# Patient Record
Sex: Male | Born: 1955 | Race: White | Hispanic: No | Marital: Single | Smoking: Never smoker
Health system: Southern US, Community
[De-identification: ages and names within clinical notes are randomized; demographics above are authoritative.]

## PROBLEM LIST (undated history)

## (undated) DIAGNOSIS — I10 Essential (primary) hypertension: Secondary | ICD-10-CM

## (undated) DIAGNOSIS — E119 Type 2 diabetes mellitus without complications: Secondary | ICD-10-CM

## (undated) HISTORY — PX: ABDOMINAL SURGERY: SHX537

---

## 2010-06-21 ENCOUNTER — Ambulatory Visit: Payer: Self-pay | Admitting: Family Medicine

## 2010-07-26 ENCOUNTER — Ambulatory Visit: Payer: Self-pay | Admitting: Family Medicine

## 2010-07-26 ENCOUNTER — Encounter (INDEPENDENT_AMBULATORY_CARE_PROVIDER_SITE_OTHER): Payer: Self-pay | Admitting: *Deleted

## 2011-01-10 NOTE — Letter (Signed)
Summary: Pre Visit Letter Revised  [facility]  [address] [place], [place] [address]   Phone: [phone]  Fax: [phone]    07/26/2010 MRN: [id number]  Fernando Rhodes [address] [place], [place]  [address]              Procedure Date:  09/15/2010    Welcome to the Gastroenterology Division at [facility].    You are scheduled to see a nurse for your pre-procedure visit on 09/05/2010 at 10:30AM on the 3rd floor at [facility], [address]  We ask that you try to arrive at our office 15 minutes prior to your appointment time to allow for check-in.  Please take a minute to review the attached form.  If you answer "Yes" to one or more of the questions on the first page, we ask that you call the person listed at your earliest opportunity.  If you answer "No" to all of the questions, please complete the rest of the form and bring it to your appointment.    Your nurse visit will consist of discussing your medical and surgical history, your immediate family medical history, and your medications.    If you are unable to list all of your medications on the form, please bring the medication bottles to your appointment and we will list them.  We will need to be aware of both prescribed and over the counter drugs.  We will need to know exact dosage information as well.    Please be prepared to read and sign documents such as consent forms, a financial agreement, and acknowledgement forms.  If necessary, and with your consent, a friend or relative is welcome to sit-in on the nurse visit with you.  Please bring your insurance card so that we may make a copy of it.  If your insurance requires a referral to see a specialist, please bring your referral form from your primary care physician.  No co-pay is required for this nurse visit.     If you cannot keep your appointment, please call [phone] to cancel or reschedule prior to your appointment date.  This allows  [place] the opportunity to schedule an appointment for another patient in need of care.   Thank you for choosing [facility] for your medical needs.  We appreciate the opportunity to care for you.  Please visit [place] at our website  to learn more about our practice.                     Sincerely,  The Gastroenterology Division

## 2011-07-30 ENCOUNTER — Other Ambulatory Visit: Payer: Self-pay | Admitting: Family Medicine

## 2015-05-08 ENCOUNTER — Emergency Department (INDEPENDENT_AMBULATORY_CARE_PROVIDER_SITE_OTHER)
Admission: EM | Admit: 2015-05-08 | Discharge: 2015-05-08 | Disposition: A | Discharge status: Home / Self Care | Payer: 59 | Source: Home / Self Care | Attending: Family Medicine | Admitting: Family Medicine

## 2015-05-08 DIAGNOSIS — L0291 Cutaneous abscess, unspecified: Secondary | ICD-10-CM | POA: Diagnosis not present

## 2015-05-08 DIAGNOSIS — L0390 Cellulitis, unspecified: Secondary | ICD-10-CM

## 2015-05-08 MED ORDER — TETANUS-DIPHTH-ACELL PERTUSSIS 5-2.5-18.5 LF-MCG/0.5 IM SUSP
0.5000 mL | Freq: Once | INTRAMUSCULAR | Status: AC
  Administered 2015-05-08: 0.5 mL via INTRAMUSCULAR

## 2015-05-08 MED ORDER — TETANUS-DIPHTH-ACELL PERTUSSIS 5-2.5-18.5 LF-MCG/0.5 IM SUSP
INTRAMUSCULAR | Status: AC
  Filled 2015-05-08: qty 0.5

## 2015-05-08 MED ORDER — CEFTRIAXONE SODIUM 1 G IJ SOLR
INTRAMUSCULAR | Status: AC
  Filled 2015-05-08: qty 10

## 2015-05-08 MED ORDER — CEFTRIAXONE SODIUM 1 G IJ SOLR
1.0000 g | Freq: Once | INTRAMUSCULAR | Status: AC
  Administered 2015-05-08: 1 g via INTRAMUSCULAR

## 2015-05-08 MED ORDER — DOXYCYCLINE HYCLATE 100 MG PO CAPS: 100.0000 mg | ORAL_CAPSULE | Freq: Two times a day (BID) | ORAL | Status: DC

## 2015-05-08 MED ORDER — LIDOCAINE HCL (PF) 2 % IJ SOLN
INTRAMUSCULAR | Status: AC
  Filled 2015-05-08: qty 2

## 2015-05-08 NOTE — Discharge Instructions (Signed)

## 2015-05-08 NOTE — ED Provider Notes (Signed)
CSN: [id number]     Arrival date & time 05/08/15  1411 History   First MD Initiated Contact with Patient 05/08/15 1600     No chief complaint on file.  (Consider location/radiation/quality/duration/timing/severity/associated sxs/prior Treatment) Patient is a 59 y.o. male presenting with abscess. The history is provided by the patient and the spouse.  Abscess Location:  Hand Hand abscess location:  L hand Abscess quality: fluctuance, painful, redness and warmth   Abscess quality: no induration   Red streaking: no   Duration:  4 days Progression:  Worsening Pain details:    Quality:  Throbbing   Severity:  Moderate   Timing:  Constant Chronicity:  New Context: skin injury   Context: not diabetes and not immunosuppression   Relieved by:  None tried Worsened by:  Nothing tried Ineffective treatments:  None tried Associated symptoms: anorexia   Associated symptoms: no fatigue, no fever, no headaches, no nausea and no vomiting   Risk factors: no family hx of MRSA, no hx of MRSA and no prior abscess     No past medical history on file. No past surgical history on file. No family history on file. History  Substance Use Topics  . Smoking status: Not on file  . Smokeless tobacco: Not on file  . Alcohol Use: Not on file    Review of Systems  Constitutional: Negative for fever and fatigue.  Gastrointestinal: Positive for anorexia. Negative for nausea and vomiting.  Neurological: Negative for headaches.    Allergies  Review of patient's allergies indicates not on file.  Home Medications   Prior to Admission medications   Medication Sig Start Date End Date Taking? Authorizing Provider  doxycycline (VIBRAMYCIN) 100 MG capsule Take 1 capsule (100 mg total) by mouth 2 (two) times daily. 05/08/15   Tereasa Coop, PA-C  lisinopril-hydrochlorothiazide (PRINZIDE,ZESTORETIC) 20-12.5 MG per tablet TAKE ONE TABLET BY MOUTH EVERY DAY 07/30/11   Denita Lung, MD   There were no vitals  taken for this visit. Physical Exam  Constitutional: He is oriented to person, place, and time. He appears well-developed and well-nourished. No distress.  Cardiovascular: Normal rate and regular rhythm.   Pulmonary/Chest: Effort normal.  Abdominal: Soft.  Musculoskeletal:       Left hand: He exhibits normal range of motion and normal capillary refill. Normal sensation noted. Normal strength noted. He exhibits no finger abduction, no thumb/finger opposition and no wrist extension trouble.       Hands: Neurological: He is alert and oriented to person, place, and time.  Skin: Skin is warm and dry. He is not diaphoretic. There is erythema.  Psychiatric: He has a normal mood and affect.    ED Course  Procedures (including critical care time) Labs Review Labs Reviewed - No data to display  Imaging Review No results found.   MDM   1. Cellulitis and abscess    Patient with abscess and cellulitis of left middle finger.  He feels well and denies tenderness over the erythematous area. Will treat with 1 gram rocephin, doxy and Tdap.  He is to folow up with his PCP in two days for this problem. Philis Fendt, MS, PA-C   4:17 PM, 05/08/2015       Tereasa Coop, PA-C 05/08/15 [phone]

## 2016-03-16 ENCOUNTER — Ambulatory Visit (HOSPITAL_COMMUNITY)
Admission: EM | Admit: 2016-03-16 | Discharge: 2016-03-16 | Disposition: A | Discharge status: Home / Self Care | Payer: BLUE CROSS/BLUE SHIELD | Attending: Emergency Medicine | Admitting: Emergency Medicine

## 2016-03-16 ENCOUNTER — Encounter (HOSPITAL_COMMUNITY): Payer: Self-pay | Admitting: Emergency Medicine

## 2016-03-16 DIAGNOSIS — M7582 Other shoulder lesions, left shoulder: Secondary | ICD-10-CM

## 2016-03-16 DIAGNOSIS — M778 Other enthesopathies, not elsewhere classified: Secondary | ICD-10-CM

## 2016-03-16 HISTORY — DX: Essential (primary) hypertension: I10

## 2016-03-16 MED ORDER — DICLOFENAC SODIUM 1 % TD GEL: 2.0000 g | Freq: Four times a day (QID) | TRANSDERMAL | Status: DC

## 2016-03-16 MED ORDER — MELOXICAM 15 MG PO TABS: 15.0000 mg | ORAL_TABLET | Freq: Every day | ORAL | Status: DC

## 2016-03-16 MED ORDER — KETOROLAC TROMETHAMINE 60 MG/2ML IM SOLN
INTRAMUSCULAR | Status: AC
  Filled 2016-03-16: qty 2

## 2016-03-16 MED ORDER — KETOROLAC TROMETHAMINE 60 MG/2ML IM SOLN
60.0000 mg | Freq: Once | INTRAMUSCULAR | Status: AC
  Administered 2016-03-16: 60 mg via INTRAMUSCULAR

## 2016-03-16 MED ORDER — TRAMADOL HCL 50 MG PO TABS: 50.0000 mg | ORAL_TABLET | Freq: Four times a day (QID) | ORAL | Status: DC | PRN

## 2016-03-16 NOTE — ED Notes (Signed)
Pt has been suffering from left upper arm pain for a month.  Pt denies any injury to the arm.  He has limited ROM, but does not have issue with lifting things.  He has been using "an arthritis cream" on it with no relief.

## 2016-03-16 NOTE — ED Provider Notes (Signed)
CSN: [id number]     Arrival date & time 03/16/16  1740 History   First MD Initiated Contact with Patient 03/16/16 1813     Chief Complaint  Patient presents with  . Arm Pain   (Consider location/radiation/quality/duration/timing/severity/associated sxs/prior Treatment) HPI He is a 60 year old man here with his fiance for evaluation of left upper arm pain. He states this is been present for about a month now. It is not getting better or worse. It is located in the lateral upper arm. It is worse with overhead movements. He denies any pain in the shoulder itself or the elbow. No pain with elbow movement. No erythema or swelling. He has been using an over-the-counter arthritis cream without improvement. He denies any injury or trauma. No recent change in activity.  Past Medical History  Diagnosis Date  . Hypertension    Past Surgical History  Procedure Laterality Date  . Abdominal surgery     History reviewed. No pertinent family history. Social History  Substance Use Topics  . Smoking status: Never Smoker   . Smokeless tobacco: Never Used  . Alcohol Use: No    Review of Systems As in history of present illness Allergies  Review of patient's allergies indicates no known allergies.  Home Medications   Prior to Admission medications   Medication Sig Start Date End Date Taking? Authorizing Provider  lisinopril-hydrochlorothiazide (PRINZIDE,ZESTORETIC) 20-12.5 MG per tablet TAKE ONE TABLET BY MOUTH EVERY DAY 07/30/11  Yes Denita Lung, MD  diclofenac sodium (VOLTAREN) 1 % GEL Apply 2 g topically 4 (four) times daily. Left upper arm 03/16/16   Melony Overly, MD  meloxicam (MOBIC) 15 MG tablet Take 1 tablet (15 mg total) by mouth daily. For 1 week, then as needed for pain 03/16/16   Melony Overly, MD  traMADol (ULTRAM) 50 MG tablet Take 1 tablet (50 mg total) by mouth every 6 (six) hours as needed. 03/16/16   Melony Overly, MD   Meds Ordered and Administered this Visit   Medications   ketorolac (TORADOL) injection 60 mg (not administered)    BP 134/88 mmHg  Pulse 69  Temp(Src) 97.7 F (36.5 C) (Oral)  Resp 16  SpO2 95% No data found.   Physical Exam  Constitutional: He is oriented to person, place, and time. He appears well-developed. No distress.  Cardiovascular: Normal rate.   Pulmonary/Chest: Effort normal.  Musculoskeletal:  Left shoulder: No erythema or edema. No bony tenderness. No point tenderness within the shoulder. Positive empty can. Negative Hawkins. Normal range of motion, but pain with abduction beyond 90. Left arm: No erythema or swelling. No bruising. He is tender at the site of the deltoid insertion. 2+ radial pulse. Left elbow: No swelling or erythema. Full active range of motion without pain. No tenderness of the epicondyles.  Neurological: He is alert and oriented to person, place, and time.    ED Course  Procedures (including critical care time)  Labs Review Labs Reviewed - No data to display  Imaging Review No results found.    MDM   1. Deltoid tendonitis of left shoulder    Symptomatic treatment with relative rest, meloxicam, diclofenac gel. Prescription given for tramadol to use as needed for severe pain. Toradol given prior to discharge. Follow-up with orthopedics if not improving in 1-2 weeks.    Melony Overly, MD 03/16/16 [phone]

## 2016-03-16 NOTE — Discharge Instructions (Signed)
You have tendinitis of the deltoid ligament. Take meloxicam daily for the next week, then as needed. Start this medicine tomorrow. Put diclofenac gel on the arm 4 times a day for the next week. Avoid movements that cause pain. You should see some improvement by Monday or Tuesday, but this will likely take several weeks to fully resolve. If it is not improving in 1-2 weeks, follow-up with Dr. Percell Miller, an orthopedic specialist.

## 2016-07-27 DIAGNOSIS — I10 Essential (primary) hypertension: Secondary | ICD-10-CM | POA: Diagnosis not present

## 2016-07-27 DIAGNOSIS — C4491 Basal cell carcinoma of skin, unspecified: Secondary | ICD-10-CM | POA: Diagnosis not present

## 2016-07-27 DIAGNOSIS — R7309 Other abnormal glucose: Secondary | ICD-10-CM | POA: Diagnosis not present

## 2016-08-31 DIAGNOSIS — R7989 Other specified abnormal findings of blood chemistry: Secondary | ICD-10-CM | POA: Diagnosis not present

## 2016-08-31 DIAGNOSIS — R945 Abnormal results of liver function studies: Secondary | ICD-10-CM | POA: Diagnosis not present

## 2016-09-16 DIAGNOSIS — Z23 Encounter for immunization: Secondary | ICD-10-CM | POA: Diagnosis not present

## 2016-11-06 DIAGNOSIS — Z79899 Other long term (current) drug therapy: Secondary | ICD-10-CM | POA: Diagnosis not present

## 2016-11-06 DIAGNOSIS — E784 Other hyperlipidemia: Secondary | ICD-10-CM | POA: Diagnosis not present

## 2016-11-06 DIAGNOSIS — N183 Chronic kidney disease, stage 3 (moderate): Secondary | ICD-10-CM | POA: Diagnosis not present

## 2016-11-06 DIAGNOSIS — Z23 Encounter for immunization: Secondary | ICD-10-CM | POA: Diagnosis not present

## 2016-11-06 DIAGNOSIS — E119 Type 2 diabetes mellitus without complications: Secondary | ICD-10-CM | POA: Diagnosis not present

## 2016-11-27 ENCOUNTER — Ambulatory Visit (HOSPITAL_COMMUNITY)
Admission: EM | Admit: 2016-11-27 | Discharge: 2016-11-27 | Disposition: A | Discharge status: Home / Self Care | Payer: BLUE CROSS/BLUE SHIELD | Attending: Family Medicine | Admitting: Family Medicine

## 2016-11-27 ENCOUNTER — Encounter (HOSPITAL_COMMUNITY): Payer: Self-pay | Admitting: Emergency Medicine

## 2016-11-27 DIAGNOSIS — R197 Diarrhea, unspecified: Secondary | ICD-10-CM

## 2016-11-27 DIAGNOSIS — K529 Noninfective gastroenteritis and colitis, unspecified: Secondary | ICD-10-CM | POA: Diagnosis not present

## 2016-11-27 DIAGNOSIS — R112 Nausea with vomiting, unspecified: Secondary | ICD-10-CM

## 2016-11-27 HISTORY — DX: Type 2 diabetes mellitus without complications: E11.9

## 2016-11-27 MED ORDER — ONDANSETRON HCL 4 MG PO TABS: 4.0000 mg | ORAL_TABLET | Freq: Four times a day (QID) | ORAL | 0 refills | Status: DC

## 2016-11-27 MED ORDER — SODIUM CHLORIDE 0.9 % IV SOLN
Freq: Once | INTRAVENOUS | Status: AC
  Administered 2016-11-27 (×2): via INTRAVENOUS

## 2016-11-27 MED ORDER — HYDROMORPHONE HCL 1 MG/ML IJ SOLN: 1.0000 mg | Freq: Once | INTRAMUSCULAR | Status: DC

## 2016-11-27 MED ORDER — HYDROMORPHONE HCL 1 MG/ML IJ SOLN
1.0000 mg | Freq: Once | INTRAMUSCULAR | Status: AC
  Administered 2016-11-27: 1 mg via INTRAVENOUS

## 2016-11-27 MED ORDER — HYDROMORPHONE HCL 1 MG/ML IJ SOLN
INTRAMUSCULAR | Status: AC
  Filled 2016-11-27: qty 1

## 2016-11-27 MED ORDER — ONDANSETRON HCL 4 MG/2ML IJ SOLN
INTRAMUSCULAR | Status: AC
  Filled 2016-11-27: qty 2

## 2016-11-27 MED ORDER — ONDANSETRON HCL 4 MG/2ML IJ SOLN
4.0000 mg | Freq: Once | INTRAMUSCULAR | Status: AC
  Administered 2016-11-27: 4 mg via INTRAVENOUS

## 2016-11-27 NOTE — ED Triage Notes (Signed)
Vomiting and diarrhea started yesterday.  Patient feels like he is going to pass out.  One episode of vomiting and one episode of diarrhea today.  Has vomited throughout the night and had diarrhea.

## 2016-11-27 NOTE — ED Provider Notes (Signed)
[facility]    CSN: [id number] Arrival date & time: 11/27/16  1133     History   Chief Complaint Chief Complaint  Patient presents with  . Abdominal Pain    HPI Fernando Rhodes is a 60 y.o. male.   The history is provided by the patient and the spouse.  Abdominal Pain  Pain location:  Epigastric Pain quality: cramping   Pain radiates to:  Does not radiate Pain severity:  Mild Onset quality:  Sudden Duration:  1 day Progression:  Unchanged Chronicity:  New Context: not diet changes, not sick contacts and not suspicious food intake   Relieved by:  None tried Worsened by:  Nothing Ineffective treatments:  None tried Associated symptoms: diarrhea, nausea and vomiting   Associated symptoms: no fever, no hematemesis, no hematochezia and no hematuria     Past Medical History:  Diagnosis Date  . Diabetes mellitus without complication ([place])   . Hypertension     There are no active problems to display for this patient.   Past Surgical History:  Procedure Laterality Date  . ABDOMINAL SURGERY         Home Medications    Prior to Admission medications   Medication Sig Start Date End Date Taking? Authorizing Provider  simvastatin (ZOCOR) 20 MG tablet Take 20 mg by mouth daily.   Yes Historical Provider, MD  diclofenac sodium (VOLTAREN) 1 % GEL Apply 2 g topically 4 (four) times daily. Left upper arm 03/16/16   Melony Overly, MD  lisinopril-hydrochlorothiazide (PRINZIDE,ZESTORETIC) 20-12.5 MG per tablet TAKE ONE TABLET BY MOUTH EVERY DAY 07/30/11   Denita Lung, MD  meloxicam (MOBIC) 15 MG tablet Take 1 tablet (15 mg total) by mouth daily. For 1 week, then as needed for pain 03/16/16   Melony Overly, MD  traMADol (ULTRAM) 50 MG tablet Take 1 tablet (50 mg total) by mouth every 6 (six) hours as needed. 03/16/16   Melony Overly, MD    Family History No family history on file.  Social History Social History  Substance Use Topics  . Smoking status: Never  Smoker  . Smokeless tobacco: Never Used  . Alcohol use No     Allergies   Patient has no known allergies.   Review of Systems Review of Systems  Constitutional: Negative.  Negative for fever.  HENT: Negative.   Respiratory: Negative.   Cardiovascular: Negative.   Gastrointestinal: Positive for abdominal pain, diarrhea, nausea and vomiting. Negative for blood in stool, hematemesis and hematochezia.  Genitourinary: Negative.  Negative for hematuria.  Musculoskeletal: Negative.   All other systems reviewed and are negative.    Physical Exam Triage Vital Signs ED Triage Vitals  Enc Vitals Group     BP 11/27/16 1205 131/84     Pulse Rate 11/27/16 1205 90     Resp 11/27/16 1205 24     Temp --      Temp src --      SpO2 11/27/16 1205 100 %     Weight --      Height --      Head Circumference --      Peak Flow --      Pain Score 11/27/16 1204 10     Pain Loc --      Pain Edu? --      Excl. in [place]? --    No data found.   Updated Vital Signs BP 131/84 (BP Location: Right Arm)   Pulse  90   Resp 24   SpO2 100%   Visual Acuity Right Eye Distance:   Left Eye Distance:   Bilateral Distance:    Right Eye Near:   Left Eye Near:    Bilateral Near:     Physical Exam  Constitutional: He is oriented to person, place, and time. He appears well-developed and well-nourished. No distress.  HENT:  Right Ear: External ear normal.  Left Ear: External ear normal.  Mouth/Throat: Oropharynx is clear and moist.  Neck: Normal range of motion. Neck supple.  Abdominal: Soft. He exhibits no distension and no mass. Bowel sounds are increased. There is tenderness in the epigastric area. There is CVA tenderness. There is no rebound, no guarding, no tenderness at McBurney's point and negative Murphy's sign. No hernia.  Musculoskeletal: Normal range of motion.  Lymphadenopathy:    He has no cervical adenopathy.  Neurological: He is alert and oriented to person, place, and time.  Skin:  Skin is warm and dry.  Nursing note and vitals reviewed.    UC Treatments / Results  Labs (all labs ordered are listed, but only abnormal results are displayed) Labs Reviewed - No data to display  EKG  EKG Interpretation None       Radiology No results found.  Procedures Procedures (including critical care time)  Medications Ordered in UC Medications  0.9 %  sodium chloride infusion (not administered)  ondansetron (ZOFRAN) injection 4 mg (not administered)     Initial Impression / Assessment and Plan / UC Course  I have reviewed the triage vital signs and the nursing notes.  Pertinent labs & imaging results that were available during my care of the patient were reviewed by me and considered in my medical decision making (see chart for details).  Clinical Course     Sx improved at d/c after fluids and meds. Desires d/c.  Final Clinical Impressions(s) / UC Diagnoses   Final diagnoses:  None    New Prescriptions New Prescriptions   No medications on file     Billy Fischer, MD 11/28/16 2052

## 2016-11-27 NOTE — Discharge Instructions (Signed)
Clear liquid , bland diet tonight as tolerated, advance on tues as improved, use medicine as needed, return or see your doctor if any problems. °

## 2017-01-29 DIAGNOSIS — I10 Essential (primary) hypertension: Secondary | ICD-10-CM | POA: Diagnosis not present

## 2017-01-29 DIAGNOSIS — E119 Type 2 diabetes mellitus without complications: Secondary | ICD-10-CM | POA: Diagnosis not present

## 2017-01-29 DIAGNOSIS — E785 Hyperlipidemia, unspecified: Secondary | ICD-10-CM | POA: Diagnosis not present

## 2017-05-29 DIAGNOSIS — E119 Type 2 diabetes mellitus without complications: Secondary | ICD-10-CM | POA: Diagnosis not present

## 2017-05-29 DIAGNOSIS — I10 Essential (primary) hypertension: Secondary | ICD-10-CM | POA: Diagnosis not present

## 2017-05-29 DIAGNOSIS — E785 Hyperlipidemia, unspecified: Secondary | ICD-10-CM | POA: Diagnosis not present

## 2017-08-30 DIAGNOSIS — I10 Essential (primary) hypertension: Secondary | ICD-10-CM | POA: Diagnosis not present

## 2017-08-30 DIAGNOSIS — E785 Hyperlipidemia, unspecified: Secondary | ICD-10-CM | POA: Diagnosis not present

## 2017-08-30 DIAGNOSIS — Z23 Encounter for immunization: Secondary | ICD-10-CM | POA: Diagnosis not present

## 2017-08-30 DIAGNOSIS — E119 Type 2 diabetes mellitus without complications: Secondary | ICD-10-CM | POA: Diagnosis not present

## 2018-01-03 DIAGNOSIS — M545 Low back pain: Secondary | ICD-10-CM | POA: Diagnosis not present

## 2018-01-03 DIAGNOSIS — M170 Bilateral primary osteoarthritis of knee: Secondary | ICD-10-CM | POA: Diagnosis not present

## 2018-01-03 DIAGNOSIS — M25552 Pain in left hip: Secondary | ICD-10-CM | POA: Diagnosis not present

## 2018-03-07 DIAGNOSIS — E119 Type 2 diabetes mellitus without complications: Secondary | ICD-10-CM | POA: Diagnosis not present

## 2018-03-07 DIAGNOSIS — Z0000 Encounter for general adult medical examination without abnormal findings: Secondary | ICD-10-CM | POA: Diagnosis not present

## 2018-03-07 DIAGNOSIS — Z125 Encounter for screening for malignant neoplasm of prostate: Secondary | ICD-10-CM | POA: Diagnosis not present

## 2018-03-07 DIAGNOSIS — E785 Hyperlipidemia, unspecified: Secondary | ICD-10-CM | POA: Diagnosis not present

## 2018-09-11 DIAGNOSIS — I10 Essential (primary) hypertension: Secondary | ICD-10-CM | POA: Diagnosis not present

## 2018-09-11 DIAGNOSIS — Z23 Encounter for immunization: Secondary | ICD-10-CM | POA: Diagnosis not present

## 2018-09-11 DIAGNOSIS — E785 Hyperlipidemia, unspecified: Secondary | ICD-10-CM | POA: Diagnosis not present

## 2018-09-11 DIAGNOSIS — E119 Type 2 diabetes mellitus without complications: Secondary | ICD-10-CM | POA: Diagnosis not present

## 2019-06-20 DIAGNOSIS — E785 Hyperlipidemia, unspecified: Secondary | ICD-10-CM | POA: Diagnosis not present

## 2019-06-20 DIAGNOSIS — Z125 Encounter for screening for malignant neoplasm of prostate: Secondary | ICD-10-CM | POA: Diagnosis not present

## 2019-06-20 DIAGNOSIS — R5383 Other fatigue: Secondary | ICD-10-CM | POA: Diagnosis not present

## 2019-06-20 DIAGNOSIS — E119 Type 2 diabetes mellitus without complications: Secondary | ICD-10-CM | POA: Diagnosis not present

## 2019-06-20 DIAGNOSIS — I10 Essential (primary) hypertension: Secondary | ICD-10-CM | POA: Diagnosis not present

## 2020-05-24 ENCOUNTER — Ambulatory Visit (INDEPENDENT_AMBULATORY_CARE_PROVIDER_SITE_OTHER): Payer: 59 | Admitting: Orthopedic Surgery

## 2020-05-24 ENCOUNTER — Ambulatory Visit (INDEPENDENT_AMBULATORY_CARE_PROVIDER_SITE_OTHER): Payer: 59

## 2020-05-24 ENCOUNTER — Other Ambulatory Visit: Payer: Self-pay

## 2020-05-24 DIAGNOSIS — M79672 Pain in left foot: Secondary | ICD-10-CM

## 2020-05-24 DIAGNOSIS — M1A0720 Idiopathic chronic gout, left ankle and foot, without tophus (tophi): Secondary | ICD-10-CM

## 2020-05-25 ENCOUNTER — Encounter: Payer: Self-pay | Admitting: Orthopedic Surgery

## 2020-05-25 ENCOUNTER — Other Ambulatory Visit: Payer: Self-pay | Admitting: Orthopedic Surgery

## 2020-05-25 LAB — URIC ACID: Uric Acid, Serum: 6.8 mg/dL (ref 4.0–8.0)

## 2020-05-25 MED ORDER — ALLOPURINOL 100 MG PO TABS: 100.0000 mg | ORAL_TABLET | Freq: Every day | ORAL | 3 refills | Status: DC

## 2020-05-25 MED ORDER — COLCHICINE 0.6 MG PO CAPS: 0.6000 mg | ORAL_CAPSULE | Freq: Every day | ORAL | 1 refills | Status: DC | PRN

## 2020-05-25 NOTE — Progress Notes (Signed)
Office Visit Note   Patient: Fernando Rhodes           Date of Birth: 1956/03/08           MRN: [id number] Visit Date: 05/24/2020              Requested by: Antony Blackbird, MD [place],  [place] [address] PCP: Antony Blackbird, MD  Chief Complaint  Patient presents with  . Left Foot - Pain      HPI: Patient is a 64 year old gentleman who states that he developed an acute onset of pain in his left foot with redness and swelling.  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  Assessment & Plan: Visit Diagnoses:  1. Pain in left foot   2. Idiopathic chronic gout of left foot without tophus     Plan: With patient's acute onset of pain and redness symptoms are most likely from gout we will draw a uric acid we will start him on colchicine and allopurinol follow-up in 3 weeks.  Plan for a low maintenance dose of allopurinol once a day.  Follow-Up Instructions: Return in about 3 weeks (around 06/14/2020).   Ortho Exam  Patient is alert, oriented, no adenopathy, well-dressed, normal affect, normal respiratory effort. Examination patient does have a cavovarus foot with plantarflexed first ray's.  Patient has no subtalar motion is limited ankle motion.  There are no plantar ulcers no cellulitis no signs of infection.  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  Patient has good capillary refill no ischemic changes.  Imaging: No results found. No images are attached to the encounter.  Labs: Lab Results  Component Value Date   LABURIC 6.8 05/24/2020     Lab Results  Component Value Date   LABURIC  6.8 05/24/2020    No results found for: MG No results found for: VD25OH  No results found for: PREALBUMIN No flowsheet data found.   There is no height or weight on file to calculate BMI.  Orders:  Orders Placed This Encounter  Procedures  . XR Foot 2 Views Left  . Uric acid   No orders of the defined types were placed in this encounter.    Procedures: No procedures performed  Clinical Data: No additional findings.  ROS:  All other systems negative, except as noted in the HPI. Review of Systems  Objective: Vital Signs: There were no vitals taken for this visit.  Specialty Comments:  No specialty comments available.  PMFS History: There are no problems to display for this patient.  Past Medical History:  Diagnosis Date  . Diabetes mellitus without complication ([place])   . Hypertension     History reviewed. No pertinent family history.  Past Surgical History:  Procedure Laterality Date  . ABDOMINAL SURGERY     Social History   Occupational History  . Not on file  Tobacco Use  . Smoking status: Never Smoker  . Smokeless tobacco: Never Used  Substance and Sexual Activity  . Alcohol use: No  . Drug use: No  . Sexual activity:  Not on file

## 2020-05-26 ENCOUNTER — Telehealth: Payer: Self-pay

## 2020-05-26 NOTE — Telephone Encounter (Signed)
Patient brother called in wanting to get blood test results

## 2020-05-26 NOTE — Telephone Encounter (Signed)
I called and advised that uric acid level was elevated and pt does have gout. Advised to take his allopurinol 100 mg qd and colchicine as needed for flare up pain. Advised that we will recheck his labs at his next visit and to call with any questions.

## 2020-05-27 ENCOUNTER — Other Ambulatory Visit: Payer: Self-pay

## 2020-05-27 ENCOUNTER — Inpatient Hospital Stay (HOSPITAL_COMMUNITY)
Admission: EM | Admit: 2020-05-27 | Discharge: 2020-06-04 | DRG: 871 | Disposition: A | Discharge status: Home / Self Care | Payer: 59 | Attending: Student | Admitting: Student

## 2020-05-27 ENCOUNTER — Encounter (HOSPITAL_COMMUNITY): Payer: Self-pay | Admitting: Emergency Medicine

## 2020-05-27 ENCOUNTER — Emergency Department (HOSPITAL_COMMUNITY): Payer: 59

## 2020-05-27 DIAGNOSIS — E1169 Type 2 diabetes mellitus with other specified complication: Secondary | ICD-10-CM

## 2020-05-27 DIAGNOSIS — K529 Noninfective gastroenteritis and colitis, unspecified: Secondary | ICD-10-CM | POA: Diagnosis present

## 2020-05-27 DIAGNOSIS — I82412 Acute embolism and thrombosis of left femoral vein: Secondary | ICD-10-CM | POA: Diagnosis present

## 2020-05-27 DIAGNOSIS — M109 Gout, unspecified: Secondary | ICD-10-CM | POA: Diagnosis present

## 2020-05-27 DIAGNOSIS — E1151 Type 2 diabetes mellitus with diabetic peripheral angiopathy without gangrene: Secondary | ICD-10-CM | POA: Diagnosis present

## 2020-05-27 DIAGNOSIS — A419 Sepsis, unspecified organism: Principal | ICD-10-CM | POA: Diagnosis present

## 2020-05-27 DIAGNOSIS — N289 Disorder of kidney and ureter, unspecified: Secondary | ICD-10-CM

## 2020-05-27 DIAGNOSIS — Z79899 Other long term (current) drug therapy: Secondary | ICD-10-CM

## 2020-05-27 DIAGNOSIS — D1803 Hemangioma of intra-abdominal structures: Secondary | ICD-10-CM | POA: Diagnosis present

## 2020-05-27 DIAGNOSIS — I70209 Unspecified atherosclerosis of native arteries of extremities, unspecified extremity: Secondary | ICD-10-CM | POA: Diagnosis present

## 2020-05-27 DIAGNOSIS — K750 Abscess of liver: Secondary | ICD-10-CM | POA: Diagnosis present

## 2020-05-27 DIAGNOSIS — I2692 Saddle embolus of pulmonary artery without acute cor pulmonale: Secondary | ICD-10-CM | POA: Diagnosis present

## 2020-05-27 DIAGNOSIS — Z8739 Personal history of other diseases of the musculoskeletal system and connective tissue: Secondary | ICD-10-CM

## 2020-05-27 DIAGNOSIS — K5732 Diverticulitis of large intestine without perforation or abscess without bleeding: Secondary | ICD-10-CM | POA: Diagnosis present

## 2020-05-27 DIAGNOSIS — Z20822 Contact with and (suspected) exposure to covid-19: Secondary | ICD-10-CM | POA: Diagnosis present

## 2020-05-27 DIAGNOSIS — I10 Essential (primary) hypertension: Secondary | ICD-10-CM | POA: Diagnosis present

## 2020-05-27 DIAGNOSIS — R6521 Severe sepsis with septic shock: Secondary | ICD-10-CM | POA: Diagnosis present

## 2020-05-27 DIAGNOSIS — N179 Acute kidney failure, unspecified: Secondary | ICD-10-CM | POA: Diagnosis present

## 2020-05-27 DIAGNOSIS — I2699 Other pulmonary embolism without acute cor pulmonale: Secondary | ICD-10-CM | POA: Diagnosis present

## 2020-05-27 DIAGNOSIS — E119 Type 2 diabetes mellitus without complications: Secondary | ICD-10-CM

## 2020-05-27 DIAGNOSIS — Z86711 Personal history of pulmonary embolism: Secondary | ICD-10-CM | POA: Diagnosis present

## 2020-05-27 DIAGNOSIS — Z79891 Long term (current) use of opiate analgesic: Secondary | ICD-10-CM

## 2020-05-27 DIAGNOSIS — K5792 Diverticulitis of intestine, part unspecified, without perforation or abscess without bleeding: Secondary | ICD-10-CM | POA: Diagnosis present

## 2020-05-27 DIAGNOSIS — O2230 Deep phlebothrombosis in pregnancy, unspecified trimester: Secondary | ICD-10-CM

## 2020-05-27 DIAGNOSIS — E1165 Type 2 diabetes mellitus with hyperglycemia: Secondary | ICD-10-CM | POA: Diagnosis present

## 2020-05-27 LAB — CBC WITH DIFFERENTIAL/PLATELET
Abs Immature Granulocytes: 0.13 10*3/uL — ABNORMAL HIGH (ref 0.00–0.07)
Basophils Absolute: 0 10*3/uL (ref 0.0–0.1)
Basophils Relative: 0 %
Eosinophils Absolute: 0 10*3/uL (ref 0.0–0.5)
Eosinophils Relative: 0 %
HCT: 40.8 % (ref 39.0–52.0)
Hemoglobin: 13.7 g/dL (ref 13.0–17.0)
Immature Granulocytes: 1 %
Lymphocytes Relative: 3 %
Lymphs Abs: 0.6 10*3/uL — ABNORMAL LOW (ref 0.7–4.0)
MCH: 31.8 pg (ref 26.0–34.0)
MCHC: 33.6 g/dL (ref 30.0–36.0)
MCV: 94.7 fL (ref 80.0–100.0)
Monocytes Absolute: 0.9 10*3/uL (ref 0.1–1.0)
Monocytes Relative: 5 %
Neutro Abs: 16.7 10*3/uL — ABNORMAL HIGH (ref 1.7–7.7)
Neutrophils Relative %: 91 %
Platelets: 156 10*3/uL (ref 150–400)
RBC: 4.31 MIL/uL (ref 4.22–5.81)
RDW: 12 % (ref 11.5–15.5)
WBC: 18.4 10*3/uL — ABNORMAL HIGH (ref 4.0–10.5)
nRBC: 0 % (ref 0.0–0.2)

## 2020-05-27 LAB — COMPREHENSIVE METABOLIC PANEL
ALT: 44 U/L (ref 0–44)
AST: 35 U/L (ref 15–41)
Albumin: 3.1 g/dL — ABNORMAL LOW (ref 3.5–5.0)
Alkaline Phosphatase: 98 U/L (ref 38–126)
Anion gap: 15 (ref 5–15)
BUN: 28 mg/dL — ABNORMAL HIGH (ref 8–23)
CO2: 18 mmol/L — ABNORMAL LOW (ref 22–32)
Calcium: 8.5 mg/dL — ABNORMAL LOW (ref 8.9–10.3)
Chloride: 101 mmol/L (ref 98–111)
Creatinine, Ser: 1.71 mg/dL — ABNORMAL HIGH (ref 0.61–1.24)
GFR calc Af Amer: 49 mL/min — ABNORMAL LOW (ref >60)
GFR calc non Af Amer: 42 mL/min — ABNORMAL LOW (ref >60)
Glucose, Bld: 273 mg/dL — ABNORMAL HIGH (ref 70–99)
Potassium: 3.5 mmol/L (ref 3.5–5.1)
Sodium: 134 mmol/L — ABNORMAL LOW (ref 135–145)
Total Bilirubin: 1.5 mg/dL — ABNORMAL HIGH (ref 0.3–1.2)
Total Protein: 6.3 g/dL — ABNORMAL LOW (ref 6.5–8.1)

## 2020-05-27 LAB — LACTIC ACID, PLASMA: Lactic Acid, Venous: 4 mmol/L (ref 0.5–1.9)

## 2020-05-27 NOTE — ED Triage Notes (Signed)
  Patient BIB EMS after not feeling well after dinner.  Wife checked vitals and felt like his BP was low so she called EMS.  Patient was 110/60 on arrival.  Temp 103.6 and given 1000 mg of tylenol.  No COVID exposure and has had vaccinations.  No complaints of pain at this time.

## 2020-05-27 NOTE — ED Provider Notes (Signed)
[facility] EMERGENCY DEPARTMENT Provider Note   CSN: [id number] Arrival date & time: 05/27/20  2244     History Chief Complaint  Patient presents with  . Fever  . Fall    Bhavesh [name] Gravlin is a 64 y.o. male.  The history is provided by the patient and medical records.  Fever Fall    64 y.o. M with hx of HTN, DM, presenting to the ED for fever.  Patient states he felt fine most of the day today, but after dinner he started feeling really bad/weak all of sudden.  States he got up and went to the bathroom and had a fall-- states he lost his footing and fell.  No head trauma, no LOC, denies injuries from fall.  Wife checked his BP and felt like it was low so called EMS.  Patient did have fever of 103F.  He was given 1000mg  tylenol by EMS. Patient denies any real symptoms-- no cough, fever, chest pain, SOB, abdominal pain, vomiting, diarrhea, urinary symptoms.  States earlier this week he has felt fine.  Wife without symptoms.  No sick contacts.  No covid exposures.  He has had covid vaccinations.  Past Medical History:  Diagnosis Date  . Diabetes mellitus without complication ([place])   . Hypertension     There are no problems to display for this patient.   Past Surgical History:  Procedure Laterality Date  . ABDOMINAL SURGERY         History reviewed. No pertinent family history.  Social History   Tobacco Use  . Smoking status: Never Smoker  . Smokeless tobacco: Never Used  Substance Use Topics  . Alcohol use: No  . Drug use: No    Home Medications Prior to Admission medications   Medication Sig Start Date End Date Taking? Authorizing Provider  allopurinol (ZYLOPRIM) 100 MG tablet Take 1 tablet (100 mg total) by mouth daily. 05/25/20   Newt Minion, MD  Colchicine 0.6 MG CAPS Take 0.6 mg by mouth daily as needed. 05/25/20   Newt Minion, MD  diclofenac sodium (VOLTAREN) 1 % GEL Apply 2 g topically 4 (four) times daily. Left upper  arm Patient not taking: Reported on 05/24/2020 03/16/16   Melony Overly, MD  lisinopril-hydrochlorothiazide (PRINZIDE,ZESTORETIC) 20-12.5 MG per tablet TAKE ONE TABLET BY MOUTH EVERY DAY 07/30/11   Denita Lung, MD  meloxicam (MOBIC) 15 MG tablet Take 1 tablet (15 mg total) by mouth daily. For 1 week, then as needed for pain Patient not taking: Reported on 05/24/2020 03/16/16   Melony Overly, MD  ondansetron (ZOFRAN) 4 MG tablet Take 1 tablet (4 mg total) by mouth every 6 (six) hours. Prn n/v. Patient not taking: Reported on 05/24/2020 11/27/16   Billy Fischer, MD  simvastatin (ZOCOR) 20 MG tablet Take 20 mg by mouth daily.    [provider]  traMADol (ULTRAM) 50 MG tablet Take 1 tablet (50 mg total) by mouth every 6 (six) hours as needed. Patient not taking: Reported on 05/24/2020 03/16/16   Melony Overly, MD    Allergies    Patient has no known allergies.  Review of Systems   Review of Systems  Constitutional: Positive for fever.  All other systems reviewed and are negative.   Physical Exam Updated Vital Signs BP 104/68 (BP Location: Right Arm)   Pulse 100   Temp 98.4 F (36.9 C) (Oral)   Resp (!) 24   Ht 5\' 9"  (1.753  m)   Wt 81.6 kg   SpO2 95%   BMI 26.58 kg/m   Physical Exam Vitals and nursing note reviewed.  Constitutional:      Appearance: He is well-developed.     Comments: Warm to the touch, clammy  HENT:     Head: Normocephalic and atraumatic.  Eyes:     Conjunctiva/sclera: Conjunctivae normal.     Pupils: Pupils are equal, round, and reactive to light.  Cardiovascular:     Rate and Rhythm: Normal rate and regular rhythm.     Heart sounds: Normal heart sounds.  Pulmonary:     Effort: Pulmonary effort is normal. No respiratory distress.     Breath sounds: No rhonchi.  Abdominal:     General: Bowel sounds are normal.     Palpations: Abdomen is soft.     Tenderness: There is no abdominal tenderness. There is no rebound.     Comments: Soft, non-tender to  palpation  Musculoskeletal:        General: Normal range of motion.     Cervical back: Normal range of motion.  Skin:    General: Skin is warm and dry.  Neurological:     Mental Status: He is alert and oriented to person, place, and time.     ED Results / Procedures / Treatments   Labs (all labs ordered are listed, but only abnormal results are displayed) Labs Reviewed  CBC WITH DIFFERENTIAL/PLATELET - Abnormal; Notable for the following components:      Result Value   WBC 18.4 (*)    Neutro Abs 16.7 (*)    Lymphs Abs 0.6 (*)    Abs Immature Granulocytes 0.13 (*)    All other components within normal limits  COMPREHENSIVE METABOLIC PANEL - Abnormal; Notable for the following components:   Sodium 134 (*)    CO2 18 (*)    Glucose, Bld 273 (*)    BUN 28 (*)    Creatinine, Ser 1.71 (*)    Calcium 8.5 (*)    Total Protein 6.3 (*)    Albumin 3.1 (*)    Total Bilirubin 1.5 (*)    GFR calc non Af Amer 42 (*)    GFR calc Af Amer 49 (*)    All other components within normal limits  LACTIC ACID, PLASMA - Abnormal; Notable for the following components:   Lactic Acid, Venous 4.0 (*)    All other components within normal limits  LACTIC ACID, PLASMA - Abnormal; Notable for the following components:   Lactic Acid, Venous 2.6 (*)    All other components within normal limits  SARS CORONAVIRUS 2 BY RT PCR (HOSPITAL ORDER, [place] [facility])  URINE CULTURE  CULTURE, BLOOD (ROUTINE X 2)  CULTURE, BLOOD (ROUTINE X 2)  LIPASE, BLOOD  CK  URINALYSIS, ROUTINE W REFLEX MICROSCOPIC  HIV ANTIBODY (ROUTINE TESTING W REFLEX)  CBC  LACTIC ACID, PLASMA  LACTIC ACID, PLASMA  BASIC METABOLIC PANEL  HEMOGLOBIN A1C  HEPATIC FUNCTION PANEL  HEPATITIS PANEL, ACUTE    EKG EKG Interpretation  Date/Time:  Thursday May 27 2020 23:10:21 EDT Ventricular Rate:  92 PR Interval:    QRS Duration: 81 QT Interval:  350 QTC Calculation: 433 R Axis:   76 Text  Interpretation: Sinus rhythm Probable left atrial enlargement RSR' in V1 or V2, right VCD or RVH Confirmed by Randal Buba, April ([id number]) on 05/28/2020 1:31:24 AM   Radiology DG Chest Port 1 View  Result Date: 05/27/2020 CLINICAL  DATA:  Fever EXAM: PORTABLE CHEST 1 VIEW COMPARISON:  None. FINDINGS: Single frontal view of the chest demonstrates an unremarkable cardiac silhouette. No airspace disease, effusion, or pneumothorax. No acute bony abnormalities. IMPRESSION: 1. No acute intrathoracic process. Electronically Signed   By: Randa Ngo M.D.   On: 05/27/2020 23:43    Procedures Procedures (including critical care time)  CRITICAL CARE Performed by: Larene Pickett   Total critical care time: 45 minutes  Critical care time was exclusive of separately billable procedures and treating other patients.  Critical care was necessary to treat or prevent imminent or life-threatening deterioration.  Critical care was time spent personally by me on the following activities: development of treatment plan with patient and/or surrogate as well as nursing, discussions with consultants, evaluation of patient's response to treatment, examination of patient, obtaining history from patient or surrogate, ordering and performing treatments and interventions, ordering and review of laboratory studies, ordering and review of radiographic studies, pulse oximetry and re-evaluation of patient's condition.   Medications Ordered in ED Medications  vancomycin (VANCOCIN) IVPB 1000 mg/200 mL premix (has no administration in time range)  norepinephrine (LEVOPHED) 4mg  in 214mL premix infusion (4 mcg/min Intravenous Rate/Dose Change 05/28/20 0253)  docusate sodium (COLACE) capsule 100 mg (has no administration in time range)  polyethylene glycol (MIRALAX / GLYCOLAX) packet 17 g (has no administration in time range)  heparin injection 5,000 Units (has no administration in time range)  insulin aspart (novoLOG) injection  0-15 Units (has no administration in time range)  metroNIDAZOLE (FLAGYL) IVPB 500 mg (has no administration in time range)  vancomycin (VANCOREADY) IVPB 750 mg/150 mL (has no administration in time range)  ceFEPIme (MAXIPIME) 2 g in sodium chloride 0.9 % 100 mL IVPB (has no administration in time range)  metroNIDAZOLE (FLAGYL) IVPB 500 mg (0 mg Intravenous Stopped 05/28/20 0112)  lactated ringers bolus 1,000 mL (0 mLs Intravenous Stopped 05/28/20 0111)    And  lactated ringers bolus 1,000 mL (0 mLs Intravenous Stopped 05/28/20 0145)    And  lactated ringers bolus 500 mL (0 mLs Intravenous Stopped 05/28/20 0335)  ceFEPIme (MAXIPIME) 2 g in sodium chloride 0.9 % 100 mL IVPB (0 g Intravenous Stopped 05/28/20 0225)  iohexol (OMNIPAQUE) 300 MG/ML solution 75 mL (75 mLs Intravenous Contrast Given 05/28/20 0257)    ED Course  I have reviewed the triage vital signs and the nursing notes.  Pertinent labs & imaging results that were available during my care of the patient were reviewed by me and considered in my medical decision making (see chart for details).    MDM Rules/Calculators/A&P  64 year old male presenting to the ED with fever.  States he has felt fine over the past 2 weeks and up until today.  After dinner states he started feeling poorly all of a sudden.  He had a mechanical fall in the bathroom where he tripped over his own feet.  There was no head injury or loss of consciousness.  Wife checked his BP and noticed that it was low so called EMS.  He had temp up to 103F, given 1000 mg Tylenol.  Patient remains warm to the touch and clammy on arrival to ED.  He denies any specific symptoms--no congestion, cough, urinary symptoms, vomiting, or diarrhea.  No abdominal pain, chest pain, shortness of breath..  He has not had any sick exposures.  No new Covid exposures either.  He is fully vaccinated.  Labs sent including lactate, blood and urine cultures.  Will  obtain chest x-ray and Covid  screen.  Patient with leukocytosis of 18.5.  Lactate of 4.  BP was initially stable on arrival at 104/68, however had down trended into the 96G systolic.  Code sepsis has been activated given new criteria.  Broad-spectrum antibiotics have been given.  Chest x-ray without any noted findings.  Covid screen is negative.  UA still pending.  2:22 AM After 2.5L, BP still 83'M systolic.  Patient remains warm to the touch and clammy.  Rectal temp 67F.  He continues to deny any specific symptoms-- no chest pain, SOB, back pain, neck pain, abdominal pain, vomiting, diarrhea.  No history of IVDU.  At this point, he has had no response to fluids, BP is actually worsening since time of arrival.  Will start levophed and discuss with ICU for admission for septic shock of unclear etiology.  2:33 AM Discussed with ICU, Dr. Lucile Shutters-- will send team to evaluate patient.  He requested CT of abdomen/pelvis which has been added on.  2:58 AM Patient to CT.  BP transient got better into 62'H systolic, now back down into the 70's.  Levophed infusing. ICU team here to evaluate.    3:39 AM BP much improved with levophed, running at 81mcg/min. CT with findings of diverticulitis, however patient remains without any abdominal pain whatsoever.  He has been given broad spectrum abx-- vanc, cefepime, flagyl.  ICU will admit for ongoing care.  Final Clinical Impression(s) / ED Diagnoses Final diagnoses:  Septic shock [facility])    Rx / DC Orders ED Discharge Orders    None       Larene Pickett, PA-C 05/28/20 [place], April, MD 05/28/20 [phone]

## 2020-05-28 ENCOUNTER — Encounter (HOSPITAL_COMMUNITY): Payer: Self-pay

## 2020-05-28 ENCOUNTER — Emergency Department (HOSPITAL_COMMUNITY): Payer: 59

## 2020-05-28 DIAGNOSIS — K769 Liver disease, unspecified: Secondary | ICD-10-CM | POA: Diagnosis not present

## 2020-05-28 DIAGNOSIS — I70209 Unspecified atherosclerosis of native arteries of extremities, unspecified extremity: Secondary | ICD-10-CM | POA: Diagnosis present

## 2020-05-28 DIAGNOSIS — A419 Sepsis, unspecified organism: Secondary | ICD-10-CM | POA: Diagnosis present

## 2020-05-28 DIAGNOSIS — K529 Noninfective gastroenteritis and colitis, unspecified: Secondary | ICD-10-CM | POA: Diagnosis present

## 2020-05-28 DIAGNOSIS — Z79891 Long term (current) use of opiate analgesic: Secondary | ICD-10-CM | POA: Diagnosis not present

## 2020-05-28 DIAGNOSIS — I2692 Saddle embolus of pulmonary artery without acute cor pulmonale: Secondary | ICD-10-CM | POA: Diagnosis present

## 2020-05-28 DIAGNOSIS — K5792 Diverticulitis of intestine, part unspecified, without perforation or abscess without bleeding: Secondary | ICD-10-CM | POA: Diagnosis not present

## 2020-05-28 DIAGNOSIS — K750 Abscess of liver: Secondary | ICD-10-CM | POA: Diagnosis not present

## 2020-05-28 DIAGNOSIS — I2602 Saddle embolus of pulmonary artery with acute cor pulmonale: Secondary | ICD-10-CM | POA: Diagnosis not present

## 2020-05-28 DIAGNOSIS — Z7901 Long term (current) use of anticoagulants: Secondary | ICD-10-CM | POA: Diagnosis not present

## 2020-05-28 DIAGNOSIS — I10 Essential (primary) hypertension: Secondary | ICD-10-CM | POA: Diagnosis present

## 2020-05-28 DIAGNOSIS — R6521 Severe sepsis with septic shock: Secondary | ICD-10-CM

## 2020-05-28 DIAGNOSIS — Z79899 Other long term (current) drug therapy: Secondary | ICD-10-CM | POA: Diagnosis not present

## 2020-05-28 DIAGNOSIS — E1151 Type 2 diabetes mellitus with diabetic peripheral angiopathy without gangrene: Secondary | ICD-10-CM | POA: Diagnosis present

## 2020-05-28 DIAGNOSIS — E1169 Type 2 diabetes mellitus with other specified complication: Secondary | ICD-10-CM | POA: Diagnosis not present

## 2020-05-28 DIAGNOSIS — N179 Acute kidney failure, unspecified: Secondary | ICD-10-CM | POA: Diagnosis present

## 2020-05-28 DIAGNOSIS — Z20822 Contact with and (suspected) exposure to covid-19: Secondary | ICD-10-CM | POA: Diagnosis present

## 2020-05-28 DIAGNOSIS — K5732 Diverticulitis of large intestine without perforation or abscess without bleeding: Secondary | ICD-10-CM | POA: Diagnosis present

## 2020-05-28 DIAGNOSIS — R609 Edema, unspecified: Secondary | ICD-10-CM | POA: Diagnosis not present

## 2020-05-28 DIAGNOSIS — I82412 Acute embolism and thrombosis of left femoral vein: Secondary | ICD-10-CM | POA: Diagnosis present

## 2020-05-28 DIAGNOSIS — E1165 Type 2 diabetes mellitus with hyperglycemia: Secondary | ICD-10-CM | POA: Diagnosis present

## 2020-05-28 DIAGNOSIS — R52 Pain, unspecified: Secondary | ICD-10-CM | POA: Diagnosis not present

## 2020-05-28 DIAGNOSIS — M109 Gout, unspecified: Secondary | ICD-10-CM | POA: Diagnosis present

## 2020-05-28 DIAGNOSIS — D1803 Hemangioma of intra-abdominal structures: Secondary | ICD-10-CM | POA: Diagnosis present

## 2020-05-28 LAB — URINALYSIS, ROUTINE W REFLEX MICROSCOPIC
Bacteria, UA: NONE SEEN
Bilirubin Urine: NEGATIVE
Glucose, UA: 50 mg/dL — AB
Ketones, ur: NEGATIVE mg/dL
Leukocytes,Ua: NEGATIVE
Nitrite: NEGATIVE
Protein, ur: NEGATIVE mg/dL
Specific Gravity, Urine: 1.023 (ref 1.005–1.030)
pH: 5 (ref 5.0–8.0)

## 2020-05-28 LAB — HEPATITIS PANEL, ACUTE
HCV Ab: NONREACTIVE
Hep A IgM: NONREACTIVE
Hep B C IgM: NONREACTIVE
Hepatitis B Surface Ag: NONREACTIVE

## 2020-05-28 LAB — CBC
HCT: 38 % — ABNORMAL LOW (ref 39.0–52.0)
Hemoglobin: 12.9 g/dL — ABNORMAL LOW (ref 13.0–17.0)
MCH: 32 pg (ref 26.0–34.0)
MCHC: 33.9 g/dL (ref 30.0–36.0)
MCV: 94.3 fL (ref 80.0–100.0)
Platelets: 154 10*3/uL (ref 150–400)
RBC: 4.03 MIL/uL — ABNORMAL LOW (ref 4.22–5.81)
RDW: 12.1 % (ref 11.5–15.5)
WBC: 22.1 10*3/uL — ABNORMAL HIGH (ref 4.0–10.5)
nRBC: 0 % (ref 0.0–0.2)

## 2020-05-28 LAB — BASIC METABOLIC PANEL
Anion gap: 11 (ref 5–15)
BUN: 27 mg/dL — ABNORMAL HIGH (ref 8–23)
CO2: 22 mmol/L (ref 22–32)
Calcium: 8.4 mg/dL — ABNORMAL LOW (ref 8.9–10.3)
Chloride: 102 mmol/L (ref 98–111)
Creatinine, Ser: 1.6 mg/dL — ABNORMAL HIGH (ref 0.61–1.24)
GFR calc Af Amer: 53 mL/min — ABNORMAL LOW (ref >60)
GFR calc non Af Amer: 46 mL/min — ABNORMAL LOW (ref >60)
Glucose, Bld: 213 mg/dL — ABNORMAL HIGH (ref 70–99)
Potassium: 4.2 mmol/L (ref 3.5–5.1)
Sodium: 135 mmol/L (ref 135–145)

## 2020-05-28 LAB — GLUCOSE, CAPILLARY
Glucose-Capillary: 114 mg/dL — ABNORMAL HIGH (ref 70–99)
Glucose-Capillary: 121 mg/dL — ABNORMAL HIGH (ref 70–99)
Glucose-Capillary: 129 mg/dL — ABNORMAL HIGH (ref 70–99)
Glucose-Capillary: 207 mg/dL — ABNORMAL HIGH (ref 70–99)
Glucose-Capillary: 85 mg/dL (ref 70–99)

## 2020-05-28 LAB — HIV ANTIBODY (ROUTINE TESTING W REFLEX): HIV Screen 4th Generation wRfx: NONREACTIVE

## 2020-05-28 LAB — HEPATIC FUNCTION PANEL
ALT: 49 U/L — ABNORMAL HIGH (ref 0–44)
AST: 43 U/L — ABNORMAL HIGH (ref 15–41)
Albumin: 2.7 g/dL — ABNORMAL LOW (ref 3.5–5.0)
Alkaline Phosphatase: 88 U/L (ref 38–126)
Bilirubin, Direct: 0.3 mg/dL — ABNORMAL HIGH (ref 0.0–0.2)
Indirect Bilirubin: 0.9 mg/dL (ref 0.3–0.9)
Total Bilirubin: 1.2 mg/dL (ref 0.3–1.2)
Total Protein: 5.9 g/dL — ABNORMAL LOW (ref 6.5–8.1)

## 2020-05-28 LAB — LIPASE, BLOOD: Lipase: 26 U/L (ref 11–51)

## 2020-05-28 LAB — MRSA PCR SCREENING: MRSA by PCR: NEGATIVE

## 2020-05-28 LAB — LACTIC ACID, PLASMA
Lactic Acid, Venous: 2.6 mmol/L (ref 0.5–1.9)
Lactic Acid, Venous: 2.6 mmol/L (ref 0.5–1.9)
Lactic Acid, Venous: 1.8 mmol/L (ref 0.5–1.9)

## 2020-05-28 LAB — CK: Total CK: 152 U/L (ref 49–397)

## 2020-05-28 LAB — CBG MONITORING, ED
Glucose-Capillary: 179 mg/dL — ABNORMAL HIGH (ref 70–99)
Glucose-Capillary: 167 mg/dL — ABNORMAL HIGH (ref 70–99)

## 2020-05-28 LAB — HEMOGLOBIN A1C
Hgb A1c MFr Bld: 7 % — ABNORMAL HIGH (ref 4.8–5.6)
Mean Plasma Glucose: 154 mg/dL

## 2020-05-28 LAB — SARS CORONAVIRUS 2 BY RT PCR (HOSPITAL ORDER, PERFORMED IN ~~LOC~~ HOSPITAL LAB): SARS Coronavirus 2: NEGATIVE

## 2020-05-28 MED ORDER — DOCUSATE SODIUM 100 MG PO CAPS
100.0000 mg | ORAL_CAPSULE | Freq: Two times a day (BID) | ORAL | Status: DC | PRN
  Administered 2020-06-04: 100 mg via ORAL
  Filled 2020-05-28: qty 1

## 2020-05-28 MED ORDER — LACTATED RINGERS IV BOLUS (SEPSIS)
500.0000 mL | Freq: Once | INTRAVENOUS | Status: AC
  Administered 2020-05-28: 500 mL via INTRAVENOUS

## 2020-05-28 MED ORDER — INSULIN ASPART 100 UNIT/ML ~~LOC~~ SOLN
0.0000 [IU] | SUBCUTANEOUS | Status: DC
  Administered 2020-05-28: 3 [IU] via SUBCUTANEOUS
  Administered 2020-05-28: 1 [IU] via SUBCUTANEOUS
  Administered 2020-05-28: 3 [IU] via SUBCUTANEOUS
  Administered 2020-05-28: 5 [IU] via SUBCUTANEOUS
  Administered 2020-05-29: 2 [IU] via SUBCUTANEOUS
  Administered 2020-05-29: 3 [IU] via SUBCUTANEOUS
  Administered 2020-05-29 – 2020-05-30 (×4): 2 [IU] via SUBCUTANEOUS
  Administered 2020-05-31: 3 [IU] via SUBCUTANEOUS
  Administered 2020-05-31 – 2020-06-01 (×2): 2 [IU] via SUBCUTANEOUS
  Administered 2020-06-01: 3 [IU] via SUBCUTANEOUS
  Administered 2020-06-02: 5 [IU] via SUBCUTANEOUS
  Administered 2020-06-02: 3 [IU] via SUBCUTANEOUS
  Administered 2020-06-02: 2 [IU] via SUBCUTANEOUS
  Administered 2020-06-03: 5 [IU] via SUBCUTANEOUS
  Administered 2020-06-03: 2 [IU] via SUBCUTANEOUS

## 2020-05-28 MED ORDER — TRAMADOL HCL 50 MG PO TABS
50.0000 mg | ORAL_TABLET | Freq: Two times a day (BID) | ORAL | Status: DC | PRN
  Administered 2020-05-28: 50 mg via ORAL
  Filled 2020-05-28: qty 1

## 2020-05-28 MED ORDER — CHLORHEXIDINE GLUCONATE CLOTH 2 % EX PADS
6.0000 | MEDICATED_PAD | Freq: Every day | CUTANEOUS | Status: DC
  Administered 2020-05-28 – 2020-06-04 (×6): 6 via TOPICAL

## 2020-05-28 MED ORDER — VANCOMYCIN HCL IN DEXTROSE 1-5 GM/200ML-% IV SOLN
1000.0000 mg | Freq: Once | INTRAVENOUS | Status: AC
  Administered 2020-05-28: 1000 mg via INTRAVENOUS
  Filled 2020-05-28: qty 200

## 2020-05-28 MED ORDER — SODIUM CHLORIDE 0.9 % IV SOLN
2.0000 g | Freq: Once | INTRAVENOUS | Status: AC
  Administered 2020-05-28: 2 g via INTRAVENOUS
  Filled 2020-05-28: qty 2

## 2020-05-28 MED ORDER — POLYETHYLENE GLYCOL 3350 17 G PO PACK
17.0000 g | PACK | Freq: Every day | ORAL | Status: DC | PRN
  Administered 2020-06-04: 17 g via ORAL
  Filled 2020-05-28: qty 1

## 2020-05-28 MED ORDER — VANCOMYCIN HCL 750 MG/150ML IV SOLN
750.0000 mg | Freq: Two times a day (BID) | INTRAVENOUS | Status: DC
  Administered 2020-05-28 (×2): 750 mg via INTRAVENOUS
  Filled 2020-05-28 (×3): qty 150

## 2020-05-28 MED ORDER — SODIUM CHLORIDE 0.9 % IV SOLN
250.0000 mL | INTRAVENOUS | Status: DC
  Administered 2020-05-28: 250 mL via INTRAVENOUS

## 2020-05-28 MED ORDER — SODIUM CHLORIDE 0.9 % IV SOLN
2.0000 g | Freq: Two times a day (BID) | INTRAVENOUS | Status: DC
  Administered 2020-05-28 (×2): 2 g via INTRAVENOUS
  Filled 2020-05-28 (×2): qty 2

## 2020-05-28 MED ORDER — ALLOPURINOL 100 MG PO TABS
100.0000 mg | ORAL_TABLET | Freq: Every day | ORAL | Status: DC
  Administered 2020-05-28 – 2020-05-30 (×3): 100 mg via ORAL
  Filled 2020-05-28 (×3): qty 1

## 2020-05-28 MED ORDER — COLCHICINE 0.6 MG PO TABS
0.6000 mg | ORAL_TABLET | Freq: Every day | ORAL | Status: DC
  Administered 2020-05-28 – 2020-05-30 (×3): 0.6 mg via ORAL
  Filled 2020-05-28 (×3): qty 1

## 2020-05-28 MED ORDER — HEPARIN SODIUM (PORCINE) 5000 UNIT/ML IJ SOLN
5000.0000 [IU] | Freq: Three times a day (TID) | INTRAMUSCULAR | Status: DC
  Administered 2020-05-28 – 2020-05-29 (×3): 5000 [IU] via SUBCUTANEOUS
  Filled 2020-05-28 (×3): qty 1

## 2020-05-28 MED ORDER — LACTATED RINGERS IV BOLUS (SEPSIS)
1000.0000 mL | Freq: Once | INTRAVENOUS | Status: AC
  Administered 2020-05-28: 1000 mL via INTRAVENOUS

## 2020-05-28 MED ORDER — IOHEXOL 300 MG/ML  SOLN
75.0000 mL | Freq: Once | INTRAMUSCULAR | Status: AC | PRN
  Administered 2020-05-28: 75 mL via INTRAVENOUS

## 2020-05-28 MED ORDER — METRONIDAZOLE IN NACL 5-0.79 MG/ML-% IV SOLN
500.0000 mg | Freq: Three times a day (TID) | INTRAVENOUS | Status: DC
  Administered 2020-05-28 – 2020-05-30 (×6): 500 mg via INTRAVENOUS
  Filled 2020-05-28 (×6): qty 100

## 2020-05-28 MED ORDER — NOREPINEPHRINE 4 MG/250ML-% IV SOLN
0.0000 ug/min | INTRAVENOUS | Status: DC
  Administered 2020-05-28: 2 ug/min via INTRAVENOUS
  Filled 2020-05-28: qty 250

## 2020-05-28 MED ORDER — METRONIDAZOLE IN NACL 5-0.79 MG/ML-% IV SOLN
500.0000 mg | Freq: Once | INTRAVENOUS | Status: AC
  Administered 2020-05-28: 500 mg via INTRAVENOUS
  Filled 2020-05-28: qty 100

## 2020-05-28 NOTE — Progress Notes (Signed)
Pharmacy Antibiotic Note  Fernando Rhodes is a 64 y.o. male admitted on 05/27/2020 with sepsis.  Pharmacy has been consulted for Vancomycin/Cefepime dosing. Unclear source. WBC elevated. Noted renal dysfunction.   Plan: Vancomycin 750 mg IV q12h Cefepime 2g IV q12h Trend WBC, temp, renal function  F/U infectious work-up Drug levels as indicated   Height: 5\' 5"  (165.1 cm) Weight: 81.6 kg (180 lb) IBW/kg (Calculated) : 61.5  Temp (24hrs), Avg:98.2 F (36.8 C), Min:98 F (36.7 C), Max:98.4 F (36.9 C)  Recent Labs  Lab 05/27/20 2311 05/28/20 0235  WBC 18.4*  --   CREATININE 1.71*  --   LATICACIDVEN 4.0* 2.6*    Estimated Creatinine Clearance: 44.6 mL/min (A) (by C-G formula based on SCr of 1.71 mg/dL (H)).    Narda Bonds, PharmD, BCPS Clinical Pharmacist Phone: [phone]

## 2020-05-28 NOTE — H&P (Addendum)
NAME:  Fernando Rhodes, MRN:  [id number], DOB:  June 29, 1956, LOS: 0 ADMISSION DATE:  05/27/2020, CONSULTATION DATE:  6/18 REFERRING MD:  Dr. Randal Buba, CHIEF COMPLAINT:  Shock   Brief History   79M admitted with presumptive septic shock of unclear etiology.   History of present illness   64 year old male with PMH as below, which is significant for DM and HTN. Recently evaluated for gout at orthopedic office. Started on allopurinol and colchicine within the past couple days. He was in his usual state of health until 6/17 after dinner when he developed malaise and weakness. Fell asleep on the cough and awoke to go to the bathroom. Wife describes a syncopal episode while urinating, which prompted EMS call. Upon EMS arrival his temperature was found to be 103.6. He was given tylenol and was transferred to ED. Upon arrival to ED he was hemodynamically stable. Lab eval significant for WBC 18.5, Lactic acid 4, Glucose 273, Creatinine 1.71. He became hypotensive, which was not responsive to 2.5L IVF. He was started on levophed for BP support. PCCM was asked to admit.    Past Medical History   has a past medical history of Diabetes mellitus without complication ([place]) and Hypertension.   Significant Hospital Events   6/18 admit  Consults:    Procedures:    Significant Diagnostic Tests:  CT abdomen 6/18 > mild sigmoid colonic diverticulitis, enhancing lesion within the right liver lobe.    Micro Data:  Blood 6/18 > Urine 6/18 >  Antimicrobials:  Cefepime 6/18 > Vancomycin 6/18 > Flagyl 6/18 >  Interim history/subjective:    Objective   Blood pressure (!) 77/55, pulse 66, temperature 98 F (36.7 C), temperature source Rectal, resp. rate (!) 23, height 5\' 5"  (1.651 m), weight 81.6 kg, SpO2 95 %.        Intake/Output Summary (Last 24 hours) at 05/28/2020 0255 Last data filed at 05/28/2020 0225 Gross per 24 hour  Intake 1200 ml  Output --  Net 1200 ml   Filed Weights   05/27/20  2250 05/28/20 0005  Weight: 81.6 kg 81.6 kg    Examination: General: middle aged male in NAD HENT: [place]/AT, PERRL, no JVD Lungs: Clear, bilateral breath sounds Cardiovascular: RRR, no MRG Abdomen: Soft, non-tender,non-distended Extremities: LLE edema. NO acute deformity or ROM limitaiton. Neuro: Awake, alert. Non-focal exam. Mild speech slurring. He reports this to be his baseline.   Resolved Hospital Problem list     Assessment & Plan:  Septic shock: etiology not entirely clear. CT calling diverticulitis. ddx includes UTI.  - Send UA, culture - Empiric antibiotics as above - Ensure lactic acid clearing - Echocardiogram - Levophed for MAP > 42mmHg (s/p 2.5L IVF)  DM - CBG monitoring and SSI  AKI: prerenal azotemia secondary to shock - BP support with pressors - Trend BMP  Hypertension - Holding home lisinopril/HCTZ   LLE gout: new dx - Holding home allopurinol and colchicine while NPO  Hepatic lesion: - MRI once more stable.   Best practice:  Diet: NPO Pain/Anxiety/Delirium protocol (if indicated): NA VAP protocol (if indicated): NA DVT prophylaxis: SQH GI prophylaxis: PPI Glucose control: CBG monitoring and SSI Mobility: BR Code Status: FULL Family Communication: Wife updated via phone  Disposition: ICU  Labs   CBC: Recent Labs  Lab 05/27/20 2311  WBC 18.4*  NEUTROABS 16.7*  HGB 13.7  HCT 40.8  MCV 94.7  PLT 741    Basic Metabolic Panel: Recent Labs  Lab 05/27/20 2311  NA 134*  K 3.5  CL 101  CO2 18*  GLUCOSE 273*  BUN 28*  CREATININE 1.71*  CALCIUM 8.5*   GFR: Estimated Creatinine Clearance: 44.6 mL/min (A) (by C-G formula based on SCr of 1.71 mg/dL (H)). Recent Labs  Lab 05/27/20 2311  WBC 18.4*  LATICACIDVEN 4.0*    Liver Function Tests: Recent Labs  Lab 05/27/20 2311  AST 35  ALT 44  ALKPHOS 98  BILITOT 1.5*  PROT 6.3*  ALBUMIN 3.1*   No results for input(s): LIPASE, AMYLASE in the last 168 hours. No results for  input(s): AMMONIA in the last 168 hours.  ABG No results found for: PHART, PCO2ART, PO2ART, HCO3, TCO2, ACIDBASEDEF, O2SAT   Coagulation Profile: No results for input(s): INR, PROTIME in the last 168 hours.  Cardiac Enzymes: No results for input(s): CKTOTAL, CKMB, CKMBINDEX, TROPONINI in the last 168 hours.  HbA1C: No results found for: HGBA1C  CBG: No results for input(s): GLUCAP in the last 168 hours.  Review of Systems:   Bolds are positive  Constitutional: weight loss, gain, night sweats, Fevers, chills, fatigue .  HEENT: headaches, Sore throat, sneezing, nasal congestion, post nasal drip, Difficulty swallowing, Tooth/dental problems, visual complaints visual changes, ear ache CV:  chest pain, radiates:,Orthopnea, PND, swelling in lower extremities (Left), dizziness, palpitations, syncope.  GI  heartburn, indigestion, abdominal pain, nausea, vomiting, diarrhea, change in bowel habits, loss of appetite, bloody stools.  Resp: cough, productive:, hemoptysis, dyspnea, chest pain, pleuritic.  Skin: rash or itching or icterus GU: dysuria, change in color of urine, urgency or frequency. flank pain, hematuria  MS: joint pain or swelling. decreased range of motion  Psych: change in mood or affect. depression or anxiety.  Neuro: difficulty with speech, weakness, numbness, ataxia    Past Medical History  He,  has a past medical history of Diabetes mellitus without complication ([place]) and Hypertension.   Surgical History    Past Surgical History:  Procedure Laterality Date  . ABDOMINAL SURGERY       Social History   reports that he has never smoked. He has never used smokeless tobacco. He reports that he does not drink alcohol and does not use drugs.   Family History   His family history is not on file.   Allergies No Known Allergies   Home Medications  Prior to Admission medications   Medication Sig Start Date End Date Taking? Authorizing Provider  allopurinol (ZYLOPRIM)  100 MG tablet Take 1 tablet (100 mg total) by mouth daily. 05/25/20   Newt Minion, MD  Colchicine 0.6 MG CAPS Take 0.6 mg by mouth daily as needed. 05/25/20   Newt Minion, MD  diclofenac sodium (VOLTAREN) 1 % GEL Apply 2 g topically 4 (four) times daily. Left upper arm Patient not taking: Reported on 05/24/2020 03/16/16   Melony Overly, MD  lisinopril-hydrochlorothiazide (PRINZIDE,ZESTORETIC) 20-12.5 MG per tablet TAKE ONE TABLET BY MOUTH EVERY DAY 07/30/11   Denita Lung, MD  meloxicam (MOBIC) 15 MG tablet Take 1 tablet (15 mg total) by mouth daily. For 1 week, then as needed for pain Patient not taking: Reported on 05/24/2020 03/16/16   Melony Overly, MD  ondansetron (ZOFRAN) 4 MG tablet Take 1 tablet (4 mg total) by mouth every 6 (six) hours. Prn n/v. Patient not taking: Reported on 05/24/2020 11/27/16   Billy Fischer, MD  simvastatin (ZOCOR) 20 MG tablet Take 20 mg by mouth daily.    [provider]  traMADol (  ULTRAM) 50 MG tablet Take 1 tablet (50 mg total) by mouth every 6 (six) hours as needed. Patient not taking: Reported on 05/24/2020 03/16/16   Melony Overly, MD     Critical care time: 40 minutes, septic shock      Georgann Housekeeper, AGACNP-BC [place] for personal pager PCCM on call pager [phone]  05/28/2020 3:40 AM

## 2020-05-28 NOTE — ED Notes (Signed)
Maxwell Caul, wife, [phone] would like an update when available

## 2020-05-28 NOTE — ED Notes (Signed)
CBG 181 @ 1321, device data transfer failed

## 2020-05-28 NOTE — Progress Notes (Signed)
eLink Physician-Brief Progress Note Patient Name: Fernando Rhodes DOB: 10/14/1956 MRN: [id number]   Date of Service  05/28/2020  HPI/Events of Note  Patient is now taking PO and wants her home Colchicine and Allopurinol for gout ordered, as well as a PRN for gout pain. Her creatinine is slightly elevated so I will avoid NSAIDS for now.  eICU Interventions  Zyloprim and Colchicine reordered and PRN Ultram 50 mg Q 12 hours ordered for pain.        Kerry Kass Rashidi Loh 05/28/2020, 10:50 PM

## 2020-05-29 ENCOUNTER — Encounter (HOSPITAL_COMMUNITY): Payer: Self-pay | Admitting: Student

## 2020-05-29 ENCOUNTER — Inpatient Hospital Stay (HOSPITAL_COMMUNITY): Payer: 59

## 2020-05-29 DIAGNOSIS — R609 Edema, unspecified: Secondary | ICD-10-CM

## 2020-05-29 LAB — CBC WITH DIFFERENTIAL/PLATELET
Abs Immature Granulocytes: 0.05 10*3/uL (ref 0.00–0.07)
Basophils Absolute: 0.1 10*3/uL (ref 0.0–0.1)
Basophils Relative: 1 %
Eosinophils Absolute: 0.1 10*3/uL (ref 0.0–0.5)
Eosinophils Relative: 1 %
HCT: 35.8 % — ABNORMAL LOW (ref 39.0–52.0)
Hemoglobin: 12.4 g/dL — ABNORMAL LOW (ref 13.0–17.0)
Immature Granulocytes: 0 %
Lymphocytes Relative: 15 %
Lymphs Abs: 1.8 10*3/uL (ref 0.7–4.0)
MCH: 31.8 pg (ref 26.0–34.0)
MCHC: 34.6 g/dL (ref 30.0–36.0)
MCV: 91.8 fL (ref 80.0–100.0)
Monocytes Absolute: 0.9 10*3/uL (ref 0.1–1.0)
Monocytes Relative: 8 %
Neutro Abs: 9 10*3/uL — ABNORMAL HIGH (ref 1.7–7.7)
Neutrophils Relative %: 75 %
Platelets: 130 10*3/uL — ABNORMAL LOW (ref 150–400)
RBC: 3.9 MIL/uL — ABNORMAL LOW (ref 4.22–5.81)
RDW: 12.1 % (ref 11.5–15.5)
WBC: 11.9 10*3/uL — ABNORMAL HIGH (ref 4.0–10.5)
nRBC: 0 % (ref 0.0–0.2)

## 2020-05-29 LAB — GLUCOSE, CAPILLARY
Glucose-Capillary: 140 mg/dL — ABNORMAL HIGH (ref 70–99)
Glucose-Capillary: 126 mg/dL — ABNORMAL HIGH (ref 70–99)
Glucose-Capillary: 119 mg/dL — ABNORMAL HIGH (ref 70–99)
Glucose-Capillary: 178 mg/dL — ABNORMAL HIGH (ref 70–99)
Glucose-Capillary: 123 mg/dL — ABNORMAL HIGH (ref 70–99)

## 2020-05-29 LAB — BASIC METABOLIC PANEL
Anion gap: 9 (ref 5–15)
BUN: 23 mg/dL (ref 8–23)
CO2: 22 mmol/L (ref 22–32)
Calcium: 8 mg/dL — ABNORMAL LOW (ref 8.9–10.3)
Chloride: 104 mmol/L (ref 98–111)
Creatinine, Ser: 1.31 mg/dL — ABNORMAL HIGH (ref 0.61–1.24)
GFR calc Af Amer: >60 mL/min (ref >60)
GFR calc non Af Amer: 58 mL/min — ABNORMAL LOW (ref >60)
Glucose, Bld: 184 mg/dL — ABNORMAL HIGH (ref 70–99)
Potassium: 3.6 mmol/L (ref 3.5–5.1)
Sodium: 135 mmol/L (ref 135–145)

## 2020-05-29 LAB — URINE CULTURE: Culture: NO GROWTH

## 2020-05-29 LAB — HEPARIN LEVEL (UNFRACTIONATED): Heparin Unfractionated: 1.05 IU/mL — ABNORMAL HIGH (ref 0.30–0.70)

## 2020-05-29 MED ORDER — IOHEXOL 350 MG/ML SOLN
75.0000 mL | Freq: Once | INTRAVENOUS | Status: AC | PRN
  Administered 2020-05-29: 60 mL via INTRAVENOUS

## 2020-05-29 MED ORDER — LACTATED RINGERS IV SOLN: INTRAVENOUS | Status: DC

## 2020-05-29 MED ORDER — HEPARIN BOLUS VIA INFUSION
4000.0000 [IU] | Freq: Once | INTRAVENOUS | Status: AC
  Administered 2020-05-29: 4000 [IU] via INTRAVENOUS
  Filled 2020-05-29: qty 4000

## 2020-05-29 MED ORDER — HEPARIN (PORCINE) 25000 UT/250ML-% IV SOLN
1250.0000 [IU]/h | INTRAVENOUS | Status: DC
  Administered 2020-05-29: 1250 [IU]/h via INTRAVENOUS
  Filled 2020-05-29: qty 250

## 2020-05-29 MED ORDER — HEPARIN (PORCINE) 25000 UT/250ML-% IV SOLN
1350.0000 [IU]/h | INTRAVENOUS | Status: DC
  Administered 2020-05-29 – 2020-05-30 (×2): 1050 [IU]/h via INTRAVENOUS
  Administered 2020-05-31: 1350 [IU]/h via INTRAVENOUS
  Filled 2020-05-29 (×4): qty 250

## 2020-05-29 MED ORDER — SODIUM CHLORIDE 0.9 % IV SOLN
2.0000 g | INTRAVENOUS | Status: DC
  Administered 2020-05-29 – 2020-05-30 (×2): 2 g via INTRAVENOUS
  Filled 2020-05-29 (×2): qty 20

## 2020-05-29 NOTE — Progress Notes (Addendum)
ANTICOAGULATION CONSULT NOTE - Initial Consult  Pharmacy Consult for heparin Indication: pulmonary embolus  No Known Allergies  Patient Measurements: Height: 5\' 9"  (175.3 cm) Weight: 73.6 kg (162 lb 4.1 oz) IBW/kg (Calculated) : 70.7 Heparin Dosing Weight: 73.6  Vital Signs: Temp: 98.7 F (37.1 C) (06/19 0733) Temp Source: Oral (06/19 0733) BP: 110/73 (06/19 1000) Pulse Rate: 72 (06/19 1100)  Labs: Recent Labs    05/27/20 2311 05/27/20 2311 05/28/20 0356 05/29/20 0848  HGB 13.7   < > 12.9* 12.4*  HCT 40.8  --  38.0* 35.8*  PLT 156  --  154 130*  CREATININE 1.71*  --  1.60* 1.31*  CKTOTAL 152  --   --   --    < > = values in this interval not displayed.    Estimated Creatinine Clearance: 59.2 mL/min (A) (by C-G formula based on SCr of 1.31 mg/dL (H)).   Medical History: Past Medical History:  Diagnosis Date  . Diabetes mellitus without complication ([place])   . Hypertension     Assessment: Patient originally presenting with shock thought to be potentially septic is now with newly found saddle PE on CTA. Pharmacy has been asked to initiate heparin drip  Goal of Therapy:  Heparin level 0.3-0.7 units/ml Monitor platelets by anticoagulation protocol: Yes   Plan:  Give 4000 units bolus x 1  Start heparin 1250 units/hr 6 hour HL Daily HL and CBC  Marliss Czar Stefon Ramthun 05/29/2020,11:17 AM

## 2020-05-29 NOTE — Progress Notes (Signed)
ANTICOAGULATION CONSULT NOTE  Pharmacy Consult for heparin Indication: pulmonary embolus  No Known Allergies  Patient Measurements: Height: 5\' 9"  (175.3 cm) Weight: 73.6 kg (162 lb 4.1 oz) IBW/kg (Calculated) : 70.7 Heparin Dosing Weight: 73.6  Vital Signs: Temp: 97.9 F (36.6 C) (06/19 1525) Temp Source: Oral (06/19 1525) BP: 123/77 (06/19 1900) Pulse Rate: 84 (06/19 1900)  Labs: Recent Labs    05/27/20 2311 05/27/20 2311 05/28/20 0356 05/29/20 0848 05/29/20 1747  HGB 13.7   < > 12.9* 12.4*  --   HCT 40.8  --  38.0* 35.8*  --   PLT 156  --  154 130*  --   HEPARINUNFRC  --   --   --   --  1.05*  CREATININE 1.71*  --  1.60* 1.31*  --   CKTOTAL 152  --   --   --   --    < > = values in this interval not displayed.    Estimated Creatinine Clearance: 59.2 mL/min (A) (by C-G formula based on SCr of 1.31 mg/dL (H)).   Medical History: Past Medical History:  Diagnosis Date  . Diabetes mellitus without complication ([place])   . Hypertension     Assessment: Patient originally presenting with shock thought to be potentially septic is now with newly found saddle PE on CTA. Pharmacy has been asked to initiate heparin drip.  Initial heparin level elevated at 1.05, no SSx bleeding documented.  Goal of Therapy:  Heparin level 0.3-0.7 units/ml Monitor platelets by anticoagulation protocol: Yes   Plan:  -Hold heparin x1h then restart at 1050 units/h -Recheck heparin level 6hr after restart   Arrie Senate, PharmD, BCPS Clinical Pharmacist [phone] Please check AMION for all [place] numbers 05/29/2020

## 2020-05-29 NOTE — Progress Notes (Signed)
VASCULAR LAB PRELIMINARY  PRELIMINARY  PRELIMINARY  PRELIMINARY  Left lower extremity venous duplex completed.    Preliminary report:  See CV proc for preliminary results.  Oren Beckmann, RN, results.  Manvi Guilliams, RVT 05/29/2020, 9:38 AM

## 2020-05-29 NOTE — H&P (Signed)
NAME:  Fernando Rhodes, MRN:  [id number], DOB:  [birth year], LOS: 1 ADMISSION DATE:  05/27/2020, CONSULTATION DATE:  6/18 REFERRING MD:  Dr. Randal Buba, CHIEF COMPLAINT:  Shock   Brief History   43M admitted with presumptive septic shock of unclear etiology.   History of present illness   64 year old male with PMH as below, which is significant for DM and HTN. Recently evaluated for gout at orthopedic office. Started on allopurinol and colchicine within the past couple days. He was in his usual state of health until 6/17 after dinner when he developed malaise and weakness. Fell asleep on the cough and awoke to go to the bathroom. Wife describes a syncopal episode while urinating, which prompted EMS call. Upon EMS arrival his temperature was found to be 103.6. He was given tylenol and was transferred to ED. Upon arrival to ED he was hemodynamically stable. Lab eval significant for WBC 18.5, Lactic acid 4, Glucose 273, Creatinine 1.71. He became hypotensive, which was not responsive to 2.5L IVF. He was started on levophed for BP support. PCCM was asked to admit.    Past Medical History   has a past medical history of Diabetes mellitus without complication ([place]) and Hypertension.   Significant Hospital Events   6/18 admit  Consults:  none  Procedures:   none  Significant Diagnostic Tests:  CT abdomen 6/18 > mild sigmoid colonic diverticulitis, enhancing lesion within the right liver lobe.    Micro Data:  Blood 6/18 > Urine 6/18 >  Antimicrobials:  Cefepime 6/18 > Vancomycin 6/18 > Flagyl 6/18 >  Interim history/subjective:  Continues to complain of left foot pain.  Denies abdominal pain.  Objective   Blood pressure 98/70, pulse 78, temperature 98.7 F (37.1 C), temperature source Oral, resp. rate (!) 25, height 5\' 9"  (1.753 m), weight 73.6 kg, SpO2 95 %.        Intake/Output Summary (Last 24 hours) at 05/29/2020 0809 Last data filed at 05/29/2020 0600 Gross per 24 hour    Intake 1106.83 ml  Output 1485 ml  Net -378.17 ml   Filed Weights   05/28/20 0005 05/28/20 1423 05/29/20 0500  Weight: 81.6 kg 78.7 kg 73.6 kg    Examination: General: middle aged male in NAD HENT: [place]/AT, PERRL, no JVD Lungs: Clear, bilateral breath sounds Cardiovascular: RRR, no MRG, no JVD. Abdomen: Soft, non-tender,non-distended Extremities: LLE edema. NO acute deformity or ROM limitaiton. Neuro: Awake, alert. Non-focal exam. Mild speech slurring. He reports this to be his baseline.   Resolved Hospital Problem list     Assessment & Plan:  Was critically ill due to septic shock, now resolved: etiology not entirely clear. CT calling diverticulitis. - Send UA, culture -Continue empiric antibiotics pending finalized blood cultures -If cultures negative would complete 7 days of oral antibiotics covering for diverticulitis.  Unexplained left leg pain.  Recently saw orthopedics not felt to be due to to gout. -We will rule out venous thromboembolic disease as a potential cause of pain as well as hypotension.  DM - CBG monitoring and SSI  AKI: prerenal azotemia secondary to shock - Trend BMP  Hypertension - Holding home lisinopril/HCTZ   LLE gout: new dx -Have resumed home allopurinol and colchicine.  Hepatic lesion: - MRI once more stable.   Best practice:  Diet: Clear liquid diet. Pain/Anxiety/Delirium protocol (if indicated): NA VAP protocol (if indicated): NA DVT prophylaxis: SQH GI prophylaxis: PPI Glucose control: CBG monitoring and SSI Mobility: BR Code Status: FULL Family  Communication: Wife updated via phone  Disposition: Ready for transfer to floor once once VTE work-up complete.  Labs   CBC: Recent Labs  Lab 05/27/20 2311 05/28/20 0356  WBC 18.4* 22.1*  NEUTROABS 16.7*  --   HGB 13.7 12.9*  HCT 40.8 38.0*  MCV 94.7 94.3  PLT 156 201    Basic Metabolic Panel: Recent Labs  Lab 05/27/20 2311 05/28/20 0356  NA 134* 135  K 3.5 4.2  CL 101  102  CO2 18* 22  GLUCOSE 273* 213*  BUN 28* 27*  CREATININE 1.71* 1.60*  CALCIUM 8.5* 8.4*   GFR: Estimated Creatinine Clearance: 48.5 mL/min (A) (by C-G formula based on SCr of 1.6 mg/dL (H)). Recent Labs  Lab 05/27/20 2311 05/28/20 0235 05/28/20 0356 05/28/20 0820  WBC 18.4*  --  22.1*  --   LATICACIDVEN 4.0* 2.6* 2.6* 1.8    Liver Function Tests: Recent Labs  Lab 05/27/20 2311 05/28/20 0356  AST 35 43*  ALT 44 49*  ALKPHOS 98 88  BILITOT 1.5* 1.2  PROT 6.3* 5.9*  ALBUMIN 3.1* 2.7*   Recent Labs  Lab 05/27/20 2311  LIPASE 26   No results for input(s): AMMONIA in the last 168 hours.  ABG No results found for: PHART, PCO2ART, PO2ART, HCO3, TCO2, ACIDBASEDEF, O2SAT   Coagulation Profile: No results for input(s): INR, PROTIME in the last 168 hours.  Cardiac Enzymes: Recent Labs  Lab 05/27/20 2311  CKTOTAL 152    HbA1C: Hgb A1c MFr Bld  Date/Time Value Ref Range Status  05/28/2020 03:56 AM 7.0 (H) 4.8 - 5.6 % Final    Comment:    (NOTE)         Prediabetes: 5.7 - 6.4         Diabetes: >6.4         Glycemic control for adults with diabetes: <7.0     CBG: Recent Labs  Lab 05/28/20 1538 05/28/20 1920 05/28/20 2330 05/29/20 0342 05/29/20 0731  GLUCAP 114* 207* 85 140* [place], MD [facility] ICU Physician [place]  Pager: [phone] Mobile: [phone] After hours: [phone].  05/29/2020, 8:18 AM

## 2020-05-29 NOTE — Progress Notes (Signed)
Critical care attending progress note  CT PE confirms a large bilateral pulmonary embolism with RV strain. This would explain his initial hypotension and left leg pain.  Ultrasound demonstrates left venous thrombosis with evidence of lymphadenopathy.  Patient is presently hemodynamically stable saturating 96% on room air with systolic blood pressure 702 heart rate in the 80s.  He does not meet criteria for thrombolysis at this time.  He is he has been started on IV heparin.  Should he decline he has no contraindications to thrombolysis.  We will order CT abdomen and pelvis to elucidate cause of lymphadenopathy.  [name], MD [facility] ICU Physician [place]  Pager: [phone] Mobile: [phone] After hours: [phone].  05/29/2020, 6:26 PM

## 2020-05-30 ENCOUNTER — Inpatient Hospital Stay (HOSPITAL_COMMUNITY): Payer: 59

## 2020-05-30 LAB — CBC WITH DIFFERENTIAL/PLATELET
Abs Immature Granulocytes: 0.04 10*3/uL (ref 0.00–0.07)
Basophils Absolute: 0.1 10*3/uL (ref 0.0–0.1)
Basophils Relative: 1 %
Eosinophils Absolute: 0.1 10*3/uL (ref 0.0–0.5)
Eosinophils Relative: 1 %
HCT: 35.4 % — ABNORMAL LOW (ref 39.0–52.0)
Hemoglobin: 12.4 g/dL — ABNORMAL LOW (ref 13.0–17.0)
Immature Granulocytes: 0 %
Lymphocytes Relative: 22 %
Lymphs Abs: 2.2 10*3/uL (ref 0.7–4.0)
MCH: 32 pg (ref 26.0–34.0)
MCHC: 35 g/dL (ref 30.0–36.0)
MCV: 91.2 fL (ref 80.0–100.0)
Monocytes Absolute: 0.7 10*3/uL (ref 0.1–1.0)
Monocytes Relative: 7 %
Neutro Abs: 6.7 10*3/uL (ref 1.7–7.7)
Neutrophils Relative %: 69 %
Platelets: 153 10*3/uL (ref 150–400)
RBC: 3.88 MIL/uL — ABNORMAL LOW (ref 4.22–5.81)
RDW: 12 % (ref 11.5–15.5)
WBC: 9.7 10*3/uL (ref 4.0–10.5)
nRBC: 0 % (ref 0.0–0.2)

## 2020-05-30 LAB — GLUCOSE, CAPILLARY
Glucose-Capillary: 117 mg/dL — ABNORMAL HIGH (ref 70–99)
Glucose-Capillary: 117 mg/dL — ABNORMAL HIGH (ref 70–99)
Glucose-Capillary: 118 mg/dL — ABNORMAL HIGH (ref 70–99)
Glucose-Capillary: 121 mg/dL — ABNORMAL HIGH (ref 70–99)
Glucose-Capillary: 128 mg/dL — ABNORMAL HIGH (ref 70–99)
Glucose-Capillary: 143 mg/dL — ABNORMAL HIGH (ref 70–99)

## 2020-05-30 LAB — HEPARIN LEVEL (UNFRACTIONATED)
Heparin Unfractionated: 0.41 IU/mL (ref 0.30–0.70)
Heparin Unfractionated: 0.3 IU/mL (ref 0.30–0.70)
Heparin Unfractionated: 0.23 IU/mL — ABNORMAL LOW (ref 0.30–0.70)

## 2020-05-30 MED ORDER — GADOBUTROL 1 MMOL/ML IV SOLN
7.0000 mL | Freq: Once | INTRAVENOUS | Status: AC | PRN
  Administered 2020-05-30: 7 mL via INTRAVENOUS

## 2020-05-30 MED ORDER — HEPARIN BOLUS VIA INFUSION
1500.0000 [IU] | Freq: Once | INTRAVENOUS | Status: AC
  Administered 2020-05-30: 1500 [IU] via INTRAVENOUS
  Filled 2020-05-30: qty 1500

## 2020-05-30 NOTE — Progress Notes (Signed)
[place] for heparin Indication: pulmonary embolus  No Known Allergies  Patient Measurements: Height: 5\' 9"  (175.3 cm) Weight: 73.6 kg (162 lb 4.1 oz) IBW/kg (Calculated) : 70.7 Heparin Dosing Weight: 73.6  Vital Signs: Temp: 99.3 F (37.4 C) (06/19 2300) Temp Source: Oral (06/19 2300) BP: 114/80 (06/20 0300) Pulse Rate: 75 (06/20 0300)  Labs: Recent Labs    05/27/20 2311 05/27/20 2311 05/28/20 0356 05/28/20 0356 05/29/20 0848 05/29/20 1747 05/30/20 0213  HGB 13.7   < > 12.9*   < > 12.4*  --  12.4*  HCT 40.8   < > 38.0*  --  35.8*  --  35.4*  PLT 156   < > 154  --  130*  --  153  HEPARINUNFRC  --   --   --   --   --  1.05* 0.41  CREATININE 1.71*  --  1.60*  --  1.31*  --   --   CKTOTAL 152  --   --   --   --   --   --    < > = values in this interval not displayed.    Estimated Creatinine Clearance: 59.2 mL/min (A) (by C-G formula based on SCr of 1.31 mg/dL (H)).   Assessment: 64 y.o. male with PE for heparin  Goal of Therapy:  Heparin level 0.3-0.7 units/ml Monitor platelets by anticoagulation protocol: Yes   Plan:  Continue Heparin at current rate  Recheck level in 6 hours to verify  Phillis Knack, PharmD, BCPS  05/30/2020

## 2020-05-30 NOTE — Progress Notes (Signed)
ANTICOAGULATION CONSULT NOTE  Pharmacy Consult for heparin Indication: pulmonary embolus  No Known Allergies  Patient Measurements: Height: 5\' 9"  (175.3 cm) Weight: 74.6 kg (164 lb 7.4 oz) IBW/kg (Calculated) : 70.7 Heparin Dosing Weight: 73.6  Vital Signs: Temp: 98.1 F (36.7 C) (06/20 1100) Temp Source: Oral (06/20 1100) BP: 120/79 (06/20 0500) Pulse Rate: 72 (06/20 1000)  Labs: Recent Labs    05/27/20 2311 05/27/20 2311 05/28/20 0356 05/28/20 0356 05/29/20 0848 05/29/20 1747 05/30/20 0213 05/30/20 1050  HGB 13.7   < > 12.9*   < > 12.4*  --  12.4*  --   HCT 40.8   < > 38.0*  --  35.8*  --  35.4*  --   PLT 156   < > 154  --  130*  --  153  --   HEPARINUNFRC  --   --   --   --   --  1.05* 0.41 0.30  CREATININE 1.71*  --  1.60*  --  1.31*  --   --   --   CKTOTAL 152  --   --   --   --   --   --   --    < > = values in this interval not displayed.    Estimated Creatinine Clearance: 59.2 mL/min (A) (by C-G formula based on SCr of 1.31 mg/dL (H)).   Medical History: Past Medical History:  Diagnosis Date  . Diabetes mellitus without complication ([place])   . Hypertension     Assessment: Patient originally presenting with shock thought to be potentially septic is now with newly found saddle PE on CTA. Pharmacy has been asked to initiate heparin drip.  Heparin level on lower end of therapeutic at 0.3, given the indication I will bump up the rate slightly from 1050 to 1150 and recheck a level in 6 hours   Goal of Therapy:  Heparin level 0.3-0.7 units/ml Monitor platelets by anticoagulation protocol: Yes   Plan:  -Increase heparin rate to 1150 units/hr -Recheck heparin level 6hr after change -Daily HL, CBC   Nicoletta Dress, PharmD PGY2 Infectious Disease Pharmacy Resident  Please check AMION for all [facility] Pharmacy numbers 05/30/2020

## 2020-05-30 NOTE — Progress Notes (Addendum)
ANTICOAGULATION CONSULT NOTE  Pharmacy Consult for heparin Indication: pulmonary embolus  No Known Allergies  Patient Measurements: Height: 5\' 9"  (175.3 cm) Weight: 74.6 kg (164 lb 7.4 oz) IBW/kg (Calculated) : 70.7 Heparin Dosing Weight: 73.6  Vital Signs: Temp: 97.9 F (36.6 C) (06/20 1600) Temp Source: Oral (06/20 1600) BP: 122/75 (06/20 1400) Pulse Rate: 73 (06/20 1800)  Labs: Recent Labs    05/27/20 2311 05/27/20 2311 05/28/20 0356 05/28/20 0356 05/29/20 0848 05/29/20 1747 05/30/20 0213 05/30/20 1050 05/30/20 1827  HGB 13.7   < > 12.9*   < > 12.4*  --  12.4*  --   --   HCT 40.8   < > 38.0*  --  35.8*  --  35.4*  --   --   PLT 156   < > 154  --  130*  --  153  --   --   HEPARINUNFRC  --   --   --   --   --    < > 0.41 0.30 0.23*  CREATININE 1.71*  --  1.60*  --  1.31*  --   --   --   --   CKTOTAL 152  --   --   --   --   --   --   --   --    < > = values in this interval not displayed.    Estimated Creatinine Clearance: 59.2 mL/min (A) (by C-G formula based on SCr of 1.31 mg/dL (H)).   Medical History: Past Medical History:  Diagnosis Date  . Diabetes mellitus without complication ([place])   . Hypertension     Assessment: Patient originally presenting with shock thought to be potentially septic is now with newly found saddle PE on CTA. Pharmacy has been asked to initiate heparin drip.  Heparin level now subtherapeutic at 0.23, per RN no infusion issues today.  Goal of Therapy:  Heparin level 0.3-0.7 units/ml Monitor platelets by anticoagulation protocol: Yes   Plan:  -Heparin 1500 units x1 then increase infusion to 1350 units/h -Recheck heparin level in 6h   Arrie Senate, PharmD, BCPS Clinical Pharmacist [phone] Please check AMION for all [facility] numbers 05/30/2020

## 2020-05-30 NOTE — H&P (Signed)
NAME:  Fernando Rhodes, MRN:  [id number], DOB:  10-09-1956, LOS: 2 ADMISSION DATE:  05/27/2020, CONSULTATION DATE:  6/18 REFERRING MD:  Dr. Randal Buba, CHIEF COMPLAINT:  Shock   Brief History   76M admitted with presumptive septic shock of unclear etiology.   History of present illness   64 year old male with PMH as below, which is significant for DM and HTN. Recently evaluated for gout at orthopedic office. Started on allopurinol and colchicine within the past couple days. He was in his usual state of health until 6/17 after dinner when he developed malaise and weakness. Fell asleep on the cough and awoke to go to the bathroom. Wife describes a syncopal episode while urinating, which prompted EMS call. Upon EMS arrival his temperature was found to be 103.6. He was given tylenol and was transferred to ED. Upon arrival to ED he was hemodynamically stable. Lab eval significant for WBC 18.5, Lactic acid 4, Glucose 273, Creatinine 1.71. He became hypotensive, which was not responsive to 2.5L IVF. He was started on levophed for BP support. PCCM was asked to admit.    Past Medical History   has a past medical history of Diabetes mellitus without complication ([place]) and Hypertension.   Significant Hospital Events   6/18 admit  Consults:  none  Procedures:   none  Significant Diagnostic Tests:  CT abdomen 6/18 > mild sigmoid colonic diverticulitis, enhancing lesion within the right liver lobe.    Micro Data:  Blood 6/18 > Urine 6/18 >  Antimicrobials:  Cefepime 6/18 > Vancomycin 6/18 > Flagyl 6/18 >  Interim history/subjective:  Denies shortness of breath.  Eager to get out of bed.  Objective   Blood pressure 122/75, pulse 74, temperature 98.1 F (36.7 C), temperature source Oral, resp. rate 16, height 5\' 9"  (1.753 m), weight 74.6 kg, SpO2 98 %.        Intake/Output Summary (Last 24 hours) at 05/30/2020 1508 Last data filed at 05/30/2020 1400 Gross per 24 hour  Intake 1651.72  ml  Output 600 ml  Net 1051.72 ml   Filed Weights   05/28/20 1423 05/29/20 0500 05/30/20 0500  Weight: 78.7 kg 73.6 kg 74.6 kg    Examination: General: middle aged male in NAD HENT: [place]/AT, PERRL, no JVD Lungs: Clear, bilateral breath sounds Cardiovascular: RRR, no MRG, no JVD. Abdomen: Soft, non-tender,non-distended Extremities: LLE edema. NO acute deformity or ROM limitaiton. Neuro: Awake, alert. Non-focal exam. Mild speech slurring. He reports this to be his baseline.   Resolved Hospital Problem list     Assessment & Plan:   Submassive pulmonary embolism secondary to left leg DVT causing hypotension.  Currently hemodynamically stable. -Does not meet criteria for intervention or thrombolysis. -Obtain echocardiogram today. -Can transfer out of echocardiogram favorable and transition to oral therapy. -Likely discharge early this week.   Unexplained left leg pain.  Recently saw orthopedics not felt to be due to to gout. -Suspect new leg pain due to VTE not gout.  Will stop gout medication..  DM - CBG monitoring and SSI  AKI: prerenal azotemia secondary to shock - Trend BMP  Hypertension - Holding home lisinopril/HCTZ   LLE gout: new dx -Have resumed home allopurinol and colchicine.  Hepatic lesion: - MRI once more stable.   Best practice:  Diet: Clear liquid diet. Pain/Anxiety/Delirium protocol (if indicated): NA VAP protocol (if indicated): NA DVT prophylaxis: SQH GI prophylaxis: PPI Glucose control: CBG monitoring and SSI Mobility: BR Code Status: FULL Family Communication: Wife  updated via phone  Disposition: Ready for transfer to floor once once VTE work-up complete.  Labs   CBC: Recent Labs  Lab 05/27/20 2311 05/28/20 0356 05/29/20 0848 05/30/20 0213  WBC 18.4* 22.1* 11.9* 9.7  NEUTROABS 16.7*  --  9.0* 6.7  HGB 13.7 12.9* 12.4* 12.4*  HCT 40.8 38.0* 35.8* 35.4*  MCV 94.7 94.3 91.8 91.2  PLT 156 154 130* 960    Basic Metabolic  Panel: Recent Labs  Lab 05/27/20 2311 05/28/20 0356 05/29/20 0848  NA 134* 135 135  K 3.5 4.2 3.6  CL 101 102 104  CO2 18* 22 22  GLUCOSE 273* 213* 184*  BUN 28* 27* 23  CREATININE 1.71* 1.60* 1.31*  CALCIUM 8.5* 8.4* 8.0*   GFR: Estimated Creatinine Clearance: 59.2 mL/min (A) (by C-G formula based on SCr of 1.31 mg/dL (H)). Recent Labs  Lab 05/27/20 2311 05/28/20 0235 05/28/20 0356 05/28/20 0820 05/29/20 0848 05/30/20 0213  WBC 18.4*  --  22.1*  --  11.9* 9.7  LATICACIDVEN 4.0* 2.6* 2.6* 1.8  --   --     Liver Function Tests: Recent Labs  Lab 05/27/20 2311 05/28/20 0356  AST 35 43*  ALT 44 49*  ALKPHOS 98 88  BILITOT 1.5* 1.2  PROT 6.3* 5.9*  ALBUMIN 3.1* 2.7*   Recent Labs  Lab 05/27/20 2311  LIPASE 26   No results for input(s): AMMONIA in the last 168 hours.  ABG No results found for: PHART, PCO2ART, PO2ART, HCO3, TCO2, ACIDBASEDEF, O2SAT   Coagulation Profile: No results for input(s): INR, PROTIME in the last 168 hours.  Cardiac Enzymes: Recent Labs  Lab 05/27/20 2311  CKTOTAL 152    HbA1C: Hgb A1c MFr Bld  Date/Time Value Ref Range Status  05/28/2020 03:56 AM 7.0 (H) 4.8 - 5.6 % Final    Comment:    (NOTE)         Prediabetes: 5.7 - 6.4         Diabetes: >6.4         Glycemic control for adults with diabetes: <7.0     CBG: Recent Labs  Lab 05/29/20 2000 05/30/20 0000 05/30/20 0354 05/30/20 0730 05/30/20 1131  GLUCAP 123* 118* 117* [place], MD [facility] ICU Physician [place]  Pager: [phone] Mobile: [phone] After hours: [phone].  05/30/2020, 3:08 PM

## 2020-05-31 ENCOUNTER — Telehealth: Payer: Self-pay | Admitting: Critical Care Medicine

## 2020-05-31 ENCOUNTER — Inpatient Hospital Stay (HOSPITAL_COMMUNITY): Payer: 59

## 2020-05-31 DIAGNOSIS — Z7901 Long term (current) use of anticoagulants: Secondary | ICD-10-CM

## 2020-05-31 DIAGNOSIS — I2602 Saddle embolus of pulmonary artery with acute cor pulmonale: Secondary | ICD-10-CM

## 2020-05-31 DIAGNOSIS — K769 Liver disease, unspecified: Secondary | ICD-10-CM

## 2020-05-31 LAB — CBC WITH DIFFERENTIAL/PLATELET
Abs Immature Granulocytes: 0.06 10*3/uL (ref 0.00–0.07)
Basophils Absolute: 0.1 10*3/uL (ref 0.0–0.1)
Basophils Relative: 1 %
Eosinophils Absolute: 0.2 10*3/uL (ref 0.0–0.5)
Eosinophils Relative: 2 %
HCT: 35.4 % — ABNORMAL LOW (ref 39.0–52.0)
Hemoglobin: 12.4 g/dL — ABNORMAL LOW (ref 13.0–17.0)
Immature Granulocytes: 1 %
Lymphocytes Relative: 27 %
Lymphs Abs: 2.3 10*3/uL (ref 0.7–4.0)
MCH: 32 pg (ref 26.0–34.0)
MCHC: 35 g/dL (ref 30.0–36.0)
MCV: 91.5 fL (ref 80.0–100.0)
Monocytes Absolute: 0.6 10*3/uL (ref 0.1–1.0)
Monocytes Relative: 7 %
Neutro Abs: 5.5 10*3/uL (ref 1.7–7.7)
Neutrophils Relative %: 62 %
Platelets: 178 10*3/uL (ref 150–400)
RBC: 3.87 MIL/uL — ABNORMAL LOW (ref 4.22–5.81)
RDW: 12 % (ref 11.5–15.5)
WBC: 8.7 10*3/uL (ref 4.0–10.5)
nRBC: 0 % (ref 0.0–0.2)

## 2020-05-31 LAB — GLUCOSE, CAPILLARY
Glucose-Capillary: 102 mg/dL — ABNORMAL HIGH (ref 70–99)
Glucose-Capillary: 106 mg/dL — ABNORMAL HIGH (ref 70–99)
Glucose-Capillary: 107 mg/dL — ABNORMAL HIGH (ref 70–99)
Glucose-Capillary: 120 mg/dL — ABNORMAL HIGH (ref 70–99)
Glucose-Capillary: 124 mg/dL — ABNORMAL HIGH (ref 70–99)
Glucose-Capillary: 127 mg/dL — ABNORMAL HIGH (ref 70–99)
Glucose-Capillary: 160 mg/dL — ABNORMAL HIGH (ref 70–99)

## 2020-05-31 LAB — ECHOCARDIOGRAM COMPLETE
Height: 69 in
Weight: 2645.52 oz

## 2020-05-31 LAB — BASIC METABOLIC PANEL
Anion gap: 11 (ref 5–15)
BUN: 14 mg/dL (ref 8–23)
CO2: 22 mmol/L (ref 22–32)
Calcium: 8 mg/dL — ABNORMAL LOW (ref 8.9–10.3)
Chloride: 103 mmol/L (ref 98–111)
Creatinine, Ser: 1.13 mg/dL (ref 0.61–1.24)
GFR calc Af Amer: >60 mL/min (ref >60)
GFR calc non Af Amer: >60 mL/min (ref >60)
Glucose, Bld: 125 mg/dL — ABNORMAL HIGH (ref 70–99)
Potassium: 3.6 mmol/L (ref 3.5–5.1)
Sodium: 136 mmol/L (ref 135–145)

## 2020-05-31 LAB — HEPARIN LEVEL (UNFRACTIONATED)
Heparin Unfractionated: 0.4 IU/mL (ref 0.30–0.70)
Heparin Unfractionated: 0.42 IU/mL (ref 0.30–0.70)
Heparin Unfractionated: 0.41 IU/mL (ref 0.30–0.70)

## 2020-05-31 MED ORDER — POTASSIUM CHLORIDE CRYS ER 20 MEQ PO TBCR
40.0000 meq | EXTENDED_RELEASE_TABLET | Freq: Once | ORAL | Status: AC
  Administered 2020-05-31: 40 meq via ORAL
  Filled 2020-05-31: qty 2

## 2020-05-31 NOTE — Progress Notes (Signed)
Echocardiogram 2D Echocardiogram has been performed.  Oneal Deputy Guyla Bless 05/31/2020, 1:11 PM

## 2020-05-31 NOTE — Progress Notes (Signed)
ANTICOAGULATION CONSULT NOTE  Pharmacy Consult for heparin Indication: pulmonary embolus  No Known Allergies  Patient Measurements: Height: 5\' 9"  (175.3 cm) Weight: 75 kg (165 lb 5.5 oz) IBW/kg (Calculated) : 70.7 Heparin Dosing Weight: 73.6  Vital Signs: Temp: 97.6 F (36.4 C) (06/21 1518) Temp Source: Oral (06/21 1518) BP: 113/86 (06/21 1300) Pulse Rate: 80 (06/21 1300)  Labs: Recent Labs    05/29/20 0848 05/29/20 1747 05/30/20 0213 05/30/20 1050 05/31/20 0313 05/31/20 0856 05/31/20 1629  HGB 12.4*  --  12.4*  --  12.4*  --   --   HCT 35.8*  --  35.4*  --  35.4*  --   --   PLT 130*  --  153  --  178  --   --   HEPARINUNFRC  --    < > 0.41   < > 0.40 0.42 0.41  CREATININE 1.31*  --   --   --  1.13  --   --    < > = values in this interval not displayed.    Estimated Creatinine Clearance: 68.6 mL/min (by C-G formula based on SCr of 1.13 mg/dL).   Medical History: Past Medical History:  Diagnosis Date  . Diabetes mellitus without complication ([place])   . Hypertension     Assessment: Patient originally presenting with shock thought to be potentially septic is now with newly found saddle PE on CTA. Pharmacy has been asked to initiate heparin drip.  Heparin level remains therapeutic, per RN no infusion issues today.  Goal of Therapy:  Heparin level 0.3-0.7 units/ml Monitor platelets by anticoagulation protocol: Yes   Plan:  -Continue Heparin infusion at 1350 units/h - Heparin level and CBC qam     [place] Crane, Vena [place], BCPS, BCCP Clinical Pharmacist  05/31/2020 5:18 PM   [facility] pharmacy phone numbers are listed on [email or web address]

## 2020-05-31 NOTE — Progress Notes (Signed)
NAME:  Fernando Rhodes, MRN:  [id number], DOB:  1956-06-17, LOS: 3 ADMISSION DATE:  05/27/2020, CONSULTATION DATE:  6/18 REFERRING MD:  Dr. Randal Buba, CHIEF COMPLAINT:  Shock   Brief History   67M admitted with presumptive septic shock of unclear etiology.   History of present illness   64 year old male with PMH as below, which is significant for DM and HTN. Recently evaluated for gout at orthopedic office. Started on allopurinol and colchicine within the past couple days. He was in his usual state of health until 6/17 after dinner when he developed malaise and weakness. Fell asleep on the cough and awoke to go to the bathroom. Wife describes a syncopal episode while urinating, which prompted EMS call. Upon EMS arrival his temperature was found to be 103.6. He was given tylenol and was transferred to ED. Upon arrival to ED he was hemodynamically stable. Lab eval significant for WBC 18.5, Lactic acid 4, Glucose 273, Creatinine 1.71. He became hypotensive, which was not responsive to 2.5L IVF. He was started on levophed for BP support. PCCM was asked to admit.    Past Medical History   has a past medical history of Diabetes mellitus without complication ([place]) and Hypertension.   Significant Hospital Events   6/18 admit  Consults:  IR  Procedures:     Significant Diagnostic Tests:  CT abdomen 6/18 > mild sigmoid colonic diverticulitis, enhancing lesion within the right liver lobe.   MRI liver 6/21> liver lesion- abscess vs biliary cystadenoma or cystadenocarcinoma, cystic metastatic disease, atypical hemangioma.  Echocardiogram 6/21 pending  Micro Data:  Blood 6/18 > Urine 6/18 >  Antimicrobials:  Cefepime 6/18 > Vancomycin 6/18 > Flagyl 6/18 >  Interim history/subjective:  Denies abdominal pain. Has LLE edema. No SOB.  Objective   Blood pressure 124/78, pulse 81, temperature 97.9 F (36.6 C), temperature source Oral, resp. rate (!) 21, height 5\' 9"  (1.753 m), weight 75  kg, SpO2 95 %.        Intake/Output Summary (Last 24 hours) at 05/31/2020 1057 Last data filed at 05/31/2020 1000 Gross per 24 hour  Intake 1585.3 ml  Output --  Net 1585.3 ml   Filed Weights   05/29/20 0500 05/30/20 0500 05/31/20 0500  Weight: 73.6 kg 74.6 kg 75 kg    Examination: General: Healthy-appearing middle-aged man lying in bed no acute distress HENT: [place]/AT, eyes anicteric Lungs: Breathing comfortably on room air, clear to auscultation bilaterally. Cardiovascular: Regular rate and rhythm, no murmurs Abdomen: Soft, nontender throughout, nondistended Extremities: Left lower extremity edema, no other edema.  No clubbing or cyanosis Neuro: Awake and alert, answering questions appropriately, moving all extremities.  Resolved Hospital Problem list     Assessment & Plan:   Submassive unprovoked pulmonary embolism secondary to left leg DVT with hypotension responsive to volume resuscitation.   -Does not meet criteria for intervention or thrombolysis. -Echocardiogram pending -Will wait to transition to [place] until after IR biopsy.  Continue heparin.  Duration of outpatient therapy may depend on biopsy results.  Cystic liver lesion MRI -IR biopsy tomorrow.  Will discontinue heparin few hours prior -N.p.o. past midnight  left leg pain likely due to DVT -Continue to hold gout medication.  DM -Accu-Cheks every 4 hours with sliding scale insulin as needed -Goal BG 140-181 admitted to the ICU  AKI: prerenal azotemia secondary to shock -Strict I's/O -Renally dose meds and avoid nephrotoxic meds  Hypertension -Continue holding PTA antihypertensives  LLE gout: new dx -Have stopped  home allopurinol and colchicine.  Stable to transfer out of the ICU.  Discussed his case with Dr. Rodena Piety, [facility] who will assume his care tomorrow.    Best practice:  Diet: NPO p midnight Pain/Anxiety/Delirium protocol (if indicated): NA VAP protocol (if indicated): NA DVT prophylaxis:  SQH GI prophylaxis: PPI Glucose control: CBG monitoring and SSI Mobility: BR Code Status: FULL Family Communication: Patient updated at bedside Disposition: SD to telemetry floor  Labs   CBC: Recent Labs  Lab 05/27/20 2311 05/28/20 0356 05/29/20 0848 05/30/20 0213 05/31/20 0313  WBC 18.4* 22.1* 11.9* 9.7 8.7  NEUTROABS 16.7*  --  9.0* 6.7 5.5  HGB 13.7 12.9* 12.4* 12.4* 12.4*  HCT 40.8 38.0* 35.8* 35.4* 35.4*  MCV 94.7 94.3 91.8 91.2 91.5  PLT 156 154 130* 153 233    Basic Metabolic Panel: Recent Labs  Lab 05/27/20 2311 05/28/20 0356 05/29/20 0848 05/31/20 0313  NA 134* 135 135 136  K 3.5 4.2 3.6 3.6  CL 101 102 104 103  CO2 18* 22 22 22   GLUCOSE 273* 213* 184* 125*  BUN 28* 27* 23 14  CREATININE 1.71* 1.60* 1.31* 1.13  CALCIUM 8.5* 8.4* 8.0* 8.0*   GFR: Estimated Creatinine Clearance: 68.6 mL/min (by C-G formula based on SCr of 1.13 mg/dL). Recent Labs  Lab 05/27/20 2311 05/27/20 2311 05/28/20 0235 05/28/20 0356 05/28/20 0820 05/29/20 0848 05/30/20 0213 05/31/20 0313  WBC 18.4*   < >  --  22.1*  --  11.9* 9.7 8.7  LATICACIDVEN 4.0*  --  2.6* 2.6* 1.8  --   --   --    < > = values in this interval not displayed.    Liver Function Tests: Recent Labs  Lab 05/27/20 2311 05/28/20 0356  AST 35 43*  ALT 44 49*  ALKPHOS 98 88  BILITOT 1.5* 1.2  PROT 6.3* 5.9*  ALBUMIN 3.1* 2.7*   Recent Labs  Lab 05/27/20 2311  LIPASE 26   No results for input(s): AMMONIA in the last 168 hours.  ABG No results found for: PHART, PCO2ART, PO2ART, HCO3, TCO2, ACIDBASEDEF, O2SAT   Coagulation Profile: No results for input(s): INR, PROTIME in the last 168 hours.  Cardiac Enzymes: Recent Labs  Lab 05/27/20 2311  CKTOTAL 152    HbA1C: Hgb A1c MFr Bld  Date/Time Value Ref Range Status  05/28/2020 03:56 AM 7.0 (H) 4.8 - 5.6 % Final    Comment:    (NOTE)         Prediabetes: 5.7 - 6.4         Diabetes: >6.4         Glycemic control for adults with  diabetes: <7.0     CBG: Recent Labs  Lab 05/30/20 1612 05/30/20 1946 05/30/20 2352 05/31/20 0404 05/31/20 0729  GLUCAP 121* 143* 106* 120* 127*       Julian Hy, DO 05/31/20 12:33 PM [facility]

## 2020-05-31 NOTE — Consult Note (Signed)
Chief Complaint: Liver lesion  with diagnostic labs  Referring Physician(s): Dr. Renaldo Harrison  Supervising Physician: Aletta Edouard  Patient Status: [facility] - In-pt  History of Present Illness: Fernando Rhodes is a 64 y.o. male History of DM, presumable gout. Presented to this facility with  malaise and weakness and a  syncopal episode. Patient was found to be in septic shock, febrile with leukocytosis and elevated lactic acid. Suspected VTE not gout. Found to have a bilateral PE with right heart strain. Patient on a heparin gtt. Incidental finding of a hepatic mass. Team is requesting liver aspiration for further determination.    Past Medical History:  Diagnosis Date  . Diabetes mellitus without complication ([place])   . Hypertension     Past Surgical History:  Procedure Laterality Date  . ABDOMINAL SURGERY      Allergies: Patient has no known allergies.  Medications: Prior to Admission medications   Medication Sig Start Date End Date Taking? Authorizing Provider  acetaminophen (TYLENOL) 500 MG tablet Take 1,000 mg by mouth every 6 (six) hours as needed for headache (pain).   Yes [provider]  allopurinol (ZYLOPRIM) 100 MG tablet Take 1 tablet (100 mg total) by mouth daily. 05/25/20  Yes Newt Minion, MD  Ascorbic Acid (VITAMIN C) 1000 MG tablet Take 1,000 mg by mouth daily.   Yes [provider]  CALCIUM-MAGNESIUM-VITAMIN D PO Take 1 tablet by mouth daily.   Yes [provider]  Colchicine 0.6 MG CAPS Take 0.6 mg by mouth daily as needed. Patient taking differently: Take 0.6 mg by mouth daily as needed (gout pain). "Mitigare" 05/25/20  Yes Newt Minion, MD  lisinopril-hydrochlorothiazide (PRINZIDE,ZESTORETIC) 20-12.5 MG per tablet [place] DAY Patient taking differently: Take 1 tablet by mouth daily.  07/30/11  Yes Denita Lung, MD  simvastatin (ZOCOR) 20 MG tablet Take 20 mg by mouth at bedtime.    Yes [provider]  vitamin B-12 (CYANOCOBALAMIN) 1000 MCG tablet Take 1,000 mcg by mouth daily.   Yes [provider]     History reviewed. No pertinent family history.  Social History   Socioeconomic History  . Marital status: Single    Spouse name: Not on file  . Number of children: Not on file  . Years of education: Not on file  . Highest education level: Not on file  Occupational History  . Not on file  Tobacco Use  . Smoking status: Never Smoker  . Smokeless tobacco: Never Used  Substance and Sexual Activity  . Alcohol use: No  . Drug use: No  . Sexual activity: Not on file  Other Topics Concern  . Not on file  Social History Narrative  . Not on file   Social Determinants of Health   Financial Resource Strain:   . Difficulty of Paying Living Expenses:   Food Insecurity:   . Worried About Charity fundraiser in the Last Year:   . Arboriculturist in the Last Year:   Transportation Needs:   . Film/video editor (Medical):   Marland Kitchen Lack of Transportation (Non-Medical):   Physical Activity:   . Days of Exercise per Week:   . Minutes of Exercise per Session:   Stress:   . Feeling of Stress :   Social Connections:   . Frequency of Communication with Friends and Family:   . Frequency of Social Gatherings with Friends and Family:   . Attends Religious  Services:   . Active Member of Clubs or Organizations:   . Attends Archivist Meetings:   Marland Kitchen Marital Status:      Review of Systems: A 12 point ROS discussed and pertinent positives are indicated in the HPI above.  All other systems are negative.  Review of Systems  Constitutional: Negative for fever.  HENT: Negative for congestion.   Respiratory: Negative for cough and shortness of breath.   Cardiovascular: Negative for chest pain.  Gastrointestinal: Negative for abdominal pain.  Neurological: Negative for headaches.  Psychiatric/Behavioral: Negative for behavioral problems and confusion.     Vital Signs: BP 124/78 (BP Location: Left Arm)   Pulse 81   Temp 98 F (36.7 C) (Oral)   Resp (!) 21   Ht 5\' 9"  (1.753 m)   Wt 165 lb 5.5 oz (75 kg)   SpO2 95%   BMI 24.42 kg/m   Physical Exam Vitals and nursing note reviewed.  Constitutional:      Appearance: He is well-developed.  HENT:     Head: Normocephalic.  Cardiovascular:     Rate and Rhythm: Normal rate and regular rhythm.     Heart sounds: Normal heart sounds.  Pulmonary:     Effort: Pulmonary effort is normal.     Breath sounds: Normal breath sounds.  Musculoskeletal:        General: Normal range of motion.     Cervical back: Normal range of motion.  Skin:    General: Skin is dry.  Neurological:     Mental Status: He is alert and oriented to person, place, and time.     Imaging: CT ANGIO CHEST PE W OR WO CONTRAST  Result Date: 05/29/2020 CLINICAL DATA:  Lower extremity DVT EXAM: CT ANGIOGRAPHY CHEST WITH CONTRAST TECHNIQUE: Multidetector CT imaging of the chest was performed using the standard protocol during bolus administration of intravenous contrast. Multiplanar CT image reconstructions and MIPs were obtained to evaluate the vascular anatomy. CONTRAST:  28mL OMNIPAQUE IOHEXOL 350 MG/ML SOLN COMPARISON:  None. FINDINGS: Cardiovascular: Heart size normal. No pericardial effusion. RV/LV ratio 0.9. Saddle pulmonary embolus with occlusive partially occlusive central emboli bilaterally, right worse than left. Subsegmental left upper lobe and segmental right lower lobe pulmonary emboli. Delayed opacification of right pulmonary veins. Adequate contrast opacification of the thoracic aorta with no evidence of dissection, aneurysm, or stenosis. There is bovine variant brachiocephalic arch anatomy without proximal stenosis. Mediastinum/Nodes: No mediastinal hematoma. No hilar or mediastinal adenopathy. Lungs/Pleura: Trace left pleural fluid. No pneumothorax. Dependent atelectasis posteriorly in both lower lobes. No  pulmonary nodular infarct. Upper Abdomen: Subcentimeter hyperdense focus in the dependent aspect of the nondilated gallbladder suggesting calculus. 3.1 cm low-attenuation lesion in segment 8, as previously noted, nonspecific. No acute abdominal findings. Musculoskeletal: Spondylitic changes in the lower cervical spine. No fracture or worrisome bone lesion. Review of the MIP images confirms the above findings. IMPRESSION: 1. Positive for acute saddle, bilateral central, and segmental PE with CT suggestion of right heart strain (RV/LV Ratio = 0.9) consistent with at least submassive (intermediate risk) PE. The presence of right heart strain has been associated with an increased risk of morbidity and mortality. Critical Value/emergent results were called by telephone at the time of interpretation on 05/29/2020 at 11:22 am to provider [facility] , who verbally acknowledged these results. Electronically Signed   By: Lucrezia Europe M.D.   On: 05/29/2020 11:24   CT ABDOMEN PELVIS W CONTRAST  Result Date: 05/28/2020 CLINICAL DATA:  Sepsis  abdominal pain EXAM: CT ABDOMEN AND PELVIS WITH CONTRAST TECHNIQUE: Multidetector CT imaging of the abdomen and pelvis was performed using the standard protocol following bolus administration of intravenous contrast. CONTRAST:  4mL OMNIPAQUE IOHEXOL 300 MG/ML  SOLN COMPARISON:  None. FINDINGS: Lower chest: The visualized heart size within normal limits. No pericardial fluid/thickening. No hiatal hernia. The visualized portions of the lungs are clear. Hepatobiliary: There is a heterogeneous peripherally enhancing lesion within the right liver lobe measuring 2.3 by 2.0 cm. Large diffuse low density seen throughout the liver parenchyma.The main portal vein is patent. No evidence of calcified gallstones, gallbladder wall thickening or biliary dilatation. Pancreas: Unremarkable. No pancreatic ductal dilatation or surrounding inflammatory changes. Spleen: Normal in size without focal  abnormality. Adrenals/Urinary Tract: Both adrenal glands appear normal. The kidneys and collecting system appear normal without evidence of urinary tract calculus or hydronephrosis. Bladder is unremarkable. Stomach/Bowel: The stomach, small bowel, are normal in appearance. The appendix is unremarkable. There appears to be mild wall thickening within the sigmoid colon with diverticula and mild fat stranding changes. No pericolonic fluid collections or free air seen. Vascular/Lymphatic: There are no enlarged mesenteric, retroperitoneal, or pelvic lymph nodes. Scattered aortic atherosclerotic calcifications are seen without aneurysmal dilatation. Reproductive: The prostate is unremarkable. Other: No evidence of abdominal wall mass or hernia. Musculoskeletal: No acute or significant osseous findings. 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 IMPRESSION: 1. Mild sigmoid colonic diverticulitis. No pericolonic fluid collections or free air. 2. Heterogeneously enhancing lesion within the right liver lobe which is non-specific could represent a hemangioma versus other solid hepatic lesion. For further evaluation would recommend non emergent MRI. 3.  Aortic Atherosclerosis (ICD10-I70.0). Electronically Signed   By: Prudencio Pair M.D.   On: 05/28/2020 03:17   MR LIVER W WO CONTRAST  Result Date: 05/31/2020 CLINICAL DATA:  Hepatic mass for further characterization. EXAM: MRI ABDOMEN WITHOUT AND WITH CONTRAST TECHNIQUE: Multiplanar multisequence MR imaging of the abdomen was performed both before and after the administration of intravenous contrast. CONTRAST:  36mL GADAVIST GADOBUTROL 1 MMOL/ML IV SOLN COMPARISON:  CT abdomen 05/28/2020 and CT chest from 05/29/2020 FINDINGS: Despite efforts by the technologist and patient, motion artifact is present on today's exam and could not be eliminated. This reduces exam sensitivity and specificity. Lower chest: The patient has known pulmonary embolus visible on postcontrast images  such image 61/12. This was also described on yesterday's CT angiography of the chest. Hepatobiliary: The lesion of concern in the right hepatic lobe measures 2.8 by 2.2 by 2.4 cm with primarily high T2 signal and low precontrast T1 signal. There is somewhat internal and enhancing thickening septation. On delayed images the margins suggest a pseudo capsule aside from potential mild thickening of the appearance of this lambdoid internal portion substantial restriction of diffusion on high B value images. In segment 3 of the liver, a 0.6 by 0.3 cm T2 hyperintense lesion does not enhance and is most compatible with a simple cyst. No significant biliary dilatation.  Gallbladder unremarkable. Pancreas:  Unremarkable Spleen:  Unremarkable Adrenals/Urinary Tract: 7 mm cyst in the right kidney lower pole medially on image 40/12. Other smaller nonenhancing renal lesions favor cysts. Adrenal glands unremarkable. Stomach/Bowel: Unremarkable Vascular/Lymphatic:  Unremarkable Other:  Unremarkable Musculoskeletal: Mild lumbar spondylosis and degenerative disc disease. IMPRESSION: 1. Probable cystic lesion in the dome of the right hepatic lobe with thick enhancing septations and some evidence of septation along its margins on the delayed images. Possibilities include abscess, biliary cystadenoma/cystadenocarcinoma, cystic metastatic disease,  or atypical hemangioma. 2. The patient has known pulmonary embolus visible on postcontrast images such image 61/12. This was also described on yesterday's CT angiography of the chest. 3. Small simple cyst in segment 3 of the liver. 4. Mild lumbar spondylosis and degenerative disc disease. 5. Despite efforts by the technologist and patient, motion artifact is present on today's exam and could not be eliminated. This reduces exam sensitivity and specificity. Electronically Signed   By: Van Clines M.D.   On: 05/31/2020 08:41   DG Chest Port 1 View  Result Date: 05/27/2020 CLINICAL  DATA:  Fever EXAM: PORTABLE CHEST 1 VIEW COMPARISON:  None. FINDINGS: Single frontal view of the chest demonstrates an unremarkable cardiac silhouette. No airspace disease, effusion, or pneumothorax. No acute bony abnormalities. IMPRESSION: 1. No acute intrathoracic process. Electronically Signed   By: Randa Ngo M.D.   On: 05/27/2020 23:43   XR Foot 2 Views Left  Result Date: 05/25/2020 2 view radiographs of the left foot shows previous talonavicular subtalar and calcaneocuboid fusion with some fibrous union of the talonavicular there is also advanced degenerative changes with the navicular and medial cuneiform.  There is an extremely plantarflexed first ray.  VAS [place] LOWER EXTREMITY VENOUS (DVT)  Result Date: 05/29/2020  Lower Venous DVT Study Indications: Edema.  Risk Factors: Gout. Comparison Study: No prior study on file for comparison Performing Technologist: Sharion Dove RVS  Examination Guidelines: A complete evaluation includes B-mode imaging, spectral Doppler, color Doppler, and power Doppler as needed of all accessible portions of each vessel. Bilateral testing is considered an integral part of a complete examination. Limited examinations for reoccurring indications may be performed as noted. The reflux portion of the exam is performed with the patient in reverse Trendelenburg.  +-----+---------------+---------+-----------+----------+--------------+ RIGHTCompressibilityPhasicitySpontaneityPropertiesThrombus Aging +-----+---------------+---------+-----------+----------+--------------+ CFV  Full           Yes      Yes                                 +-----+---------------+---------+-----------+----------+--------------+   +---------+---------------+---------+-----------+----------+--------------+ LEFT     CompressibilityPhasicitySpontaneityPropertiesThrombus Aging +---------+---------------+---------+-----------+----------+--------------+ CFV      Full           Yes       Yes                                 +---------+---------------+---------+-----------+----------+--------------+ SFJ      Full                                                        +---------+---------------+---------+-----------+----------+--------------+ FV Prox  None                                         Acute          +---------+---------------+---------+-----------+----------+--------------+ FV Mid   None                                         Acute          +---------+---------------+---------+-----------+----------+--------------+  FV DistalNone                                         Acute          +---------+---------------+---------+-----------+----------+--------------+ PFV      Full                                                        +---------+---------------+---------+-----------+----------+--------------+ POP      None           No       No                   Acute          +---------+---------------+---------+-----------+----------+--------------+ PTV      None                                         Acute          +---------+---------------+---------+-----------+----------+--------------+ PERO     None                                         Acute          +---------+---------------+---------+-----------+----------+--------------+ Gastroc  None                                         Acute          +---------+---------------+---------+-----------+----------+--------------+     Summary: RIGHT: - No evidence of common femoral vein obstruction.  LEFT: - Findings consistent with acute deep vein thrombosis involving the left femoral vein, left popliteal vein, left posterior tibial veins, left peroneal veins, and left gastrocnemius veins. - Ultrasound characteristics of enlarged lymph nodes noted in the groin.  *See table(s) above for measurements and observations. Electronically signed by Servando Snare MD on 05/29/2020 at 11:16:10 AM.     Final     Labs:  CBC: Recent Labs    05/28/20 0356 05/29/20 0848 05/30/20 0213 05/31/20 0313  WBC 22.1* 11.9* 9.7 8.7  HGB 12.9* 12.4* 12.4* 12.4*  HCT 38.0* 35.8* 35.4* 35.4*  PLT 154 130* 153 178    COAGS: No results for input(s): INR, APTT in the last 8760 hours.  BMP: Recent Labs    05/27/20 2311 05/28/20 0356 05/29/20 0848 05/31/20 0313  NA 134* 135 135 136  K 3.5 4.2 3.6 3.6  CL 101 102 104 103  CO2 18* 22 22 22   GLUCOSE 273* 213* 184* 125*  BUN 28* 27* 23 14  CALCIUM 8.5* 8.4* 8.0* 8.0*  CREATININE 1.71* 1.60* 1.31* 1.13  GFRNONAA 42* 46* 58* >60  GFRAA 49* 53* >60 >60    LIVER FUNCTION TESTS: Recent Labs    05/27/20 2311 05/28/20 0356  BILITOT 1.5* 1.2  AST 35 43*  ALT 44 49*  ALKPHOS 98 88  PROT 6.3* 5.9*  ALBUMIN 3.1* 2.7*    Assessment and Plan:  64 y.o, male  inpatient. History of DM, presumable gout. Presented to this facility with  malaise and weakness and a  syncopal episode. Patient was found to be in septic shock, febrile with leukocytosis and elevated lactic acid. Suspected VTE not gout. Found to have a bilateral PE with right heart strain. Patient on a heparin gtt. Incidental finding of a hepatic mass. Team is requesting liver aspiration for further determination.     Pertinent Imaging 6.18.21 - CT abd pelvis reads Heterogeneously enhancing lesion within the right liver lobe which is non-specific could represent a hemangioma versus other solid hepatic lesion. For further evaluation would recommend non emergent MRI.  Pertinent IR History none  Pertinent Allergies NKDA   All labs  are within acceptable parameters.  Patient is on a heparin gtt. Patient is afebrile.  IR consulted for possible liver aspiration. Case has been reviewed and procedure approved by Dr. Kathlene Cote.  Patient tentatively scheduled for 6.22.21.  Team instructed to: Keep Patient to be NPO after midnight Hold Heparin gtt at least 4 hours prior to  procedure   IR will call patient when ready.  Risks and benefits discussed with the patient including bleeding, infection, damage to adjacent structures, bowel perforation/fistula connection, and sepsis.  All of the patient's questions were answered, patient is agreeable to proceed. Consent signed and in chart.   Thank you for this interesting consult.  I greatly enjoyed meeting Dayvian Blixt and look forward to participating in their care.  A copy of this report was sent to the requesting provider on this date.  Electronically Signed: Avel Peace, NP 05/31/2020, 12:30 PM   I spent a total of 40 Minutes    in face to face in clinical consultation, greater than 50% of which was counseling/coordinating care for liver lesion aspiration

## 2020-05-31 NOTE — Telephone Encounter (Signed)
Needs follow up in Pulmonary clinic for submassive PE in ~4 weeks. Appt requested.  Julian Hy, DO 05/31/20 4:52 PM [facility]

## 2020-05-31 NOTE — Progress Notes (Signed)
ANTICOAGULATION CONSULT NOTE  Pharmacy Consult for heparin Indication: pulmonary embolus  No Known Allergies  Patient Measurements: Height: 5\' 9"  (175.3 cm) Weight: 75 kg (165 lb 5.5 oz) IBW/kg (Calculated) : 70.7 Heparin Dosing Weight: 73.6  Vital Signs: Temp: 97.9 F (36.6 C) (06/21 0732) Temp Source: Oral (06/21 0732) BP: 123/71 (06/21 0606) Pulse Rate: 68 (06/21 0606)  Labs: Recent Labs    05/29/20 0848 05/29/20 1747 05/30/20 0213 05/30/20 0213 05/30/20 1050 05/30/20 1827 05/31/20 0313  HGB 12.4*  --  12.4*  --   --   --  12.4*  HCT 35.8*  --  35.4*  --   --   --  35.4*  PLT 130*  --  153  --   --   --  178  HEPARINUNFRC  --    < > 0.41   < > 0.30 0.23* 0.40  CREATININE 1.31*  --   --   --   --   --  1.13   < > = values in this interval not displayed.    Estimated Creatinine Clearance: 68.6 mL/min (by C-G formula based on SCr of 1.13 mg/dL).   Medical History: Past Medical History:  Diagnosis Date  . Diabetes mellitus without complication ([place])   . Hypertension     Assessment: Patient originally presenting with shock thought to be potentially septic is now with newly found saddle PE on CTA. Pharmacy has been asked to initiate heparin drip.  Heparin level now therapeutic at 0.4, per RN no infusion issues today.  Goal of Therapy:  Heparin level 0.3-0.7 units/ml Monitor platelets by anticoagulation protocol: Yes   Plan:  -Continue Heparin infusion at 1350 units/h -Recheck heparin level in 6h - Heparin level and CBC qam     Alanda Slim, PharmD, [facility] Clinical Pharmacist Please see AMION for all Pharmacists' Contact Phone Numbers 05/31/2020, 8:14 AM

## 2020-05-31 NOTE — Plan of Care (Signed)
Wife updated at bedside about MRI results and need for procedure with IR tomorrow.  Julian Hy, DO 05/31/20 5:03 PM [facility]

## 2020-06-01 ENCOUNTER — Inpatient Hospital Stay (HOSPITAL_COMMUNITY): Payer: 59

## 2020-06-01 DIAGNOSIS — N289 Disorder of kidney and ureter, unspecified: Secondary | ICD-10-CM

## 2020-06-01 DIAGNOSIS — R52 Pain, unspecified: Secondary | ICD-10-CM

## 2020-06-01 DIAGNOSIS — Z8739 Personal history of other diseases of the musculoskeletal system and connective tissue: Secondary | ICD-10-CM

## 2020-06-01 DIAGNOSIS — Z86711 Personal history of pulmonary embolism: Secondary | ICD-10-CM | POA: Diagnosis present

## 2020-06-01 DIAGNOSIS — I70209 Unspecified atherosclerosis of native arteries of extremities, unspecified extremity: Secondary | ICD-10-CM | POA: Diagnosis present

## 2020-06-01 DIAGNOSIS — K5792 Diverticulitis of intestine, part unspecified, without perforation or abscess without bleeding: Secondary | ICD-10-CM | POA: Diagnosis present

## 2020-06-01 DIAGNOSIS — E119 Type 2 diabetes mellitus without complications: Secondary | ICD-10-CM

## 2020-06-01 DIAGNOSIS — K750 Abscess of liver: Secondary | ICD-10-CM | POA: Diagnosis present

## 2020-06-01 DIAGNOSIS — I2699 Other pulmonary embolism without acute cor pulmonale: Secondary | ICD-10-CM | POA: Diagnosis present

## 2020-06-01 HISTORY — DX: Disorder of kidney and ureter, unspecified: N28.9

## 2020-06-01 HISTORY — PX: IR US GUIDE BX ASP/DRAIN: IMG2392

## 2020-06-01 LAB — CBC WITH DIFFERENTIAL/PLATELET
Abs Immature Granulocytes: 0.08 10*3/uL — ABNORMAL HIGH (ref 0.00–0.07)
Basophils Absolute: 0.1 10*3/uL (ref 0.0–0.1)
Basophils Relative: 1 %
Eosinophils Absolute: 0.2 10*3/uL (ref 0.0–0.5)
Eosinophils Relative: 2 %
HCT: 37.3 % — ABNORMAL LOW (ref 39.0–52.0)
Hemoglobin: 13.3 g/dL (ref 13.0–17.0)
Immature Granulocytes: 1 %
Lymphocytes Relative: 23 %
Lymphs Abs: 2.2 10*3/uL (ref 0.7–4.0)
MCH: 32.1 pg (ref 26.0–34.0)
MCHC: 35.7 g/dL (ref 30.0–36.0)
MCV: 90.1 fL (ref 80.0–100.0)
Monocytes Absolute: 0.8 10*3/uL (ref 0.1–1.0)
Monocytes Relative: 8 %
Neutro Abs: 6 10*3/uL (ref 1.7–7.7)
Neutrophils Relative %: 65 %
Platelets: 203 10*3/uL (ref 150–400)
RBC: 4.14 MIL/uL — ABNORMAL LOW (ref 4.22–5.81)
RDW: 11.9 % (ref 11.5–15.5)
WBC: 9.3 10*3/uL (ref 4.0–10.5)
nRBC: 0 % (ref 0.0–0.2)

## 2020-06-01 LAB — GLUCOSE, CAPILLARY
Glucose-Capillary: 118 mg/dL — ABNORMAL HIGH (ref 70–99)
Glucose-Capillary: 124 mg/dL — ABNORMAL HIGH (ref 70–99)
Glucose-Capillary: 107 mg/dL — ABNORMAL HIGH (ref 70–99)
Glucose-Capillary: 107 mg/dL — ABNORMAL HIGH (ref 70–99)
Glucose-Capillary: 165 mg/dL — ABNORMAL HIGH (ref 70–99)
Glucose-Capillary: 113 mg/dL — ABNORMAL HIGH (ref 70–99)

## 2020-06-01 LAB — HEPARIN LEVEL (UNFRACTIONATED): Heparin Unfractionated: 0.37 IU/mL (ref 0.30–0.70)

## 2020-06-01 MED ORDER — FENTANYL CITRATE (PF) 100 MCG/2ML IJ SOLN
INTRAMUSCULAR | Status: AC
  Filled 2020-06-01: qty 4

## 2020-06-01 MED ORDER — SODIUM CHLORIDE 0.9 % IV SOLN
2.0000 g | INTRAVENOUS | Status: DC
  Administered 2020-06-01 – 2020-06-04 (×4): 2 g via INTRAVENOUS
  Filled 2020-06-01 (×4): qty 2

## 2020-06-01 MED ORDER — MIDAZOLAM HCL 2 MG/2ML IJ SOLN
INTRAMUSCULAR | Status: AC
  Filled 2020-06-01: qty 4

## 2020-06-01 MED ORDER — METRONIDAZOLE IN NACL 5-0.79 MG/ML-% IV SOLN
500.0000 mg | Freq: Three times a day (TID) | INTRAVENOUS | Status: DC
  Administered 2020-06-01 – 2020-06-04 (×9): 500 mg via INTRAVENOUS
  Filled 2020-06-01 (×9): qty 100

## 2020-06-01 MED ORDER — LIDOCAINE HCL 1 % IJ SOLN
INTRAMUSCULAR | Status: AC
  Filled 2020-06-01: qty 20

## 2020-06-01 MED ORDER — HEPARIN (PORCINE) 25000 UT/250ML-% IV SOLN
1550.0000 [IU]/h | INTRAVENOUS | Status: DC
  Administered 2020-06-01: 1350 [IU]/h via INTRAVENOUS
  Filled 2020-06-01: qty 250

## 2020-06-01 MED ORDER — LIDOCAINE HCL 1 % IJ SOLN
INTRAMUSCULAR | Status: AC | PRN
  Administered 2020-06-01: 5 mL

## 2020-06-01 MED ORDER — SODIUM CHLORIDE 0.9 % IV SOLN: 1.0000 g | INTRAVENOUS | Status: DC

## 2020-06-01 MED ORDER — MIDAZOLAM HCL 2 MG/2ML IJ SOLN
INTRAMUSCULAR | Status: AC | PRN
  Administered 2020-06-01: 1 mg via INTRAVENOUS
  Administered 2020-06-01: 0.5 mg via INTRAVENOUS

## 2020-06-01 MED ORDER — FENTANYL CITRATE (PF) 100 MCG/2ML IJ SOLN
INTRAMUSCULAR | Status: AC | PRN
  Administered 2020-06-01: 25 ug via INTRAVENOUS
  Administered 2020-06-01: 50 ug via INTRAVENOUS

## 2020-06-01 NOTE — Progress Notes (Addendum)
ANTICOAGULATION CONSULT NOTE  Pharmacy Consult for heparin Indication: pulmonary embolus  No Known Allergies  Patient Measurements: Height: 5\' 9"  (175.3 cm) Weight: 73.3 kg (161 lb 9.6 oz) IBW/kg (Calculated) : 70.7 Heparin Dosing Weight: 73.6  Vital Signs: Temp: 97.6 F (36.4 C) (06/22 0700) Temp Source: Axillary (06/22 0700) BP: 134/76 (06/22 0700) Pulse Rate: 66 (06/22 0700)  Labs: Recent Labs    05/30/20 0213 05/30/20 1050 05/31/20 0313 05/31/20 0313 05/31/20 0856 05/31/20 1629 06/01/20 0303  HGB 12.4*  --  12.4*  --   --   --  13.3  HCT 35.4*  --  35.4*  --   --   --  37.3*  PLT 153  --  178  --   --   --  203  HEPARINUNFRC 0.41   < > 0.40   < > 0.42 0.41 0.37  CREATININE  --   --  1.13  --   --   --   --    < > = values in this interval not displayed.    Estimated Creatinine Clearance: 68.6 mL/min (by C-G formula based on SCr of 1.13 mg/dL).   Medical History: Past Medical History:  Diagnosis Date   Diabetes mellitus without complication ([place])    Hypertension     Assessment: Patient originally presenting with shock thought to be potentially septic is now with newly found saddle PE on CTA. Pharmacy has been asked to initiate heparin drip.  Heparin level remains therapeutic on AM level at 1350 units/hr. Patient going for hepatic abscess aspiration/biposy today. Heparin was turned off at 7:58 AM in anticipation of procedure (4hrs prior).   Goal of Therapy:  Heparin level 0.3-0.7 units/ml Monitor platelets by anticoagulation protocol: Yes   Plan:  -Heparin on hold for IR procedure -Will follow-up post IR for anticoagulation restart plans  Sloan Leiter, PharmD, BCPS, BCCCP Clinical Pharmacist Please refer to [facility] for [place] numbers  06/01/2020 10:44 AM   [facility] pharmacy phone numbers are listed on [email or web address]

## 2020-06-01 NOTE — Progress Notes (Addendum)
ANTICOAGULATION CONSULT NOTE  Pharmacy Consult for heparin Indication: pulmonary embolus  No Known Allergies  Patient Measurements: Height: 5\' 9"  (175.3 cm) Weight: 73.3 kg (161 lb 9.6 oz) IBW/kg (Calculated) : 70.7 Heparin Dosing Weight: 73.6  Vital Signs: Temp: 98.7 F (37.1 C) (06/22 1516) Temp Source: Oral (06/22 1516) BP: 117/74 (06/22 1516) Pulse Rate: 67 (06/22 1516)  Labs: Recent Labs    05/30/20 0213 05/30/20 1050 05/31/20 0313 05/31/20 0313 05/31/20 0856 05/31/20 1629 06/01/20 0303  HGB 12.4*  --  12.4*  --   --   --  13.3  HCT 35.4*  --  35.4*  --   --   --  37.3*  PLT 153  --  178  --   --   --  203  HEPARINUNFRC 0.41   < > 0.40   < > 0.42 0.41 0.37  CREATININE  --   --  1.13  --   --   --   --    < > = values in this interval not displayed.    Estimated Creatinine Clearance: 68.6 mL/min (by C-G formula based on SCr of 1.13 mg/dL).   Medical History: Past Medical History:  Diagnosis Date  . Diabetes mellitus without complication ([place])   . Hypertension     Assessment: Patient originally presenting with shock thought to be potentially septic is now with newly found saddle PE on CTA. Pharmacy has been asked to initiate heparin drip.  Heparin level remains therapeutic on AM level at 1350 units/hr. Patient going for hepatic abscess aspiration/biposy today. Heparin was turned off at 7:58 AM in anticipation of procedure (4hrs prior).   PM f/u - now s/p biopsy.  Discussed with Dr. Annamaria Boots, okay to resume heparin at 1720 PM.  Was previously therapeutic on heparin at 1350 units/hr.  Goal of Therapy:  Heparin level 0.3-0.7 units/ml Monitor platelets by anticoagulation protocol: Yes   Plan:  -Resume IV heparin at rate of 1350 units/hr at 1720 PM (no bolus). -Check heparin level 8 hrs after drip restarts. -Daily heparin level and CBC.   [place] Crane, Roylene Reason, BCCP Clinical Pharmacist  06/01/2020 3:26 PM   [facility] pharmacy phone numbers are listed on  [email or web address]

## 2020-06-01 NOTE — Progress Notes (Addendum)
PROGRESS NOTE    Fernando Rhodes  KPT:[id number] DOB: 06-Sep-1956 DOA: 05/27/2020 PCP: Antony Blackbird, MD   Brief Narrative: 64 year old with past medical history significant for diabetes, hypertension who was recently evaluated for gout orthopedic office.  He was a started on allopurinol and colchicine within the past couple of days.  He was in his usual state of health until 6/17 when he developed malaise and weakness.  Patient had a syncopal episode while urinating witnessed by his wife.  On EMS arrival patient temperature was 103.  He was given Tylenol and was transferred to the ED.  On arrival to the ED he was hemodynamically stable.  He had a white count of 18, lactic acid of 4, creatinine 1.7.  He became hypotensive not responsive to 2 l of IV fluids.  He was a started on Levophed and admitted by CCM on 6/18.  CT abdomen showed mild sigmoid colonic diverticulitis, enhancing lesion within the right liver lobe.  Was a started on IV antibiotics for presumed sigmoid diverticulitis.  He will received 2 days of IV antibiotics.   Patient also underwent CT angio chest on 6/19 positive for acute saddle, bilateral central and segmental PE with CT suggestion of right heart strain, consistent with at least submassive PE.   Assessment & Plan:   Active Problems:   Septic shock (HCC)  Submassive PE; Secondary to Left DVT; causing hypotension;  Currently Hemodynamic stable.  ECHO normal Right ventricular function.  On heparin Gtt. Currently on hold, awaiting Liver biopsy.  Plan to resume anticoagulation 2 hours after procedure, discussed with Dr Annamaria Boots. Oharmacyst informed.   Acute DVT Left femoral, left popliteal left posterior tibial.  On IV heparin.  Left LE pain, likely related to DVT, less likely related to gout. Gout medication stopped.   Septic shock. Might have be related to Septic shock form liver abscess/diverticulitis or Submassive PE. Now resolved.   Hepatic lesion;  MRI liver  show abscess versus biliary cystadenoma or cystadenocarcinoma, cystic metastatic disease, atypical hemangioma. CCM arrange IR Liver biopsy for today. Holding heparin Depending on biopsy result, might need IV antibiotics if is consistent with abscess.  Addendum; S/P IR aspiration, finding consistent with Liver abscess. Will start IV ceftriaxone and continue with flagyl. ID consulted for 6/23.   Acute sigmoid Diverticulitis:  CT abdomen pelvis: Showed mild sigmoid diverticulitis. Presents with Leukocytosis, fever. Nurse report patient has been having loose stool. He received 2 days of IV antibiotic Cefepime, flagyl.   Started on flagyl.  Patient will need colonoscopy out patient.   AKI; Cr peak to 1.7. Improving. Down to 1.3  Received IV fluids.   HTN; holding diuretics due to hypotension and AKI.     Estimated body mass index is 23.86 kg/m as calculated from the following:   Height as of this encounter: 5\' 9"  (1.753 m).   Weight as of this encounter: 73.3 kg.   DVT prophylaxis: Heparin  Code Status: Full code Family Communication: Disposition Plan:  Status is: Inpatient  Remains inpatient appropriate because:Ongoing diagnostic testing needed not appropriate for outpatient work up   Dispo: The patient is from: Home              Anticipated d/c is to: Home              Anticipated d/c date is: 3 days              Patient currently is not medically stable to d/c.  Consultants:   CCM  IR  Procedures:   Liver biopsy  Antimicrobials:    Subjective: He is alert, denies abdominal pain.  Had loose stool today   Objective: Vitals:   06/01/20 0500 06/01/20 0600 06/01/20 0700 06/01/20 1100  BP:  (!) 151/80 134/76   Pulse: 66 79 66   Resp: (!) 24 18 (!) 21   Temp:   97.6 F (36.4 C) 98 F (36.7 C)  TempSrc:   Axillary Oral  SpO2: 93% 97% 95%   Weight: 73.3 kg     Height:        Intake/Output Summary (Last 24 hours) at 06/01/2020 1309 Last data  filed at 06/01/2020 0600 Gross per 24 hour  Intake 188.86 ml  Output --  Net 188.86 ml   Filed Weights   05/30/20 0500 05/31/20 0500 06/01/20 0500  Weight: 74.6 kg 75 kg 73.3 kg    Examination:  General exam: Appears calm and comfortable  Respiratory system: Clear to auscultation. Respiratory effort normal. Cardiovascular system: S1 & S2 heard, RRR. No JVD, murmurs, rubs, gallops or clicks. No pedal edema. Gastrointestinal system: Abdomen is nondistended, soft and nontender. No organomegaly or masses felt. Normal bowel sounds heard. Central nervous system: Alert and oriented. No focal neurological deficits. Extremities: Symmetric 5 x 5 power. Skin: No rashes, lesions or ulcers Psychiatry: Judgement and insight appear normal. Mood & affect appropriate.     Data Reviewed: I have personally reviewed following labs and imaging studies  CBC: Recent Labs  Lab 05/27/20 2311 05/27/20 2311 05/28/20 0356 05/29/20 0848 05/30/20 0213 05/31/20 0313 06/01/20 0303  WBC 18.4*   < > 22.1* 11.9* 9.7 8.7 9.3  NEUTROABS 16.7*  --   --  9.0* 6.7 5.5 6.0  HGB 13.7   < > 12.9* 12.4* 12.4* 12.4* 13.3  HCT 40.8   < > 38.0* 35.8* 35.4* 35.4* 37.3*  MCV 94.7   < > 94.3 91.8 91.2 91.5 90.1  PLT 156   < > 154 130* 153 178 203   < > = values in this interval not displayed.   Basic Metabolic Panel: Recent Labs  Lab 05/27/20 2311 05/28/20 0356 05/29/20 0848 05/31/20 0313  NA 134* 135 135 136  K 3.5 4.2 3.6 3.6  CL 101 102 104 103  CO2 18* 22 22 22   GLUCOSE 273* 213* 184* 125*  BUN 28* 27* 23 14  CREATININE 1.71* 1.60* 1.31* 1.13  CALCIUM 8.5* 8.4* 8.0* 8.0*   GFR: Estimated Creatinine Clearance: 68.6 mL/min (by C-G formula based on SCr of 1.13 mg/dL). Liver Function Tests: Recent Labs  Lab 05/27/20 2311 05/28/20 0356  AST 35 43*  ALT 44 49*  ALKPHOS 98 88  BILITOT 1.5* 1.2  PROT 6.3* 5.9*  ALBUMIN 3.1* 2.7*   Recent Labs  Lab 05/27/20 2311  LIPASE 26   No results for  input(s): AMMONIA in the last 168 hours. Coagulation Profile: No results for input(s): INR, PROTIME in the last 168 hours. Cardiac Enzymes: Recent Labs  Lab 05/27/20 2311  CKTOTAL 152   BNP (last 3 results) No results for input(s): PROBNP in the last 8760 hours. HbA1C: No results for input(s): HGBA1C in the last 72 hours. CBG: Recent Labs  Lab 05/31/20 1929 05/31/20 2320 06/01/20 0315 06/01/20 0722 06/01/20 1133  GLUCAP 124* 107* 118* 124* 107*   Lipid Profile: No results for input(s): CHOL, HDL, LDLCALC, TRIG, CHOLHDL, LDLDIRECT in the last 72 hours. Thyroid Function Tests: No results for input(s): TSH,  T4TOTAL, FREET4, T3FREE, THYROIDAB in the last 72 hours. Anemia Panel: No results for input(s): VITAMINB12, FOLATE, FERRITIN, TIBC, IRON, RETICCTPCT in the last 72 hours. Sepsis Labs: Recent Labs  Lab 05/27/20 2311 05/28/20 0235 05/28/20 0356 05/28/20 0820  LATICACIDVEN 4.0* 2.6* 2.6* 1.8    Recent Results (from the past 240 hour(s))  Blood culture (routine x 2)     Status: None (Preliminary result)   Collection Time: 05/27/20 11:00 PM   Specimen: BLOOD LEFT HAND  Result Value Ref Range Status   Specimen Description BLOOD LEFT HAND  Final   Special Requests   Final    BOTTLES DRAWN AEROBIC AND ANAEROBIC Blood Culture adequate volume   Culture   Final    NO GROWTH 4 DAYS Performed at [facility], [place] [address] [place], [place] [address]    Report Status PENDING  Incomplete  Blood culture (routine x 2)     Status: None (Preliminary result)   Collection Time: 05/27/20 11:05 PM   Specimen: BLOOD RIGHT HAND  Result Value Ref Range Status   Specimen Description BLOOD RIGHT HAND  Final   Special Requests   Final    BOTTLES DRAWN AEROBIC AND ANAEROBIC Blood Culture adequate volume   Culture   Final    NO GROWTH 4 DAYS Performed at [facility], [place] [address], [place], [place] [address]    Report Status PENDING  Incomplete  SARS Coronavirus 2 by  RT PCR (hospital order, performed in [facility] hospital lab) Nasopharyngeal Nasopharyngeal Swab     Status: None   Collection Time: 05/27/20 11:11 PM   Specimen: Nasopharyngeal Swab  Result Value Ref Range Status   SARS Coronavirus 2 NEGATIVE NEGATIVE Final    Comment: (NOTE) SARS-CoV-2 target nucleic acids are NOT DETECTED.  The SARS-CoV-2 RNA is generally detectable in upper and lower respiratory specimens during the acute phase of infection. The lowest concentration of SARS-CoV-2 viral copies this assay can detect is 250 copies / mL. A negative result does not preclude SARS-CoV-2 infection and should not be used as the sole basis for treatment or other patient management decisions.  A negative result may occur with improper specimen collection / handling, submission of specimen other than nasopharyngeal swab, presence of viral mutation(s) within the areas targeted by this assay, and inadequate number of viral copies (<250 copies / mL). A negative result must be combined with clinical observations, patient history, and epidemiological information.  Fact Sheet for Patients:   StrictlyIdeas.no  Fact Sheet for Healthcare Providers: BankingDealers.co.za  This test is not yet approved or  cleared by the [place] FDA and has been authorized for detection and/or diagnosis of SARS-CoV-2 by FDA under an Emergency Use Authorization (EUA).  This EUA will remain in effect (meaning this test can be used) for the duration of the COVID-19 declaration under Section 564(b)(1) of the Act, 21 U.S.C. section 360bbb-3(b)(1), unless the authorization is terminated or revoked sooner.  Performed at [facility], [place] [address] [place], [place] [address]   Urine culture     Status: None   Collection Time: 05/28/20  4:35 AM   Specimen: Urine, Random  Result Value Ref Range Status   Specimen Description URINE, RANDOM  Final   Special  Requests NONE  Final   Culture   Final    NO GROWTH Performed at [facility], [place] [address] [place], [place] [address]    Report Status 05/29/2020 FINAL  Final  MRSA PCR Screening     Status: None   Collection Time: 05/28/20  3:00 PM   Specimen: Nasal Mucosa; Nasopharyngeal  Result Value Ref Range Status   MRSA by PCR NEGATIVE NEGATIVE Final    Comment:        The GeneXpert MRSA Assay (FDA approved for NASAL specimens only), is one component of a comprehensive MRSA colonization surveillance program. It is not intended to diagnose MRSA infection nor to guide or monitor treatment for MRSA infections. Performed at [facility], [place] [address], [place], [place] [address]          Radiology Studies: MR LIVER W WO CONTRAST  Result Date: 05/31/2020 CLINICAL DATA:  Hepatic mass for further characterization. EXAM: MRI ABDOMEN WITHOUT AND WITH CONTRAST TECHNIQUE: Multiplanar multisequence MR imaging of the abdomen was performed both before and after the administration of intravenous contrast. CONTRAST:  33mL GADAVIST GADOBUTROL 1 MMOL/ML IV SOLN COMPARISON:  CT abdomen 05/28/2020 and CT chest from 05/29/2020 FINDINGS: Despite efforts by the technologist and patient, motion artifact is present on today's exam and could not be eliminated. This reduces exam sensitivity and specificity. Lower chest: The patient has known pulmonary embolus visible on postcontrast images such image 61/12. This was also described on yesterday's CT angiography of the chest. Hepatobiliary: The lesion of concern in the right hepatic lobe measures 2.8 by 2.2 by 2.4 cm with primarily high T2 signal and low precontrast T1 signal. There is somewhat internal and enhancing thickening septation. On delayed images the margins suggest a pseudo capsule aside from potential mild thickening of the appearance of this lambdoid internal portion substantial restriction of diffusion on high B value images. In segment 3 of  the liver, a 0.6 by 0.3 cm T2 hyperintense lesion does not enhance and is most compatible with a simple cyst. No significant biliary dilatation.  Gallbladder unremarkable. Pancreas:  Unremarkable Spleen:  Unremarkable Adrenals/Urinary Tract: 7 mm cyst in the right kidney lower pole medially on image 40/12. Other smaller nonenhancing renal lesions favor cysts. Adrenal glands unremarkable. Stomach/Bowel: Unremarkable Vascular/Lymphatic:  Unremarkable Other:  Unremarkable Musculoskeletal: Mild lumbar spondylosis and degenerative disc disease. IMPRESSION: 1. Probable cystic lesion in the dome of the right hepatic lobe with thick enhancing septations and some evidence of septation along its margins on the delayed images. Possibilities include abscess, biliary cystadenoma/cystadenocarcinoma, cystic metastatic disease, or atypical hemangioma. 2. The patient has known pulmonary embolus visible on postcontrast images such image 61/12. This was also described on yesterday's CT angiography of the chest. 3. Small simple cyst in segment 3 of the liver. 4. Mild lumbar spondylosis and degenerative disc disease. 5. Despite efforts by the technologist and patient, motion artifact is present on today's exam and could not be eliminated. This reduces exam sensitivity and specificity. Electronically Signed   By: Van Clines M.D.   On: 05/31/2020 08:41   ECHOCARDIOGRAM COMPLETE  Result Date: 05/31/2020    ECHOCARDIOGRAM REPORT   Patient Name:   Fernando Rhodes Date of Exam: 05/31/2020 Medical Rec #:  [id number]            Height:       69.0 in Accession #:    [id number]           Weight:       165.3 lb Date of Birth:  August 18, 1956            BSA:          1.906 m Patient Age:  63 years             BP:           126/110 mmHg Patient Gender: M                    HR:           75 bpm. Exam Location:  Inpatient Procedure: 2D Echo, 3D Echo, Color Doppler and Cardiac Doppler Indications:    I26.02 Pulmonary embolus  History:         Patient has no prior history of Echocardiogram examinations.                 Risk Factors:Hypertension and Diabetes. Saddle PE.  Sonographer:    Raquel Sarna Senior RDCS Referring Phys: [id number] [place]  1. Normal LV function; normal RV size and function.  2. Left ventricular ejection fraction, by estimation, is 60 to 65%. The left ventricle has normal function. The left ventricle has no regional wall motion abnormalities. Left ventricular diastolic parameters were normal.  3. Right ventricular systolic function is normal. The right ventricular size is normal. Tricuspid regurgitation signal is inadequate for assessing PA pressure.  4. The mitral valve is normal in structure. Trivial mitral valve regurgitation. No evidence of mitral stenosis.  5. The aortic valve is tricuspid. Aortic valve regurgitation is not visualized. No aortic stenosis is present.  6. Mildly dilated pulmonary artery.  7. The inferior vena cava is normal in size with greater than 50% respiratory variability, suggesting right atrial pressure of 3 mmHg. FINDINGS  Left Ventricle: Left ventricular ejection fraction, by estimation, is 60 to 65%. The left ventricle has normal function. The left ventricle has no regional wall motion abnormalities. The left ventricular internal cavity size was normal in size. There is  no left ventricular hypertrophy. Left ventricular diastolic parameters were normal. Right Ventricle: The right ventricular size is normal. Right ventricular systolic function is normal. Tricuspid regurgitation signal is inadequate for assessing PA pressure. The tricuspid regurgitant velocity is 2.27 m/s, and with an assumed right atrial  pressure of 3 mmHg, the estimated right ventricular systolic pressure is 25.0 mmHg. Left Atrium: Left atrial size was normal in size. Right Atrium: Right atrial size was normal in size. Pericardium: There is no evidence of pericardial effusion. Mitral Valve: The mitral valve is normal in  structure. Normal mobility of the mitral valve leaflets. Trivial mitral valve regurgitation. No evidence of mitral valve stenosis. Tricuspid Valve: The tricuspid valve is normal in structure. Tricuspid valve regurgitation is trivial. No evidence of tricuspid stenosis. Aortic Valve: The aortic valve is tricuspid. Aortic valve regurgitation is not visualized. No aortic stenosis is present. Pulmonic Valve: The pulmonic valve was normal in structure. Pulmonic valve regurgitation is not visualized. No evidence of pulmonic stenosis. Aorta: The aortic root is normal in size and structure. Pulmonary Artery: The pulmonary artery is mildly dilated. Venous: The inferior vena cava is normal in size with greater than 50% respiratory variability, suggesting right atrial pressure of 3 mmHg. IAS/Shunts: No atrial level shunt detected by color flow Doppler. Additional Comments: Normal LV function; normal RV size and function.  LEFT VENTRICLE PLAX 2D LVIDd:         3.90 cm  Diastology LVIDs:         2.70 cm  LV e' medial:   8.16 cm/s LV PW:         0.90 cm  LV E/e' medial: 6.8 LV IVS:  1.20 cm LVOT diam:     2.20 cm LV SV:         74 LV SV Index:   39 LVOT Area:     3.80 cm  RIGHT VENTRICLE RV S prime:     11.90 cm/s TAPSE (M-mode): 2.5 cm LEFT ATRIUM             Index       RIGHT ATRIUM           Index LA diam:        3.00 cm 1.57 cm/m  RA Area:     13.40 cm LA Vol (A2C):   37.3 ml 19.57 ml/m RA Volume:   30.20 ml  15.85 ml/m LA Vol (A4C):   23.2 ml 12.18 ml/m LA Biplane Vol: 29.5 ml 15.48 ml/m  AORTIC VALVE LVOT Vmax:   86.80 cm/s LVOT Vmean:  66.300 cm/s LVOT VTI:    0.194 m  AORTA Ao Root diam: 3.60 cm Ao Asc diam:  3.20 cm MITRAL VALVE               TRICUSPID VALVE MV Area (PHT): 3.72 cm    TR Peak grad:   20.6 mmHg MV Decel Time: 204 msec    TR Vmax:        227.00 cm/s MV E velocity: 55.70 cm/s MV A velocity: 48.00 cm/s  SHUNTS MV E/A ratio:  1.16        Systemic VTI:  0.19 m MV A Prime:    12.8 cm/s   Systemic  Diam: 2.20 cm Kirk Ruths MD Electronically signed by Kirk Ruths MD Signature Date/Time: 05/31/2020/2:24:39 PM    Final         Scheduled Meds: . Chlorhexidine Gluconate Cloth  6 each Topical Daily  . insulin aspart  0-15 Units Subcutaneous Q4H   Continuous Infusions: . sodium chloride Stopped (05/29/20 0525)  . heparin Stopped (06/01/20 0750)  . lactated ringers Stopped (05/31/20 0716)     LOS: 4 days    Time spent: 35 minutes    Ridhaan Dreibelbis A Ronae Noell, MD [facility]   If 7PM-7AM, please contact night-coverage [email or web address]  06/01/2020, 1:09 PM

## 2020-06-01 NOTE — Procedures (Signed)
Interventional Radiology Procedure Note  Procedure: [place] NEEDLE ASP OF THE RIGHT LIVER SMALL ABSCESS  Complications: None  Estimated Blood Loss: MIN  Findings: 12CC BLOODY PUS ASPIRATED SAMPLE SENT FOR CYTO AND CX

## 2020-06-01 NOTE — Telephone Encounter (Signed)
ATC patient LMTCB Patient currently admitted in hospital

## 2020-06-01 NOTE — Progress Notes (Signed)
Bilateral lower extremity venous duplex complete.  Please see CV proc tab for preliminary results.  Critical findings were reported through messenger to Dr. Tyrell Antonio @ 16:45. [place], RVT 4:45 PM  06/01/2020

## 2020-06-01 NOTE — Telephone Encounter (Signed)
Went ahead and scheduled patient for 7/12 at 10am with Wyn Quaker

## 2020-06-02 DIAGNOSIS — I10 Essential (primary) hypertension: Secondary | ICD-10-CM

## 2020-06-02 DIAGNOSIS — N289 Disorder of kidney and ureter, unspecified: Secondary | ICD-10-CM

## 2020-06-02 DIAGNOSIS — O2230 Deep phlebothrombosis in pregnancy, unspecified trimester: Secondary | ICD-10-CM

## 2020-06-02 DIAGNOSIS — K5792 Diverticulitis of intestine, part unspecified, without perforation or abscess without bleeding: Secondary | ICD-10-CM

## 2020-06-02 DIAGNOSIS — K750 Abscess of liver: Secondary | ICD-10-CM

## 2020-06-02 LAB — CULTURE, BLOOD (ROUTINE X 2)
Culture: NO GROWTH
Culture: NO GROWTH
Special Requests: ADEQUATE
Special Requests: ADEQUATE

## 2020-06-02 LAB — CBC WITH DIFFERENTIAL/PLATELET
Abs Immature Granulocytes: 0.16 10*3/uL — ABNORMAL HIGH (ref 0.00–0.07)
Basophils Absolute: 0.1 10*3/uL (ref 0.0–0.1)
Basophils Relative: 1 %
Eosinophils Absolute: 0.2 10*3/uL (ref 0.0–0.5)
Eosinophils Relative: 3 %
HCT: 39.7 % (ref 39.0–52.0)
Hemoglobin: 13.8 g/dL (ref 13.0–17.0)
Immature Granulocytes: 2 %
Lymphocytes Relative: 24 %
Lymphs Abs: 2.3 10*3/uL (ref 0.7–4.0)
MCH: 31.8 pg (ref 26.0–34.0)
MCHC: 34.8 g/dL (ref 30.0–36.0)
MCV: 91.5 fL (ref 80.0–100.0)
Monocytes Absolute: 0.9 10*3/uL (ref 0.1–1.0)
Monocytes Relative: 9 %
Neutro Abs: 6 10*3/uL (ref 1.7–7.7)
Neutrophils Relative %: 61 %
Platelets: 246 10*3/uL (ref 150–400)
RBC: 4.34 MIL/uL (ref 4.22–5.81)
RDW: 11.9 % (ref 11.5–15.5)
WBC: 9.6 10*3/uL (ref 4.0–10.5)
nRBC: 0 % (ref 0.0–0.2)

## 2020-06-02 LAB — GLUCOSE, CAPILLARY
Glucose-Capillary: 126 mg/dL — ABNORMAL HIGH (ref 70–99)
Glucose-Capillary: 228 mg/dL — ABNORMAL HIGH (ref 70–99)
Glucose-Capillary: 71 mg/dL (ref 70–99)
Glucose-Capillary: 156 mg/dL — ABNORMAL HIGH (ref 70–99)
Glucose-Capillary: 125 mg/dL — ABNORMAL HIGH (ref 70–99)
Glucose-Capillary: 112 mg/dL — ABNORMAL HIGH (ref 70–99)
Glucose-Capillary: 106 mg/dL — ABNORMAL HIGH (ref 70–99)

## 2020-06-02 LAB — HEPARIN LEVEL (UNFRACTIONATED): Heparin Unfractionated: 0.28 IU/mL — ABNORMAL LOW (ref 0.30–0.70)

## 2020-06-02 MED ORDER — APIXABAN 5 MG PO TABS: 5.0000 mg | ORAL_TABLET | Freq: Two times a day (BID) | ORAL | Status: DC

## 2020-06-02 MED ORDER — APIXABAN 5 MG PO TABS
10.0000 mg | ORAL_TABLET | Freq: Two times a day (BID) | ORAL | Status: DC
  Administered 2020-06-02 – 2020-06-04 (×5): 10 mg via ORAL
  Filled 2020-06-02 (×5): qty 2

## 2020-06-02 NOTE — Progress Notes (Signed)
ANTICOAGULATION CONSULT NOTE Pharmacy Consult for heparin Indication: pulmonary embolus  No Known Allergies  Patient Measurements: Height: 5\' 9"  (175.3 cm) Weight: 73.3 kg (161 lb 9.6 oz) IBW/kg (Calculated) : 70.7 Heparin Dosing Weight: 73.6  Vital Signs: Temp: 98.1 F (36.7 C) (06/22 2236) Temp Source: Oral (06/22 2236) BP: 156/71 (06/22 2236) Pulse Rate: 82 (06/22 2236)  Labs: Recent Labs    05/31/20 0313 05/31/20 0313 05/31/20 0856 05/31/20 1629 06/01/20 0303 06/02/20 0252  HGB 12.4*   < >  --   --  13.3 13.8  HCT 35.4*  --   --   --  37.3* 39.7  PLT 178  --   --   --  203 246  HEPARINUNFRC 0.40  --    < > 0.41 0.37 0.28*  CREATININE 1.13  --   --   --   --   --    < > = values in this interval not displayed.    Estimated Creatinine Clearance: 68.6 mL/min (by C-G formula based on SCr of 1.13 mg/dL).  Assessment: 64 y.o. male with PE for heparin  Goal of Therapy:  Heparin level 0.3-0.7 units/ml Monitor platelets by anticoagulation protocol: Yes   Plan:  Increase Heparin 1450 units/hr  Phillis Knack, PharmD, BCPS

## 2020-06-02 NOTE — Consult Note (Signed)
[facility]    Date of Admission:  05/27/2020           Day 2 ceftriaxone        Day 2 metronidazole       Reason for Consult: Liver abscess    Referring Provider: Dr. Niel Hummer  Assessment: He has a small liver abscess complicating diverticulitis.  I agree with continuing empiric ceftriaxone and metronidazole pending final culture results.  Plan: 1. Continue current antibiotics  Principal Problem:   Liver abscess Active Problems:   Septic shock (HCC)   Pulmonary embolus (HCC)   Diverticulitis   Renal insufficiency   Diabetes ([place])   History of gout   Atherosclerotic peripheral vascular disease (HCC)   DVT (deep vein thrombosis) in pregnancy   Scheduled Meds: . Chlorhexidine Gluconate Cloth  6 each Topical Daily  . insulin aspart  0-15 Units Subcutaneous Q4H   Continuous Infusions: . sodium chloride Stopped (05/29/20 0525)  . cefTRIAXone (ROCEPHIN)  IV Stopped (06/01/20 1900)  . heparin 1,450 Units/hr (06/02/20 0514)  . lactated ringers 75 mL/hr at 06/01/20 2154  . metronidazole 500 mg (06/02/20 0519)   PRN Meds:.docusate sodium, polyethylene glycol, traMADol  HPI: Fernando Rhodes is a 64 y.o. male who was admitted recently after developing swelling and pain in his left calf associated with fever and syncope.  He was found to have a left leg DVT and pulmonary embolus.  Abdominal CT also showed the incidental finding of diverticulitis and a liver mass.  He was initially started on broad empiric antibiotic therapy but this was stopped after 3 days when he became afebrile and blood cultures were negative.  Yesterday the liver mass was aspirated yielding 12 cc of pus.  The abscess cavity was completely collapsed so no drain was placed.  No organisms are seen on Gram stain.  Cultures are pending.  He says that he is feeling better.   Review of Systems: Review of Systems  Constitutional: Negative for chills, diaphoresis,  fever, malaise/fatigue and weight loss.  Respiratory: Negative for cough and shortness of breath.   Cardiovascular: Negative for chest pain.  Gastrointestinal: Negative for abdominal pain, diarrhea, nausea and vomiting.  Genitourinary: Negative for dysuria.  Musculoskeletal:       Recent pain and swelling in his left calf.  Skin: Negative for rash.  Neurological: Negative for headaches.    Past Medical History:  Diagnosis Date  . Diabetes mellitus without complication ([place])   . Hypertension   . Renal insufficiency 06/01/2020    Social History   Tobacco Use  . Smoking status: Never Smoker  . Smokeless tobacco: Never Used  Substance Use Topics  . Alcohol use: No  . Drug use: No    History reviewed. No pertinent family history. No Known Allergies  OBJECTIVE: Blood pressure 140/78, pulse 90, temperature 98.1 F (36.7 C), temperature source Oral, resp. rate 16, height 5\' 9"  (1.753 m), weight 73.3 kg, SpO2 95 %.  Physical Exam Constitutional:      Comments: He answers questions with simple one-word answers.  Cardiovascular:     Rate and Rhythm: Normal rate and regular rhythm.     Heart sounds: No murmur heard.   Pulmonary:     Effort: Pulmonary effort is normal.     Breath sounds: Normal breath sounds.  Abdominal:     Palpations: Abdomen is soft.     Tenderness: There is no abdominal tenderness.  Musculoskeletal:  Comments: He has some diffuse swelling and tenderness of his left calf.  Skin:    Findings: No rash.  Neurological:     General: No focal deficit present.  Psychiatric:        Mood and Affect: Mood normal.     Lab Results Lab Results  Component Value Date   WBC 9.6 06/02/2020   HGB 13.8 06/02/2020   HCT 39.7 06/02/2020   MCV 91.5 06/02/2020   PLT 246 06/02/2020    Lab Results  Component Value Date   CREATININE 1.13 05/31/2020   BUN 14 05/31/2020   NA 136 05/31/2020   K 3.6 05/31/2020   CL 103 05/31/2020   CO2 22 05/31/2020    Lab  Results  Component Value Date   ALT 49 (H) 05/28/2020   AST 43 (H) 05/28/2020   ALKPHOS 88 05/28/2020   BILITOT 1.2 05/28/2020     Microbiology: Recent Results (from the past 240 hour(s))  Blood culture (routine x 2)     Status: None   Collection Time: 05/27/20 11:00 PM   Specimen: BLOOD LEFT HAND  Result Value Ref Range Status   Specimen Description BLOOD LEFT HAND  Final   Special Requests   Final    BOTTLES DRAWN AEROBIC AND ANAEROBIC Blood Culture adequate volume   Culture   Final    NO GROWTH 5 DAYS Performed at [facility], [address] [place], [place] [address]    Report Status 06/02/2020 FINAL  Final  Blood culture (routine x 2)     Status: None   Collection Time: 05/27/20 11:05 PM   Specimen: BLOOD RIGHT HAND  Result Value Ref Range Status   Specimen Description BLOOD RIGHT HAND  Final   Special Requests   Final    BOTTLES DRAWN AEROBIC AND ANAEROBIC Blood Culture adequate volume   Culture   Final    NO GROWTH 5 DAYS Performed at [facility], [place] [address] [place],  [address]    Report Status 06/02/2020 FINAL  Final  SARS Coronavirus 2 by RT PCR (hospital order, performed in [facility] hospital lab) Nasopharyngeal Nasopharyngeal Swab     Status: None   Collection Time: 05/27/20 11:11 PM   Specimen: Nasopharyngeal Swab  Result Value Ref Range Status   SARS Coronavirus 2 NEGATIVE NEGATIVE Final    Comment: (NOTE) SARS-CoV-2 target nucleic acids are NOT DETECTED.  The SARS-CoV-2 RNA is generally detectable in upper and lower respiratory specimens during the acute phase of infection. The lowest concentration of SARS-CoV-2 viral copies this assay can detect is 250 copies / mL. A negative result does not preclude SARS-CoV-2 infection and should not be used as the sole basis for treatment or other patient management decisions.  A negative result may occur with improper specimen collection / handling, submission of specimen other than  nasopharyngeal swab, presence of viral mutation(s) within the areas targeted by this assay, and inadequate number of viral copies (<250 copies / mL). A negative result must be combined with clinical observations, patient history, and epidemiological information.  Fact Sheet for Patients:   StrictlyIdeas.no  Fact Sheet for Healthcare Providers: BankingDealers.co.za  This test is not yet approved or  cleared by the [place] FDA and has been authorized for detection and/or diagnosis of SARS-CoV-2 by FDA under an Emergency Use Authorization (EUA).  This EUA will remain in effect (meaning this test can be used) for the duration of the COVID-19 declaration under Section 564(b)(1)  of the Act, 21 U.S.C. section 360bbb-3(b)(1), unless the authorization is terminated or revoked sooner.  Performed at [facility], [place] [address], [place], [place] [address]   Urine culture     Status: None   Collection Time: 05/28/20  4:35 AM   Specimen: Urine, Random  Result Value Ref Range Status   Specimen Description URINE, RANDOM  Final   Special Requests NONE  Final   Culture   Final    NO GROWTH Performed at [facility], [place] [address], [place], [place] [address]    Report Status 05/29/2020 FINAL  Final  MRSA PCR Screening     Status: None   Collection Time: 05/28/20  3:00 PM   Specimen: Nasal Mucosa; Nasopharyngeal  Result Value Ref Range Status   MRSA by PCR NEGATIVE NEGATIVE Final    Comment:        The GeneXpert MRSA Assay (FDA approved for NASAL specimens only), is one component of a comprehensive MRSA colonization surveillance program. It is not intended to diagnose MRSA infection nor to guide or monitor treatment for MRSA infections. Performed at [facility], [place] [address], [place], [place] [address]   Aerobic/Anaerobic Culture (surgical/deep wound)     Status: None (Preliminary result)   Collection Time:  06/01/20  3:10 PM   Specimen: Abscess  Result Value Ref Range Status   Specimen Description ABSCESS LIVER  Final   Special Requests Normal  Final   Gram Stain   Final    FEW WBC PRESENT, PREDOMINANTLY PMN NO ORGANISMS SEEN Performed at [facility], [place] [address], [place], [place] [address]    Culture PENDING  Incomplete   Report Status PENDING  Incomplete    Michel Bickers, MD [facility] [place] [facility] [phone] pager   [phone] cell 06/02/2020, 8:25 AM

## 2020-06-02 NOTE — Progress Notes (Signed)
PROGRESS NOTE  Fernando Rhodes UJW:[id number] DOB: 1956-02-27   PCP: Antony Blackbird, MD  Patient is from: Home  DOA: 05/27/2020 LOS: 5  Brief Narrative / Interim history: 64 year old male with history of DM-2 HTN and gout presenting with malaise, weakness and weakness syncopal episode while urinating brought to ED by EMS.  On arrival to ED, he was hemodynamically stable.  WBC 18.  LA 4.  Creatinine 1.7.  He became hypotensive and remained hypotensive despite IV fluid boluses and admitted to ICU for septic shock.  CT abdomen revealed sigmoid diverticulitis, heterogeneous peripherally enhancing lesion within the right liver lobe measuring 2.3 by 2.0 cm, and large diffuse low density seen throughout the liver parenchyma.  He was started on broad-spectrum antibiotics.  The next day (6/19), CTA chest revealed acute submassive saddle PE with right heart strain.  Lower extremity Doppler showed LLE DVT.   Patient had MRI liver on 6/20 that showed abscess.  IR consulted and drain the abscess on 6/22.  ID consulted.  Subjective: Seen and examined earlier this morning.  No major events overnight of this morning.  No complaints.  Denies chest pain, dyspnea, GI or UTI symptoms.  Objective: Vitals:   06/01/20 1516 06/01/20 1637 06/01/20 2236 06/02/20 0750  BP: 117/74 130/78 (!) 156/71 140/78  Pulse: 67 66 82 90  Resp: 16 16  16   Temp: 98.7 F (37.1 C) 97.7 F (36.5 C) 98.1 F (36.7 C)   TempSrc: Oral Oral Oral   SpO2: 96% 96% 92% 95%  Weight:      Height:        Intake/Output Summary (Last 24 hours) at 06/02/2020 1350 Last data filed at 06/02/2020 0714 Gross per 24 hour  Intake 1338.66 ml  Output --  Net 1338.66 ml   Filed Weights   05/30/20 0500 05/31/20 0500 06/01/20 0500  Weight: 74.6 kg 75 kg 73.3 kg    Examination:  GENERAL: No apparent distress.  Nontoxic. HEENT: MMM.  Vision and hearing grossly intact.  NECK: Supple.  No apparent JVD.  RESP:  No IWOB.  Fair aeration  bilaterally. CVS:  RRR. Heart sounds normal.  ABD/GI/GU: BS+. Abd soft, NTND.  MSK/EXT:  Moves extremities. No apparent deformity. No edema.  SKIN: Dressing over RUQ DCI. NEURO: Awake, alert and oriented appropriately.  No apparent focal neuro deficit. PSYCH: Calm. Normal affect.  Procedures:  6/22-CT-guided right hepatic dome abscess aspiration by IR  Microbiology summarized: 6/17-COVID-19 PCR negative. 6/17-urine culture NGTD. 6/17-blood cultures negative. 6/18-MRSA PCR negative. 6/22-liver abscess culture NGTD.  Assessment & Plan: Submassive saddle PE/LLE DVT-Echo with normal RV function. -Has been on heparin drip.  Will transition to [place]  Septic shock due to liver abscess and diverticulitis-blood, urine and liver abscess cultures NGTD. -Sepsis physiology resolved. -s/p needle aspiration by IR on 6/22 -ID following-continue IV ceftriaxone and IV Flagyl   Acute sigmoid Diverticulitis:  -Antibiotics as above  AKI; Cr 1.7 (peak)>> 1.13.  Resolved. -Monitor intermittently  Essential hypertension: Normotensive for most part. -Continue holding lisinopril/HCTZ. -Discontinue LR.  History of gout-no acute flareup.   Body mass index is 23.86 kg/m.         DVT prophylaxis:    Code Status: On heparin drip for PE. Family Communication: Patient and/or RN. Available if any question.  Status is: Inpatient  Remains inpatient appropriate because:Ongoing active pain requiring inpatient pain management, IV treatments appropriate due to intensity of illness or inability to take PO and Inpatient level of care appropriate due to severity  of illness   Dispo: The patient is from: Home              Anticipated d/c is to: Home              Anticipated d/c date is: 2 days              Patient currently is not medically stable to d/c.       Consultants:  ID IR PCCM   Sch Meds:  Scheduled Meds: . Chlorhexidine Gluconate Cloth  6 each Topical Daily  . insulin  aspart  0-15 Units Subcutaneous Q4H   Continuous Infusions: . sodium chloride Stopped (05/29/20 0525)  . cefTRIAXone (ROCEPHIN)  IV 2 g (06/02/20 0848)  . heparin 1,550 Units/hr (06/02/20 0842)  . lactated ringers 75 mL/hr at 06/01/20 2154  . metronidazole 500 mg (06/02/20 0519)   PRN Meds:.docusate sodium, polyethylene glycol, traMADol  Antimicrobials: Anti-infectives (From admission, onward)   Start     Dose/Rate Route Frequency Ordered Stop   06/01/20 1530  cefTRIAXone (ROCEPHIN) 2 g in sodium chloride 0.9 % 100 mL IVPB     Discontinue     2 g 200 mL/hr over 30 Minutes Intravenous Every 24 hours 06/01/20 1522     06/01/20 1515  cefTRIAXone (ROCEPHIN) 1 g in sodium chloride 0.9 % 100 mL IVPB  Status:  Discontinued        1 g 200 mL/hr over 30 Minutes Intravenous Every 24 hours 06/01/20 1510 06/01/20 1521   06/01/20 1400  metroNIDAZOLE (FLAGYL) IVPB 500 mg     Discontinue     500 mg 100 mL/hr over 60 Minutes Intravenous Every 8 hours 06/01/20 1313 06/06/20 1359   05/29/20 0730  cefTRIAXone (ROCEPHIN) 2 g in sodium chloride 0.9 % 100 mL IVPB  Status:  Discontinued        2 g 200 mL/hr over 30 Minutes Intravenous Every 24 hours 05/29/20 0641 05/30/20 0655   05/28/20 1000  vancomycin (VANCOREADY) IVPB 750 mg/150 mL  Status:  Discontinued        750 mg 150 mL/hr over 60 Minutes Intravenous Every 12 hours 05/28/20 0336 05/29/20 0641   05/28/20 1000  ceFEPIme (MAXIPIME) 2 g in sodium chloride 0.9 % 100 mL IVPB  Status:  Discontinued        2 g 200 mL/hr over 30 Minutes Intravenous Every 12 hours 05/28/20 0336 05/29/20 0641   05/28/20 0800  metroNIDAZOLE (FLAGYL) IVPB 500 mg  Status:  Discontinued        500 mg 100 mL/hr over 60 Minutes Intravenous Every 8 hours 05/28/20 0334 05/30/20 0655   05/28/20 0030  vancomycin (VANCOCIN) IVPB 1000 mg/200 mL premix        1,000 mg 200 mL/hr over 60 Minutes Intravenous  Once 05/28/20 0016 05/28/20 0552   05/28/20 0030  ceFEPIme (MAXIPIME) 2 g in  sodium chloride 0.9 % 100 mL IVPB        2 g 200 mL/hr over 30 Minutes Intravenous  Once 05/28/20 0016 05/28/20 0225   05/28/20 0015  metroNIDAZOLE (FLAGYL) IVPB 500 mg        500 mg 100 mL/hr over 60 Minutes Intravenous  Once 05/28/20 0003 05/28/20 0112       I have personally reviewed the following labs and images: CBC: Recent Labs  Lab 05/29/20 0848 05/30/20 0213 05/31/20 0313 06/01/20 0303 06/02/20 0252  WBC 11.9* 9.7 8.7 9.3 9.6  NEUTROABS 9.0* 6.7 5.5 6.0 6.0  HGB  12.4* 12.4* 12.4* 13.3 13.8  HCT 35.8* 35.4* 35.4* 37.3* 39.7  MCV 91.8 91.2 91.5 90.1 91.5  PLT 130* 153 178 203 246   BMP &GFR Recent Labs  Lab 05/27/20 2311 05/28/20 0356 05/29/20 0848 05/31/20 0313  NA 134* 135 135 136  K 3.5 4.2 3.6 3.6  CL 101 102 104 103  CO2 18* 22 22 22   GLUCOSE 273* 213* 184* 125*  BUN 28* 27* 23 14  CREATININE 1.71* 1.60* 1.31* 1.13  CALCIUM 8.5* 8.4* 8.0* 8.0*   Estimated Creatinine Clearance: 68.6 mL/min (by C-G formula based on SCr of 1.13 mg/dL). Liver & Pancreas: Recent Labs  Lab 05/27/20 2311 05/28/20 0356  AST 35 43*  ALT 44 49*  ALKPHOS 98 88  BILITOT 1.5* 1.2  PROT 6.3* 5.9*  ALBUMIN 3.1* 2.7*   Recent Labs  Lab 05/27/20 2311  LIPASE 26   No results for input(s): AMMONIA in the last 168 hours. Diabetic: No results for input(s): HGBA1C in the last 72 hours. Recent Labs  Lab 06/01/20 1936 06/01/20 2319 06/02/20 0323 06/02/20 0839 06/02/20 1215  GLUCAP 165* 113* 126* 228* 71   Cardiac Enzymes: Recent Labs  Lab 05/27/20 2311  CKTOTAL 152   No results for input(s): PROBNP in the last 8760 hours. Coagulation Profile: No results for input(s): INR, PROTIME in the last 168 hours. Thyroid Function Tests: No results for input(s): TSH, T4TOTAL, FREET4, T3FREE, THYROIDAB in the last 72 hours. Lipid Profile: No results for input(s): CHOL, HDL, LDLCALC, TRIG, CHOLHDL, LDLDIRECT in the last 72 hours. Anemia Panel: No results for input(s):  VITAMINB12, FOLATE, FERRITIN, TIBC, IRON, RETICCTPCT in the last 72 hours. Urine analysis:    Component Value Date/Time   COLORURINE YELLOW 05/28/2020 0435   APPEARANCEUR CLEAR 05/28/2020 0435   LABSPEC 1.023 05/28/2020 0435   PHURINE 5.0 05/28/2020 0435   GLUCOSEU 50 (A) 05/28/2020 0435   HGBUR LARGE (A) 05/28/2020 [place] 05/28/2020 0435   KETONESUR NEGATIVE 05/28/2020 0435   PROTEINUR NEGATIVE 05/28/2020 0435   NITRITE NEGATIVE 05/28/2020 0435   LEUKOCYTESUR NEGATIVE 05/28/2020 0435   Sepsis Labs: Invalid input(s): PROCALCITONIN, [place]  Microbiology: Recent Results (from the past 240 hour(s))  Blood culture (routine x 2)     Status: None   Collection Time: 05/27/20 11:00 PM   Specimen: BLOOD LEFT HAND  Result Value Ref Range Status   Specimen Description BLOOD LEFT HAND  Final   Special Requests   Final    BOTTLES DRAWN AEROBIC AND ANAEROBIC Blood Culture adequate volume   Culture   Final    NO GROWTH 5 DAYS Performed at [facility], [address], [place], [place] [address]    Report Status 06/02/2020 FINAL  Final  Blood culture (routine x 2)     Status: None   Collection Time: 05/27/20 11:05 PM   Specimen: BLOOD RIGHT HAND  Result Value Ref Range Status   Specimen Description BLOOD RIGHT HAND  Final   Special Requests   Final    BOTTLES DRAWN AEROBIC AND ANAEROBIC Blood Culture adequate volume   Culture   Final    NO GROWTH 5 DAYS Performed at [facility], [place] [address], [place], [place] [address]    Report Status 06/02/2020 FINAL  Final  SARS Coronavirus 2 by RT PCR (hospital order, performed in [facility] hospital lab) Nasopharyngeal Nasopharyngeal Swab     Status: None   Collection Time: 05/27/20 11:11 PM  Specimen: Nasopharyngeal Swab  Result Value Ref Range Status   SARS Coronavirus 2 NEGATIVE NEGATIVE Final    Comment: (NOTE) SARS-CoV-2 target nucleic acids are NOT DETECTED.  The SARS-CoV-2 RNA is generally  detectable in upper and lower respiratory specimens during the acute phase of infection. The lowest concentration of SARS-CoV-2 viral copies this assay can detect is 250 copies / mL. A negative result does not preclude SARS-CoV-2 infection and should not be used as the sole basis for treatment or other patient management decisions.  A negative result may occur with improper specimen collection / handling, submission of specimen other than nasopharyngeal swab, presence of viral mutation(s) within the areas targeted by this assay, and inadequate number of viral copies (<250 copies / mL). A negative result must be combined with clinical observations, patient history, and epidemiological information.  Fact Sheet for Patients:   StrictlyIdeas.no  Fact Sheet for Healthcare Providers: BankingDealers.co.za  This test is not yet approved or  cleared by the [place] FDA and has been authorized for detection and/or diagnosis of SARS-CoV-2 by FDA under an Emergency Use Authorization (EUA).  This EUA will remain in effect (meaning this test can be used) for the duration of the COVID-19 declaration under Section 564(b)(1) of the Act, 21 U.S.C. section 360bbb-3(b)(1), unless the authorization is terminated or revoked sooner.  Performed at [facility], [place] [address], [place], [place] [address]   Urine culture     Status: None   Collection Time: 05/28/20  4:35 AM   Specimen: Urine, Random  Result Value Ref Range Status   Specimen Description URINE, RANDOM  Final   Special Requests NONE  Final   Culture   Final    NO GROWTH Performed at [facility], [place] [address], [place], [place] [address]    Report Status 05/29/2020 FINAL  Final  MRSA PCR Screening     Status: None   Collection Time: 05/28/20  3:00 PM   Specimen: Nasal Mucosa; Nasopharyngeal  Result Value Ref Range Status   MRSA by PCR NEGATIVE NEGATIVE Final    Comment:         The GeneXpert MRSA Assay (FDA approved for NASAL specimens only), is one component of a comprehensive MRSA colonization surveillance program. It is not intended to diagnose MRSA infection nor to guide or monitor treatment for MRSA infections. Performed at [facility], [place] [address], [place], [place] [address]   Aerobic/Anaerobic Culture (surgical/deep wound)     Status: None (Preliminary result)   Collection Time: 06/01/20  3:10 PM   Specimen: Abscess  Result Value Ref Range Status   Specimen Description ABSCESS LIVER  Final   Special Requests Normal  Final   Gram Stain   Final    FEW WBC PRESENT, PREDOMINANTLY PMN NO ORGANISMS SEEN    Culture   Final    NO GROWTH < 24 HOURS Performed at [facility], [place] [address], [place], [place] [address]    Report Status PENDING  Incomplete    Radiology Studies: IR US Guide Bx Asp/Drain  Result Date: 06/01/2020 INDICATION: POSTERIOR RIGHT HEPATIC DOME LESION CONCERNING FOR ABSCESS EXAM: ULTRASOUND RIGHT HEPATIC DOME ABSCESS ASPIRATION MEDICATIONS: The patient is currently admitted to the hospital and receiving intravenous antibiotics. The antibiotics were administered within an appropriate time frame prior to the initiation of the procedure. ANESTHESIA/SEDATION: Fentanyl 75 mcg IV; Versed 1.5 mg IV Moderate Sedation Time:  13 MINUTES The patient was continuously monitored  during the procedure by the interventional radiology nurse under my direct supervision. COMPLICATIONS: None immediate. PROCEDURE: Informed written consent was obtained from the patient after a thorough discussion of the procedural risks, benefits and alternatives. All questions were addressed. Maximal Sterile Barrier Technique was utilized including caps, mask, sterile gowns, sterile gloves, sterile drape, hand hygiene and skin antiseptic. A timeout was performed prior to the initiation of the procedure. previous imaging reviewed. pulmonary ultrasound  performed. the right hepatic dome cystic lesion was localized and marked with ultrasound in the mid axillary line through a lower intercostal space. under sterile conditions and local anesthesia, an 18 gauge 15 cm access needle was advanced into the right hepatic dome cystic lesion. needle position confirmed with ultrasound. images obtained for documentation. syringe aspiration yielded 12 cc thick bloody exudative fluid compatible with abscess. This completely collapsed the cavity. Needle removed. Postprocedure imaging demonstrates no hemorrhage or hematoma. Patient tolerated the aspiration well. IMPRESSION: Successful CT-guided right hepatic dome abscess aspiration only. Sample sent for cytology and culture. Electronically Signed   By: Jerilynn Mages.  Shick M.D.   On: 06/01/2020 15:17   VAS [place] LOWER EXTREMITY VENOUS (DVT)  Result Date: 06/01/2020  Lower Venous DVTStudy Indications: Pulmonary embolism.  Performing Technologist: Antonieta Pert RDMS, RVT Supporting Technologist: Darlin Coco  Examination Guidelines: A complete evaluation includes B-mode imaging, spectral Doppler, color Doppler, and power Doppler as needed of all accessible portions of each vessel. Bilateral testing is considered an integral part of a complete examination. Limited examinations for reoccurring indications may be performed as noted. The reflux portion of the exam is performed with the patient in reverse Trendelenburg.  +---------+---------------+---------+-----------+----------+--------------+ RIGHT    CompressibilityPhasicitySpontaneityPropertiesThrombus Aging +---------+---------------+---------+-----------+----------+--------------+ CFV      Full           Yes      Yes                                 +---------+---------------+---------+-----------+----------+--------------+ SFJ      Full                                                        +---------+---------------+---------+-----------+----------+--------------+ FV  Prox  Full                                                        +---------+---------------+---------+-----------+----------+--------------+ FV Mid   Full                                                        +---------+---------------+---------+-----------+----------+--------------+ FV DistalFull                                                        +---------+---------------+---------+-----------+----------+--------------+ PFV      Full                                                        +---------+---------------+---------+-----------+----------+--------------+  POP      Full           Yes      Yes                                 +---------+---------------+---------+-----------+----------+--------------+ PTV      Full                                                        +---------+---------------+---------+-----------+----------+--------------+ PERO     Full                                                        +---------+---------------+---------+-----------+----------+--------------+ GSV      Partial                                                     +---------+---------------+---------+-----------+----------+--------------+   +---------+---------------+---------+-----------+----------+--------------+ LEFT     CompressibilityPhasicitySpontaneityPropertiesThrombus Aging +---------+---------------+---------+-----------+----------+--------------+ CFV      Full           Yes      Yes                                 +---------+---------------+---------+-----------+----------+--------------+ SFJ      Full                                                        +---------+---------------+---------+-----------+----------+--------------+ FV Prox  None                                         Acute          +---------+---------------+---------+-----------+----------+--------------+ FV Mid   None                                          Acute          +---------+---------------+---------+-----------+----------+--------------+ FV DistalNone                                         Acute          +---------+---------------+---------+-----------+----------+--------------+ PFV      Full                                         Acute          +---------+---------------+---------+-----------+----------+--------------+ POP  None           No       No                   Acute          +---------+---------------+---------+-----------+----------+--------------+ PTV      None                                         Acute          +---------+---------------+---------+-----------+----------+--------------+ PERO     None                                         Acute          +---------+---------------+---------+-----------+----------+--------------+ Gastroc  None                                         Acute          +---------+---------------+---------+-----------+----------+--------------+ GSV      Full                                                        +---------+---------------+---------+-----------+----------+--------------+     Summary: RIGHT: - There is no evidence of deep vein thrombosis in the lower extremity.  - No cystic structure found in the popliteal fossa.  LEFT: - Findings consistent with acute deep vein thrombosis involving the left femoral vein, left popliteal vein, left posterior tibial veins, left peroneal veins, and left gastrocnemius veins. - No cystic structure found in the popliteal fossa.  *See table(s) above for measurements and observations. Electronically signed by Servando Snare MD on 06/01/2020 at 5:13:35 PM.    Final      Raelee Rossmann T. [place]  If 7PM-7AM, please contact night-coverage [email or web address] Password [email or web address] 06/02/2020, 1:50 PM

## 2020-06-02 NOTE — Progress Notes (Addendum)
ANTICOAGULATION CONSULT NOTE  Pharmacy Consult for heparin Indication: pulmonary embolus  No Known Allergies  Patient Measurements: Height: 5\' 9"  (175.3 cm) Weight: 73.3 kg (161 lb 9.6 oz) IBW/kg (Calculated) : 70.7 Heparin Dosing Weight: 73.6  Vital Signs: Temp: 98.1 F (36.7 C) (06/22 2236) Temp Source: Oral (06/22 2236) BP: 140/78 (06/23 0750) Pulse Rate: 90 (06/23 0750)  Labs: Recent Labs    05/31/20 0313 05/31/20 0313 05/31/20 0856 05/31/20 1629 06/01/20 0303 06/02/20 0252  HGB 12.4*   < >  --   --  13.3 13.8  HCT 35.4*  --   --   --  37.3* 39.7  PLT 178  --   --   --  203 246  HEPARINUNFRC 0.40  --    < > 0.41 0.37 0.28*  CREATININE 1.13  --   --   --   --   --    < > = values in this interval not displayed.    Estimated Creatinine Clearance: 68.6 mL/min (by C-G formula based on SCr of 1.13 mg/dL).  Assessment: Patient originally presenting with shock thought to be potentially septic is now with newly found saddle PE on CTA. Pharmacy has been asked to initiate heparin drip.  Hep lvl 0.28 - slightly low  Goal of Therapy:  Heparin level 0.3-0.7 units/ml Monitor platelets by anticoagulation protocol: Yes   Plan:  Increase heparin to 1550 units/hr Recheck at [place], PharmD, BCPS, BCCCP Clinical Pharmacist [phone]  Please check AMION for all [place] numbers  06/02/2020 8:07 AM  2:18 PM ADDENDUM:  Asked to change to apixaban  Plan: DC heparin  Give apixaban 1st dose at time of dc of above apixaban 10 mg BID x 1 week then 5 mg BID

## 2020-06-03 DIAGNOSIS — E1165 Type 2 diabetes mellitus with hyperglycemia: Secondary | ICD-10-CM

## 2020-06-03 DIAGNOSIS — N179 Acute kidney failure, unspecified: Secondary | ICD-10-CM

## 2020-06-03 LAB — GLUCOSE, CAPILLARY
Glucose-Capillary: 108 mg/dL — ABNORMAL HIGH (ref 70–99)
Glucose-Capillary: 221 mg/dL — ABNORMAL HIGH (ref 70–99)
Glucose-Capillary: 117 mg/dL — ABNORMAL HIGH (ref 70–99)
Glucose-Capillary: 134 mg/dL — ABNORMAL HIGH (ref 70–99)
Glucose-Capillary: 109 mg/dL — ABNORMAL HIGH (ref 70–99)
Glucose-Capillary: 108 mg/dL — ABNORMAL HIGH (ref 70–99)

## 2020-06-03 LAB — CBC WITH DIFFERENTIAL/PLATELET
Abs Immature Granulocytes: 0.16 10*3/uL — ABNORMAL HIGH (ref 0.00–0.07)
Basophils Absolute: 0.1 10*3/uL (ref 0.0–0.1)
Basophils Relative: 1 %
Eosinophils Absolute: 0.4 10*3/uL (ref 0.0–0.5)
Eosinophils Relative: 5 %
HCT: 38 % — ABNORMAL LOW (ref 39.0–52.0)
Hemoglobin: 13.4 g/dL (ref 13.0–17.0)
Immature Granulocytes: 2 %
Lymphocytes Relative: 22 %
Lymphs Abs: 1.9 10*3/uL (ref 0.7–4.0)
MCH: 32.2 pg (ref 26.0–34.0)
MCHC: 35.3 g/dL (ref 30.0–36.0)
MCV: 91.3 fL (ref 80.0–100.0)
Monocytes Absolute: 0.7 10*3/uL (ref 0.1–1.0)
Monocytes Relative: 8 %
Neutro Abs: 5.5 10*3/uL (ref 1.7–7.7)
Neutrophils Relative %: 62 %
Platelets: 248 10*3/uL (ref 150–400)
RBC: 4.16 MIL/uL — ABNORMAL LOW (ref 4.22–5.81)
RDW: 12.3 % (ref 11.5–15.5)
WBC: 8.8 10*3/uL (ref 4.0–10.5)
nRBC: 0 % (ref 0.0–0.2)

## 2020-06-03 LAB — CYTOLOGY - NON PAP

## 2020-06-03 MED ORDER — ATORVASTATIN CALCIUM 10 MG PO TABS
20.0000 mg | ORAL_TABLET | Freq: Every day | ORAL | Status: DC
  Administered 2020-06-03 – 2020-06-04 (×2): 20 mg via ORAL
  Filled 2020-06-03 (×2): qty 2

## 2020-06-03 NOTE — Progress Notes (Signed)
PROGRESS NOTE  Fernando Rhodes HEN:[id number] DOB: 12/03/1956   PCP: Antony Blackbird, MD  Patient is from: Home  DOA: 05/27/2020 LOS: 6  Brief Narrative / Interim history: 64 year old male with history of DM-2 HTN and gout presenting with malaise, weakness and weakness syncopal episode while urinating brought to ED by EMS.  On arrival to ED, he was hemodynamically stable.  WBC 18.  LA 4.  Creatinine 1.7.  He became hypotensive and remained hypotensive despite IV fluid boluses and admitted to ICU for septic shock.  CT abdomen revealed sigmoid diverticulitis, heterogeneous peripherally enhancing lesion within the right liver lobe measuring 2.3 by 2.0 cm, and large diffuse low density seen throughout the liver parenchyma.  He was started on broad-spectrum antibiotics.  The next day (6/19), CTA chest revealed acute submassive saddle PE with right heart strain.  Lower extremity Doppler showed LLE DVT.   Patient had MRI liver on 6/20 that showed abscess.  IR consulted and drained the abscess on 6/22.  Abscess culture negative.  On ceftriaxone and IV Flagyl.  ID following.  Subjective: Seen and examined earlier this morning.  No major events overnight of this morning.  No complaints.  Denies chest pain, dyspnea, GI or UTI symptoms.  Asking when he would be able to go home.  Objective: Vitals:   06/02/20 0750 06/02/20 1644 06/02/20 2317 06/03/20 0800  BP: 140/78 136/74 119/69 122/74  Pulse: 90 77 65 71  Resp: 16 17 15    Temp:  98 F (36.7 C) 98.1 F (36.7 C)   TempSrc:   Oral   SpO2: 95% 95% 95%   Weight:      Height:       No intake or output data in the 24 hours ending 06/03/20 1446 Filed Weights   05/30/20 0500 05/31/20 0500 06/01/20 0500  Weight: 74.6 kg 75 kg 73.3 kg    Examination:  GENERAL: No apparent distress.  Nontoxic. HEENT: MMM.  Vision and hearing grossly intact.  NECK: Supple.  No apparent JVD.  RESP:  No IWOB.  Fair aeration bilaterally. CVS:  RRR. Heart  sounds normal.  ABD/GI/GU: BS+. Abd soft, NTND.  MSK/EXT:  Moves extremities. No apparent deformity. No edema.  SKIN: no apparent skin lesion or wound NEURO: Awake, alert and oriented fairly.  No apparent focal neuro deficit. PSYCH: Calm. Normal affect.  Procedures:  6/22-CT-guided right hepatic dome abscess aspiration by IR  Microbiology summarized: 6/17-COVID-19 PCR negative. 6/17-urine culture NGTD. 6/17-blood cultures negative. 6/18-MRSA PCR negative. 6/22-liver abscess culture NGTD.  Assessment & Plan: Submassive saddle PE/LLE DVT-Echo with normal RV function. -Now on Eliquis.  Stable.  Septic shock due to liver abscess and diverticulitis-blood, urine and liver abscess cultures NGTD. -Sepsis physiology resolved. -s/p needle aspiration by IR on 6/22.  Abscess culture NGTD. -ID following-continue IV ceftriaxone and IV Flagyl.  Will clarify duration with ID.  Acute sigmoid Diverticulitis:  -Antibiotics as above  Controlled type 2 diabetes with hyperglycemia: A1c 7.0%.  New diagnosis.  Not on medication at home. Recent Labs    06/03/20 0430 06/03/20 0800 06/03/20 1232  GLUCAP 108* 221* 117*  -Continue moderate SSI -Start moderate intensity statin  AKI: Cr 1.7 (peak)>> 1.13.  Resolved. -Monitor intermittently  Essential hypertension: Normotensive for most part. -Continue holding lisinopril/HCTZ. -Discontinue LR.  History of gout-no acute flareup.   Body mass index is 23.86 kg/m.         DVT prophylaxis:   apixaban (ELIQUIS) tablet 10 mg  apixaban (ELIQUIS) tablet 5  mg  Code Status: Full code Family Communication: Patient and/or RN. Available if any question.  Status is: Inpatient  Remains inpatient appropriate because:IV treatments appropriate due to intensity of illness or inability to take PO and Inpatient level of care appropriate due to severity of illness   Dispo: The patient is from: Home              Anticipated d/c is to: Home               Anticipated d/c date is: 2 days              Patient currently is not medically stable to d/c.       Consultants:  ID IR PCCM   Sch Meds:  Scheduled Meds: . apixaban  10 mg Oral BID   Followed by  . [START ON 06/09/2020] apixaban  5 mg Oral BID  . Chlorhexidine Gluconate Cloth  6 each Topical Daily  . insulin aspart  0-15 Units Subcutaneous Q4H   Continuous Infusions: . sodium chloride Stopped (05/29/20 0525)  . cefTRIAXone (ROCEPHIN)  IV 2 g (06/03/20 0858)  . metronidazole 500 mg (06/03/20 1419)   PRN Meds:.docusate sodium, polyethylene glycol, traMADol  Antimicrobials: Anti-infectives (From admission, onward)   Start     Dose/Rate Route Frequency Ordered Stop   06/01/20 1530  cefTRIAXone (ROCEPHIN) 2 g in sodium chloride 0.9 % 100 mL IVPB     Discontinue     2 g 200 mL/hr over 30 Minutes Intravenous Every 24 hours 06/01/20 1522     06/01/20 1515  cefTRIAXone (ROCEPHIN) 1 g in sodium chloride 0.9 % 100 mL IVPB  Status:  Discontinued        1 g 200 mL/hr over 30 Minutes Intravenous Every 24 hours 06/01/20 1510 06/01/20 1521   06/01/20 1400  metroNIDAZOLE (FLAGYL) IVPB 500 mg     Discontinue     500 mg 100 mL/hr over 60 Minutes Intravenous Every 8 hours 06/01/20 1313 06/06/20 1359   05/29/20 0730  cefTRIAXone (ROCEPHIN) 2 g in sodium chloride 0.9 % 100 mL IVPB  Status:  Discontinued        2 g 200 mL/hr over 30 Minutes Intravenous Every 24 hours 05/29/20 0641 05/30/20 0655   05/28/20 1000  vancomycin (VANCOREADY) IVPB 750 mg/150 mL  Status:  Discontinued        750 mg 150 mL/hr over 60 Minutes Intravenous Every 12 hours 05/28/20 0336 05/29/20 0641   05/28/20 1000  ceFEPIme (MAXIPIME) 2 g in sodium chloride 0.9 % 100 mL IVPB  Status:  Discontinued        2 g 200 mL/hr over 30 Minutes Intravenous Every 12 hours 05/28/20 0336 05/29/20 0641   05/28/20 0800  metroNIDAZOLE (FLAGYL) IVPB 500 mg  Status:  Discontinued        500 mg 100 mL/hr over 60 Minutes Intravenous  Every 8 hours 05/28/20 0334 05/30/20 0655   05/28/20 0030  vancomycin (VANCOCIN) IVPB 1000 mg/200 mL premix        1,000 mg 200 mL/hr over 60 Minutes Intravenous  Once 05/28/20 0016 05/28/20 0552   05/28/20 0030  ceFEPIme (MAXIPIME) 2 g in sodium chloride 0.9 % 100 mL IVPB        2 g 200 mL/hr over 30 Minutes Intravenous  Once 05/28/20 0016 05/28/20 0225   05/28/20 0015  metroNIDAZOLE (FLAGYL) IVPB 500 mg        500 mg 100 mL/hr over 60 Minutes Intravenous  Once 05/28/20 0003 05/28/20 0112       I have personally reviewed the following labs and images: CBC: Recent Labs  Lab 05/30/20 0213 05/31/20 0313 06/01/20 0303 06/02/20 0252 06/03/20 0334  WBC 9.7 8.7 9.3 9.6 8.8  NEUTROABS 6.7 5.5 6.0 6.0 5.5  HGB 12.4* 12.4* 13.3 13.8 13.4  HCT 35.4* 35.4* 37.3* 39.7 38.0*  MCV 91.2 91.5 90.1 91.5 91.3  PLT 153 178 203 246 248   BMP &GFR Recent Labs  Lab 05/27/20 2311 05/28/20 0356 05/29/20 0848 05/31/20 0313  NA 134* 135 135 136  K 3.5 4.2 3.6 3.6  CL 101 102 104 103  CO2 18* 22 22 22   GLUCOSE 273* 213* 184* 125*  BUN 28* 27* 23 14  CREATININE 1.71* 1.60* 1.31* 1.13  CALCIUM 8.5* 8.4* 8.0* 8.0*   Estimated Creatinine Clearance: 68.6 mL/min (by C-G formula based on SCr of 1.13 mg/dL). Liver & Pancreas: Recent Labs  Lab 05/27/20 2311 05/28/20 0356  AST 35 43*  ALT 44 49*  ALKPHOS 98 88  BILITOT 1.5* 1.2  PROT 6.3* 5.9*  ALBUMIN 3.1* 2.7*   Recent Labs  Lab 05/27/20 2311  LIPASE 26   No results for input(s): AMMONIA in the last 168 hours. Diabetic: No results for input(s): HGBA1C in the last 72 hours. Recent Labs  Lab 06/02/20 2108 06/02/20 2310 06/03/20 0430 06/03/20 0800 06/03/20 1232  GLUCAP 112* 106* 108* 221* 117*   Cardiac Enzymes: Recent Labs  Lab 05/27/20 2311  CKTOTAL 152   No results for input(s): PROBNP in the last 8760 hours. Coagulation Profile: No results for input(s): INR, PROTIME in the last 168 hours. Thyroid Function Tests: No  results for input(s): TSH, T4TOTAL, FREET4, T3FREE, THYROIDAB in the last 72 hours. Lipid Profile: No results for input(s): CHOL, HDL, LDLCALC, TRIG, CHOLHDL, LDLDIRECT in the last 72 hours. Anemia Panel: No results for input(s): VITAMINB12, FOLATE, FERRITIN, TIBC, IRON, RETICCTPCT in the last 72 hours. Urine analysis:    Component Value Date/Time   COLORURINE YELLOW 05/28/2020 0435   APPEARANCEUR CLEAR 05/28/2020 0435   LABSPEC 1.023 05/28/2020 0435   PHURINE 5.0 05/28/2020 0435   GLUCOSEU 50 (A) 05/28/2020 0435   HGBUR LARGE (A) 05/28/2020 [place] 05/28/2020 0435   KETONESUR NEGATIVE 05/28/2020 0435   PROTEINUR NEGATIVE 05/28/2020 0435   NITRITE NEGATIVE 05/28/2020 0435   LEUKOCYTESUR NEGATIVE 05/28/2020 0435   Sepsis Labs: Invalid input(s): PROCALCITONIN, [place]  Microbiology: Recent Results (from the past 240 hour(s))  Blood culture (routine x 2)     Status: None   Collection Time: 05/27/20 11:00 PM   Specimen: BLOOD LEFT HAND  Result Value Ref Range Status   Specimen Description BLOOD LEFT HAND  Final   Special Requests   Final    BOTTLES DRAWN AEROBIC AND ANAEROBIC Blood Culture adequate volume   Culture   Final    NO GROWTH 5 DAYS Performed at [facility], [address] [place], [place] [address]    Report Status 06/02/2020 FINAL  Final  Blood culture (routine x 2)     Status: None   Collection Time: 05/27/20 11:05 PM   Specimen: BLOOD RIGHT HAND  Result Value Ref Range Status   Specimen Description BLOOD RIGHT HAND  Final   Special Requests   Final    BOTTLES DRAWN AEROBIC AND ANAEROBIC Blood Culture adequate volume   Culture   Final    NO GROWTH 5 DAYS Performed at [facility], [place] [address] [place], [place] [address]    Report Status 06/02/2020 FINAL  Final  SARS Coronavirus 2 by RT PCR (hospital order, performed in [facility] hospital lab) Nasopharyngeal Nasopharyngeal Swab     Status: None   Collection Time:  05/27/20 11:11 PM   Specimen: Nasopharyngeal Swab  Result Value Ref Range Status   SARS Coronavirus 2 NEGATIVE NEGATIVE Final    Comment: (NOTE) SARS-CoV-2 target nucleic acids are NOT DETECTED.  The SARS-CoV-2 RNA is generally detectable in upper and lower respiratory specimens during the acute phase of infection. The lowest concentration of SARS-CoV-2 viral copies this assay can detect is 250 copies / mL. A negative result does not preclude SARS-CoV-2 infection and should not be used as the sole basis for treatment or other patient management decisions.  A negative result may occur with improper specimen collection / handling, submission of specimen other than nasopharyngeal swab, presence of viral mutation(s) within the areas targeted by this assay, and inadequate number of viral copies (<250 copies / mL). A negative result must be combined with clinical observations, patient history, and epidemiological information.  Fact Sheet for Patients:   StrictlyIdeas.no  Fact Sheet for Healthcare Providers: BankingDealers.co.za  This test is not yet approved or  cleared by the [place] FDA and has been authorized for detection and/or diagnosis of SARS-CoV-2 by FDA under an Emergency Use Authorization (EUA).  This EUA will remain in effect (meaning this test can be used) for the duration of the COVID-19 declaration under Section 564(b)(1) of the Act, 21 U.S.C. section 360bbb-3(b)(1), unless the authorization is terminated or revoked sooner.  Performed at [facility], [place] [address] [place], [place] [address]   Urine culture     Status: None   Collection Time: 05/28/20  4:35 AM   Specimen: Urine, Random  Result Value Ref Range Status   Specimen Description URINE, RANDOM  Final   Special Requests NONE  Final   Culture   Final    NO GROWTH Performed at [facility], [place] [address] [place], [place] [address]     Report Status 05/29/2020 FINAL  Final  MRSA PCR Screening     Status: None   Collection Time: 05/28/20  3:00 PM   Specimen: Nasal Mucosa; Nasopharyngeal  Result Value Ref Range Status   MRSA by PCR NEGATIVE NEGATIVE Final    Comment:        The GeneXpert MRSA Assay (FDA approved for NASAL specimens only), is one component of a comprehensive MRSA colonization surveillance program. It is not intended to diagnose MRSA infection nor to guide or monitor treatment for MRSA infections. Performed at [facility], [place] [address], [place], [place] [address]   Aerobic/Anaerobic Culture (surgical/deep wound)     Status: None (Preliminary result)   Collection Time: 06/01/20  3:10 PM   Specimen: Abscess  Result Value Ref Range Status   Specimen Description ABSCESS LIVER  Final   Special Requests Normal  Final   Gram Stain   Final    FEW WBC PRESENT, PREDOMINANTLY PMN NO ORGANISMS SEEN    Culture   Final    NO GROWTH 2 DAYS Performed at [facility], [place] [address] [place], [place] [address]    Report Status PENDING  Incomplete    Radiology Studies: No results found.   Croy Drumwright T. [place]  If 7PM-7AM, please contact night-coverage [email or web address] Password [facility] 06/03/2020, 2:46 PM

## 2020-06-03 NOTE — Progress Notes (Signed)
Patient ID: Fernando Rhodes, male   DOB: [birth year], 64 y.o.   MRN: [id number]          [facility]    Date of Admission:  05/27/2020   Total days of antibiotics 3  Blood and liver abscess cultures are negative to date.  If they remain negative overnight I will change him to oral levofloxacin and metronidazole (assuming he does not drink alcohol or agrees to abstain).            Michel Bickers, MD [facility] [place] [facility] [phone] pager   [phone] cell 06/03/2020, 3:15 PM

## 2020-06-04 DIAGNOSIS — I82402 Acute embolism and thrombosis of unspecified deep veins of left lower extremity: Secondary | ICD-10-CM

## 2020-06-04 DIAGNOSIS — I2609 Other pulmonary embolism with acute cor pulmonale: Secondary | ICD-10-CM

## 2020-06-04 DIAGNOSIS — E1169 Type 2 diabetes mellitus with other specified complication: Secondary | ICD-10-CM

## 2020-06-04 LAB — COMPREHENSIVE METABOLIC PANEL
ALT: 32 U/L (ref 0–44)
AST: 21 U/L (ref 15–41)
Albumin: 2.6 g/dL — ABNORMAL LOW (ref 3.5–5.0)
Alkaline Phosphatase: 100 U/L (ref 38–126)
Anion gap: 11 (ref 5–15)
BUN: 13 mg/dL (ref 8–23)
CO2: 23 mmol/L (ref 22–32)
Calcium: 8.6 mg/dL — ABNORMAL LOW (ref 8.9–10.3)
Chloride: 101 mmol/L (ref 98–111)
Creatinine, Ser: 1.07 mg/dL (ref 0.61–1.24)
GFR calc Af Amer: >60 mL/min (ref >60)
GFR calc non Af Amer: >60 mL/min (ref >60)
Glucose, Bld: 118 mg/dL — ABNORMAL HIGH (ref 70–99)
Potassium: 3.7 mmol/L (ref 3.5–5.1)
Sodium: 135 mmol/L (ref 135–145)
Total Bilirubin: 0.6 mg/dL (ref 0.3–1.2)
Total Protein: 6.2 g/dL — ABNORMAL LOW (ref 6.5–8.1)

## 2020-06-04 LAB — CBC WITH DIFFERENTIAL/PLATELET
Abs Immature Granulocytes: 0.15 10*3/uL — ABNORMAL HIGH (ref 0.00–0.07)
Basophils Absolute: 0.1 10*3/uL (ref 0.0–0.1)
Basophils Relative: 1 %
Eosinophils Absolute: 0.4 10*3/uL (ref 0.0–0.5)
Eosinophils Relative: 5 %
HCT: 38.9 % — ABNORMAL LOW (ref 39.0–52.0)
Hemoglobin: 13.4 g/dL (ref 13.0–17.0)
Immature Granulocytes: 2 %
Lymphocytes Relative: 23 %
Lymphs Abs: 1.9 10*3/uL (ref 0.7–4.0)
MCH: 31.8 pg (ref 26.0–34.0)
MCHC: 34.4 g/dL (ref 30.0–36.0)
MCV: 92.2 fL (ref 80.0–100.0)
Monocytes Absolute: 0.6 10*3/uL (ref 0.1–1.0)
Monocytes Relative: 7 %
Neutro Abs: 5.3 10*3/uL (ref 1.7–7.7)
Neutrophils Relative %: 62 %
Platelets: 272 10*3/uL (ref 150–400)
RBC: 4.22 MIL/uL (ref 4.22–5.81)
RDW: 12.5 % (ref 11.5–15.5)
WBC: 8.5 10*3/uL (ref 4.0–10.5)
nRBC: 0 % (ref 0.0–0.2)

## 2020-06-04 LAB — GLUCOSE, CAPILLARY
Glucose-Capillary: 118 mg/dL — ABNORMAL HIGH (ref 70–99)
Glucose-Capillary: 118 mg/dL — ABNORMAL HIGH (ref 70–99)
Glucose-Capillary: 94 mg/dL (ref 70–99)

## 2020-06-04 LAB — MAGNESIUM: Magnesium: 1.8 mg/dL (ref 1.7–2.4)

## 2020-06-04 MED ORDER — METFORMIN HCL 500 MG PO TABS: 500.0000 mg | ORAL_TABLET | Freq: Two times a day (BID) | ORAL | 1 refills | Status: DC

## 2020-06-04 MED ORDER — APIXABAN (ELIQUIS) VTE STARTER PACK (10MG AND 5MG)
ORAL_TABLET | ORAL | 0 refills | Status: DC
Start: 2020-06-04 — End: 2022-02-13

## 2020-06-04 MED ORDER — METRONIDAZOLE 500 MG PO TABS: 500.0000 mg | ORAL_TABLET | Freq: Three times a day (TID) | ORAL | 0 refills | Status: DC

## 2020-06-04 MED ORDER — CIPROFLOXACIN HCL 750 MG PO TABS: 750.0000 mg | ORAL_TABLET | Freq: Two times a day (BID) | ORAL | 0 refills | Status: DC

## 2020-06-04 MED FILL — metFORMIN HCL 500 MG TABS: 500 | 30 days supply | Qty: 60 | Fill #0

## 2020-06-04 MED FILL — ELIQUIS STARTER PACK 5 MG T: 5 | 30 days supply | Qty: 74 | Fill #0

## 2020-06-04 MED FILL — metroNIDAZOLE 500 MG TABS: 500 | 21 days supply | Qty: 63 | Fill #0

## 2020-06-04 MED FILL — CIPROFLOXACIN HCL 750 MG TA: 750 | 21 days supply | Qty: 42 | Fill #0

## 2020-06-04 NOTE — Discharge Summary (Signed)
Physician Discharge Summary  Fernando Rhodes XAJ:[id number] DOB: 01-17-1956 DOA: 05/27/2020  PCP: Antony Blackbird, MD  Admit date: 05/27/2020 Discharge date: 06/04/2020  Admitted From: Home Disposition: Home  Recommendations for Outpatient Follow-up:  1. Follow ups as below. 2. Please obtain CBC/BMP/Mag at follow up 3. Please follow up on the following pending results: None  Home Health: None required Equipment/Devices: None required  Discharge Condition: Stable CODE STATUS: Full code   Follow-up Information    Fulp, Cammie, MD. Schedule an appointment as soon as possible for a visit in 1 week(s).   Specialty: Family Medicine Contact information: [address] [address] [phone]                Hospital Course: 64 year old male with history of DM-2 HTN and gout presenting with malaise, weakness and weakness syncopal episode while urinating brought to ED by EMS.  On arrival to ED, he was hemodynamically stable.  WBC 18.  LA 4.  Creatinine 1.7.  He became hypotensive and remained hypotensive despite IV fluid boluses and admitted to ICU for septic shock.  CT abdomen revealed sigmoid diverticulitis, heterogeneous peripherally enhancing lesion within the right liver lobe measuring 2.3 by 2.0 cm, and large diffuse low density seen throughout the liver parenchyma.  He was started on broad-spectrum antibiotics.  The next day (6/19), CTA chest revealed acute submassive saddle PE with right heart strain.  Lower extremity Doppler showed LLE DVT.  Treated with IV heparin and transition to Eliquis.  He was discharged on starter pack Eliquis.  Patient had MRI liver on 6/20 that showed abscess.  IR consulted and drained the abscess on 6/22.  Abscess culture NGTD.   Received ceftriaxone and IV Flagyl from 618-6/25 and discharged on p.o. Flagyl and Cipro for 21 days as recommended by infectious disease.  Patient was evaluated by physical therapy and no need was  identified.  Provided with work excuse letter.  See individual problem list below for more hospital course.  Discharge Diagnoses:  Submassive saddle PE/LLE DVT-Echo with normal RV function.  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 -Treated with IV heparin and transition to p.o. Eliquis. -Discharged on starter pack Eliquis.  Septic shock due to liver abscess and diverticulitis-blood, urine and liver abscess cultures NGTD. -Sepsis physiology resolved. -s/p needle aspiration by IR on 6/22.  Abscess culture NGTD. -Treated with IV cefepime and Flagyl then IV ceftriaxone and Flagyl, and discharged on Cipro and Flagyl for 3 more weeks per ID recommendation. -Follow-up with ID on 06/23/2020.  Acute sigmoid Diverticulitis: -Antibiotics as above  Controlled type 2 diabetes: A1c 7.0%.  New diagnosis.  Not on medication at home. Recent Labs    06/04/20 0319 06/04/20 0728 06/04/20 1148  GLUCAP 118* 118* 94  -Discharged on Metformin 500 mg twice daily. -Continue on simvastatin.  AKI:Cr 1.7 (peak)>> 1.07.  Resolved.  Essential hypertension: Normotensive for most part. -Discharged on home lisinopril/HCTZ.  History of gout-no acute flareup. -Discharged on home allopurinol.  Family communication: Patient's wife updated on the plan   Body mass index is 23.86 kg/m.            Discharge Exam: Vitals:   06/03/20 2244 06/04/20 0727  BP: 133/81 120/81  Pulse: 69 70  Resp: 20 19  Temp: 97.7 F (36.5 C) 98 F (36.7 C)  SpO2: 92% 95%    GENERAL: No apparent distress.  Nontoxic. HEENT: MMM.  Vision and hearing grossly intact.  NECK: Supple.  No apparent JVD.  RESP:  No IWOB.  Fair aeration bilaterally. CVS:  RRR. Heart sounds normal.  ABD/GI/GU: Bowel sounds present. Soft. Non tender.  MSK/EXT:  Moves extremities. No apparent deformity. No edema.  SKIN: no apparent skin lesion or wound NEURO: Awake, alert and oriented appropriately.  No apparent focal neuro deficit. PSYCH: Calm.  Normal affect.   Discharge Instructions  Discharge Instructions    Call MD for:  difficulty breathing, headache or visual disturbances   Complete by: As directed    Call MD for:  extreme fatigue   Complete by: As directed    Call MD for:  persistant dizziness or light-headedness   Complete by: As directed    Call MD for:  temperature >100.4   Complete by: As directed    Diet - low sodium heart healthy   Complete by: As directed    Diet Carb Modified   Complete by: As directed    Discharge instructions   Complete by: As directed    It has been a pleasure taking care of you!  You were hospitalized with pulmonary embolism (blood clot in your lungs), deep venous thrombosis (blood clot in the leg) and liver infection.  You have been started on blood thinner for blood clot.  You have been treated with antibiotic for possible liver infection.  We are discharging you on your blood thinner and antibiotics to continue treatment for both conditions. You have been diagnosed with type 2 diabetes as well.  We started you on low-dose Metformin.   We strongly recommend not taking over-the-counter pain medication other than plain Tylenol while taking blood thinners.   We may have made some changes to your home medications during this hospitalization. Please review your new medication list and the directions carefully before you take them.    Please go to your hospital follow-up appointments or call to reschedule as recommended.   Take care,   Increase activity slowly   Complete by: As directed    No wound care   Complete by: As directed      Allergies as of 06/04/2020   No Known Allergies     Medication List    STOP taking these medications   Colchicine 0.6 MG Caps     TAKE these medications   acetaminophen 500 MG tablet Commonly known as: TYLENOL Take 1,000 mg by mouth every 6 (six) hours as needed for headache (pain).   allopurinol 100 MG tablet Commonly known as:  ZYLOPRIM Take 1 tablet (100 mg total) by mouth daily.   Apixaban Starter Pack (10mg  and 5mg ) Commonly known as: ELIQUIS STARTER PACK Take as directed on package: start with two-5mg  tablets twice daily for 7 days. On day 8, switch to one-5mg  tablet twice daily.   CALCIUM-MAGNESIUM-VITAMIN D PO Take 1 tablet by mouth daily.   ciprofloxacin 750 MG tablet Commonly known as: CIPRO Take 1 tablet (750 mg total) by mouth 2 (two) times daily for 21 days.   lisinopril-hydrochlorothiazide 20-12.5 MG tablet Commonly known as: ZESTORETIC TAKE ONE TABLET BY MOUTH EVERY DAY   metFORMIN 500 MG tablet Commonly known as: Glucophage Take 1 tablet (500 mg total) by mouth 2 (two) times daily with a meal.   metroNIDAZOLE 500 MG tablet Commonly known as: FLAGYL Take 1 tablet (500 mg total) by mouth 3 (three) times daily for 21 days.   simvastatin 20 MG tablet Commonly known as: ZOCOR Take 20 mg by mouth at bedtime.   vitamin B-12 1000 MCG tablet Commonly known as: CYANOCOBALAMIN Take  1,000 mcg by mouth daily.   vitamin C 1000 MG tablet Take 1,000 mg by mouth daily.       Consultations:  Infectious disease  Interventional radiology  Pulmonology  Procedures/Studies:  6/22-CT-guided right hepatic dome abscess aspiration by IR   2D echo on 05/31/2020 1. Normal LV function; normal RV size and function.  2. Left ventricular ejection fraction, by estimation, is 60 to 65%. The  left ventricle has normal function. The left ventricle has no regional  wall motion abnormalities. Left ventricular diastolic parameters were  normal.  3. Right ventricular systolic function is normal. The right ventricular  size is normal. Tricuspid regurgitation signal is inadequate for assessing  PA pressure.  4. The mitral valve is normal in structure. Trivial mitral valve  regurgitation. No evidence of mitral stenosis.  5. The aortic valve is tricuspid. Aortic valve regurgitation is not  visualized.  No aortic stenosis is present.  6. Mildly dilated pulmonary artery.  7. The inferior vena cava is normal in size with greater than 50%  respiratory variability, suggesting right atrial pressure of 3 mmHg.    CT ANGIO CHEST PE W OR WO CONTRAST  Result Date: 05/29/2020 CLINICAL DATA:  Lower extremity DVT EXAM: CT ANGIOGRAPHY CHEST WITH CONTRAST TECHNIQUE: Multidetector CT imaging of the chest was performed using the standard protocol during bolus administration of intravenous contrast. Multiplanar CT image reconstructions and MIPs were obtained to evaluate the vascular anatomy. CONTRAST:  38mL OMNIPAQUE IOHEXOL 350 MG/ML SOLN COMPARISON:  None. FINDINGS: Cardiovascular: Heart size normal. No pericardial effusion. RV/LV ratio 0.9. Saddle pulmonary embolus with occlusive partially occlusive central emboli bilaterally, right worse than left. Subsegmental left upper lobe and segmental right lower lobe pulmonary emboli. Delayed opacification of right pulmonary veins. Adequate contrast opacification of the thoracic aorta with no evidence of dissection, aneurysm, or stenosis. There is bovine variant brachiocephalic arch anatomy without proximal stenosis. Mediastinum/Nodes: No mediastinal hematoma. No hilar or mediastinal adenopathy. Lungs/Pleura: Trace left pleural fluid. No pneumothorax. Dependent atelectasis posteriorly in both lower lobes. No pulmonary nodular infarct. Upper Abdomen: Subcentimeter hyperdense focus in the dependent aspect of the nondilated gallbladder suggesting calculus. 3.1 cm low-attenuation lesion in segment 8, as previously noted, nonspecific. No acute abdominal findings. Musculoskeletal: Spondylitic changes in the lower cervical spine. No fracture or worrisome bone lesion. Review of the MIP images confirms the above findings. IMPRESSION: 1. Positive for acute saddle, bilateral central, and segmental PE with CT suggestion of right heart strain (RV/LV Ratio = 0.9) consistent with at least  submassive (intermediate risk) PE. The presence of right heart strain has been associated with an increased risk of morbidity and mortality. Critical Value/emergent results were called by telephone at the time of interpretation on 05/29/2020 at 11:22 am to provider [facility] , who verbally acknowledged these results. Electronically Signed   By: Lucrezia Europe M.D.   On: 05/29/2020 11:24   CT ABDOMEN PELVIS W CONTRAST  Result Date: 05/28/2020 CLINICAL DATA:  Sepsis abdominal pain EXAM: CT ABDOMEN AND PELVIS WITH CONTRAST TECHNIQUE: Multidetector CT imaging of the abdomen and pelvis was performed using the standard protocol following bolus administration of intravenous contrast. CONTRAST:  38mL OMNIPAQUE IOHEXOL 300 MG/ML  SOLN COMPARISON:  None. FINDINGS: Lower chest: The visualized heart size within normal limits. No pericardial fluid/thickening. No hiatal hernia. The visualized portions of the lungs are clear. Hepatobiliary: There is a heterogeneous peripherally enhancing lesion within the right liver lobe measuring 2.3 by 2.0 cm. Large diffuse low density seen throughout  the liver parenchyma.The main portal vein is patent. No evidence of calcified gallstones, gallbladder wall thickening or biliary dilatation. Pancreas: Unremarkable. No pancreatic ductal dilatation or surrounding inflammatory changes. Spleen: Normal in size without focal abnormality. Adrenals/Urinary Tract: Both adrenal glands appear normal. The kidneys and collecting system appear normal without evidence of urinary tract calculus or hydronephrosis. Bladder is unremarkable. Stomach/Bowel: The stomach, small bowel, are normal in appearance. The appendix is unremarkable. There appears to be mild wall thickening within the sigmoid colon with diverticula and mild fat stranding changes. No pericolonic fluid collections or free air seen. Vascular/Lymphatic: There are no enlarged mesenteric, retroperitoneal, or pelvic lymph nodes. Scattered aortic  atherosclerotic calcifications are seen without aneurysmal dilatation. Reproductive: The prostate is unremarkable. Other: No evidence of abdominal wall mass or hernia. Musculoskeletal: No acute or significant osseous findings. 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 IMPRESSION: 1. Mild sigmoid colonic diverticulitis. No pericolonic fluid collections or free air. 2. Heterogeneously enhancing lesion within the right liver lobe which is non-specific could represent a hemangioma versus other solid hepatic lesion. For further evaluation would recommend non emergent MRI. 3.  Aortic Atherosclerosis (ICD10-I70.0). Electronically Signed   By: Prudencio Pair M.D.   On: 05/28/2020 03:17   MR LIVER W WO CONTRAST  Result Date: 05/31/2020 CLINICAL DATA:  Hepatic mass for further characterization. EXAM: MRI ABDOMEN WITHOUT AND WITH CONTRAST TECHNIQUE: Multiplanar multisequence MR imaging of the abdomen was performed both before and after the administration of intravenous contrast. CONTRAST:  54mL GADAVIST GADOBUTROL 1 MMOL/ML IV SOLN COMPARISON:  CT abdomen 05/28/2020 and CT chest from 05/29/2020 FINDINGS: Despite efforts by the technologist and patient, motion artifact is present on today's exam and could not be eliminated. This reduces exam sensitivity and specificity. Lower chest: The patient has known pulmonary embolus visible on postcontrast images such image 61/12. This was also described on yesterday's CT angiography of the chest. Hepatobiliary: The lesion of concern in the right hepatic lobe measures 2.8 by 2.2 by 2.4 cm with primarily high T2 signal and low precontrast T1 signal. There is somewhat internal and enhancing thickening septation. On delayed images the margins suggest a pseudo capsule aside from potential mild thickening of the appearance of this lambdoid internal portion substantial restriction of diffusion on high B value images. In segment 3 of the liver, a 0.6 by 0.3 cm T2 hyperintense lesion does  not enhance and is most compatible with a simple cyst. No significant biliary dilatation.  Gallbladder unremarkable. Pancreas:  Unremarkable Spleen:  Unremarkable Adrenals/Urinary Tract: 7 mm cyst in the right kidney lower pole medially on image 40/12. Other smaller nonenhancing renal lesions favor cysts. Adrenal glands unremarkable. Stomach/Bowel: Unremarkable Vascular/Lymphatic:  Unremarkable Other:  Unremarkable Musculoskeletal: Mild lumbar spondylosis and degenerative disc disease. IMPRESSION: 1. Probable cystic lesion in the dome of the right hepatic lobe with thick enhancing septations and some evidence of septation along its margins on the delayed images. Possibilities include abscess, biliary cystadenoma/cystadenocarcinoma, cystic metastatic disease, or atypical hemangioma. 2. The patient has known pulmonary embolus visible on postcontrast images such image 61/12. This was also described on yesterday's CT angiography of the chest. 3. Small simple cyst in segment 3 of the liver. 4. Mild lumbar spondylosis and degenerative disc disease. 5. Despite efforts by the technologist and patient, motion artifact is present on today's exam and could not be eliminated. This reduces exam sensitivity and specificity. Electronically Signed   By: Van Clines M.D.   On: 05/31/2020 08:41   IR US  Guide Bx Asp/Drain  Result Date: 06/01/2020 INDICATION: POSTERIOR RIGHT HEPATIC DOME LESION CONCERNING FOR ABSCESS EXAM: ULTRASOUND RIGHT HEPATIC DOME ABSCESS ASPIRATION MEDICATIONS: The patient is currently admitted to the hospital and receiving intravenous antibiotics. The antibiotics were administered within an appropriate time frame prior to the initiation of the procedure. ANESTHESIA/SEDATION: Fentanyl 75 mcg IV; Versed 1.5 mg IV Moderate Sedation Time:  13 MINUTES The patient was continuously monitored during the procedure by the interventional radiology nurse under my direct supervision. COMPLICATIONS: None immediate.  PROCEDURE: Informed written consent was obtained from the patient after a thorough discussion of the procedural risks, benefits and alternatives. All questions were addressed. Maximal Sterile Barrier Technique was utilized including caps, mask, sterile gowns, sterile gloves, sterile drape, hand hygiene and skin antiseptic. A timeout was performed prior to the initiation of the procedure. previous imaging reviewed. pulmonary ultrasound performed. the right hepatic dome cystic lesion was localized and marked with ultrasound in the mid axillary line through a lower intercostal space. under sterile conditions and local anesthesia, an 18 gauge 15 cm access needle was advanced into the right hepatic dome cystic lesion. needle position confirmed with ultrasound. images obtained for documentation. syringe aspiration yielded 12 cc thick bloody exudative fluid compatible with abscess. This completely collapsed the cavity. Needle removed. Postprocedure imaging demonstrates no hemorrhage or hematoma. Patient tolerated the aspiration well. IMPRESSION: Successful CT-guided right hepatic dome abscess aspiration only. Sample sent for cytology and culture. Electronically Signed   By: Jerilynn Mages.  Shick M.D.   On: 06/01/2020 15:17   DG Chest Port 1 View  Result Date: 05/27/2020 CLINICAL DATA:  Fever EXAM: PORTABLE CHEST 1 VIEW COMPARISON:  None. FINDINGS: Single frontal view of the chest demonstrates an unremarkable cardiac silhouette. No airspace disease, effusion, or pneumothorax. No acute bony abnormalities. IMPRESSION: 1. No acute intrathoracic process. Electronically Signed   By: Randa Ngo M.D.   On: 05/27/2020 23:43   ECHOCARDIOGRAM COMPLETE  Result Date: 05/31/2020    ECHOCARDIOGRAM REPORT   Patient Name:   Fernando Rhodes Date of Exam: 05/31/2020 Medical Rec #:  [id number]            Height:       69.0 in Accession #:    [id number]           Weight:       165.3 lb Date of Birth:  [birth year]            BSA:           1.906 m Patient Age:    64 years             BP:           126/110 mmHg Patient Gender: M                    HR:           75 bpm. Exam Location:  Inpatient Procedure: 2D Echo, 3D Echo, Color Doppler and Cardiac Doppler Indications:    I26.02 Pulmonary embolus  History:        Patient has no prior history of Echocardiogram examinations.                 Risk Factors:Hypertension and Diabetes. Saddle PE.  Sonographer:    Raquel Sarna Senior RDCS Referring Phys: [id number] [place]  1. Normal LV function; normal RV size and function.  2. Left ventricular ejection fraction, by estimation, is 60 to 65%. The left ventricle has normal  function. The left ventricle has no regional wall motion abnormalities. Left ventricular diastolic parameters were normal.  3. Right ventricular systolic function is normal. The right ventricular size is normal. Tricuspid regurgitation signal is inadequate for assessing PA pressure.  4. The mitral valve is normal in structure. Trivial mitral valve regurgitation. No evidence of mitral stenosis.  5. The aortic valve is tricuspid. Aortic valve regurgitation is not visualized. No aortic stenosis is present.  6. Mildly dilated pulmonary artery.  7. The inferior vena cava is normal in size with greater than 50% respiratory variability, suggesting right atrial pressure of 3 mmHg. FINDINGS  Left Ventricle: Left ventricular ejection fraction, by estimation, is 60 to 65%. The left ventricle has normal function. The left ventricle has no regional wall motion abnormalities. The left ventricular internal cavity size was normal in size. There is  no left ventricular hypertrophy. Left ventricular diastolic parameters were normal. Right Ventricle: The right ventricular size is normal. Right ventricular systolic function is normal. Tricuspid regurgitation signal is inadequate for assessing PA pressure. The tricuspid regurgitant velocity is 2.27 m/s, and with an assumed right atrial  pressure of 3  mmHg, the estimated right ventricular systolic pressure is 25.4 mmHg. Left Atrium: Left atrial size was normal in size. Right Atrium: Right atrial size was normal in size. Pericardium: There is no evidence of pericardial effusion. Mitral Valve: The mitral valve is normal in structure. Normal mobility of the mitral valve leaflets. Trivial mitral valve regurgitation. No evidence of mitral valve stenosis. Tricuspid Valve: The tricuspid valve is normal in structure. Tricuspid valve regurgitation is trivial. No evidence of tricuspid stenosis. Aortic Valve: The aortic valve is tricuspid. Aortic valve regurgitation is not visualized. No aortic stenosis is present. Pulmonic Valve: The pulmonic valve was normal in structure. Pulmonic valve regurgitation is not visualized. No evidence of pulmonic stenosis. Aorta: The aortic root is normal in size and structure. Pulmonary Artery: The pulmonary artery is mildly dilated. Venous: The inferior vena cava is normal in size with greater than 50% respiratory variability, suggesting right atrial pressure of 3 mmHg. IAS/Shunts: No atrial level shunt detected by color flow Doppler. Additional Comments: Normal LV function; normal RV size and function.  LEFT VENTRICLE PLAX 2D LVIDd:         3.90 cm  Diastology LVIDs:         2.70 cm  LV e' medial:   8.16 cm/s LV PW:         0.90 cm  LV E/e' medial: 6.8 LV IVS:        1.20 cm LVOT diam:     2.20 cm LV SV:         74 LV SV Index:   39 LVOT Area:     3.80 cm  RIGHT VENTRICLE RV S prime:     11.90 cm/s TAPSE (M-mode): 2.5 cm LEFT ATRIUM             Index       RIGHT ATRIUM           Index LA diam:        3.00 cm 1.57 cm/m  RA Area:     13.40 cm LA Vol (A2C):   37.3 ml 19.57 ml/m RA Volume:   30.20 ml  15.85 ml/m LA Vol (A4C):   23.2 ml 12.18 ml/m LA Biplane Vol: 29.5 ml 15.48 ml/m  AORTIC VALVE LVOT Vmax:   86.80 cm/s LVOT Vmean:  66.300 cm/s LVOT VTI:    0.194  m  AORTA Ao Root diam: 3.60 cm Ao Asc diam:  3.20 cm MITRAL VALVE                TRICUSPID VALVE MV Area (PHT): 3.72 cm    TR Peak grad:   20.6 mmHg MV Decel Time: 204 msec    TR Vmax:        227.00 cm/s MV E velocity: 55.70 cm/s MV A velocity: 48.00 cm/s  SHUNTS MV E/A ratio:  1.16        Systemic VTI:  0.19 m MV A Prime:    12.8 cm/s   Systemic Diam: 2.20 cm Kirk Ruths MD Electronically signed by Kirk Ruths MD Signature Date/Time: 05/31/2020/2:24:39 PM    Final    XR Foot 2 Views Left  Result Date: 05/25/2020 2 view radiographs of the left foot shows previous talonavicular subtalar and calcaneocuboid fusion with some fibrous union of the talonavicular there is also advanced degenerative changes with the navicular and medial cuneiform.  There is an extremely plantarflexed first ray.  VAS [place] LOWER EXTREMITY VENOUS (DVT)  Result Date: 06/01/2020  Lower Venous DVTStudy Indications: Pulmonary embolism.  Performing Technologist: Antonieta Pert RDMS, RVT Supporting Technologist: Darlin Coco  Examination Guidelines: A complete evaluation includes B-mode imaging, spectral Doppler, color Doppler, and power Doppler as needed of all accessible portions of each vessel. Bilateral testing is considered an integral part of a complete examination. Limited examinations for reoccurring indications may be performed as noted. The reflux portion of the exam is performed with the patient in reverse Trendelenburg.  +---------+---------------+---------+-----------+----------+--------------+ RIGHT    CompressibilityPhasicitySpontaneityPropertiesThrombus Aging +---------+---------------+---------+-----------+----------+--------------+ CFV      Full           Yes      Yes                                 +---------+---------------+---------+-----------+----------+--------------+ SFJ      Full                                                        +---------+---------------+---------+-----------+----------+--------------+ FV Prox  Full                                                         +---------+---------------+---------+-----------+----------+--------------+ FV Mid   Full                                                        +---------+---------------+---------+-----------+----------+--------------+ FV DistalFull                                                        +---------+---------------+---------+-----------+----------+--------------+ PFV      Full                                                        +---------+---------------+---------+-----------+----------+--------------+  POP      Full           Yes      Yes                                 +---------+---------------+---------+-----------+----------+--------------+ PTV      Full                                                        +---------+---------------+---------+-----------+----------+--------------+ PERO     Full                                                        +---------+---------------+---------+-----------+----------+--------------+ GSV      Partial                                                     +---------+---------------+---------+-----------+----------+--------------+   +---------+---------------+---------+-----------+----------+--------------+ LEFT     CompressibilityPhasicitySpontaneityPropertiesThrombus Aging +---------+---------------+---------+-----------+----------+--------------+ CFV      Full           Yes      Yes                                 +---------+---------------+---------+-----------+----------+--------------+ SFJ      Full                                                        +---------+---------------+---------+-----------+----------+--------------+ FV Prox  None                                         Acute          +---------+---------------+---------+-----------+----------+--------------+ FV Mid   None                                         Acute           +---------+---------------+---------+-----------+----------+--------------+ FV DistalNone                                         Acute          +---------+---------------+---------+-----------+----------+--------------+ PFV      Full                                         Acute          +---------+---------------+---------+-----------+----------+--------------+ POP  None           No       No                   Acute          +---------+---------------+---------+-----------+----------+--------------+ PTV      None                                         Acute          +---------+---------------+---------+-----------+----------+--------------+ PERO     None                                         Acute          +---------+---------------+---------+-----------+----------+--------------+ Gastroc  None                                         Acute          +---------+---------------+---------+-----------+----------+--------------+ GSV      Full                                                        +---------+---------------+---------+-----------+----------+--------------+     Summary: RIGHT: - There is no evidence of deep vein thrombosis in the lower extremity.  - No cystic structure found in the popliteal fossa.  LEFT: - Findings consistent with acute deep vein thrombosis involving the left femoral vein, left popliteal vein, left posterior tibial veins, left peroneal veins, and left gastrocnemius veins. - No cystic structure found in the popliteal fossa.  *See table(s) above for measurements and observations. Electronically signed by Servando Snare MD on 06/01/2020 at 5:13:35 PM.    Final    VAS [place] LOWER EXTREMITY VENOUS (DVT)  Result Date: 05/29/2020  Lower Venous DVT Study Indications: Edema.  Risk Factors: Gout. Comparison Study: No prior study on file for comparison Performing Technologist: Sharion Dove RVS  Examination Guidelines: A complete evaluation  includes B-mode imaging, spectral Doppler, color Doppler, and power Doppler as needed of all accessible portions of each vessel. Bilateral testing is considered an integral part of a complete examination. Limited examinations for reoccurring indications may be performed as noted. The reflux portion of the exam is performed with the patient in reverse Trendelenburg.  +-----+---------------+---------+-----------+----------+--------------+ RIGHTCompressibilityPhasicitySpontaneityPropertiesThrombus Aging +-----+---------------+---------+-----------+----------+--------------+ CFV  Full           Yes      Yes                                 +-----+---------------+---------+-----------+----------+--------------+   +---------+---------------+---------+-----------+----------+--------------+ LEFT     CompressibilityPhasicitySpontaneityPropertiesThrombus Aging +---------+---------------+---------+-----------+----------+--------------+ CFV      Full           Yes      Yes                                 +---------+---------------+---------+-----------+----------+--------------+ SFJ  Full                                                        +---------+---------------+---------+-----------+----------+--------------+ FV Prox  None                                         Acute          +---------+---------------+---------+-----------+----------+--------------+ FV Mid   None                                         Acute          +---------+---------------+---------+-----------+----------+--------------+ FV DistalNone                                         Acute          +---------+---------------+---------+-----------+----------+--------------+ PFV      Full                                                        +---------+---------------+---------+-----------+----------+--------------+ POP      None           No       No                   Acute           +---------+---------------+---------+-----------+----------+--------------+ PTV      None                                         Acute          +---------+---------------+---------+-----------+----------+--------------+ PERO     None                                         Acute          +---------+---------------+---------+-----------+----------+--------------+ Gastroc  None                                         Acute          +---------+---------------+---------+-----------+----------+--------------+     Summary: RIGHT: - No evidence of common femoral vein obstruction.  LEFT: - Findings consistent with acute deep vein thrombosis involving the left femoral vein, left popliteal vein, left posterior tibial veins, left peroneal veins, and left gastrocnemius veins. - Ultrasound characteristics of enlarged lymph nodes noted in the groin.  *See table(s) above for measurements and observations. Electronically signed by Servando Snare MD on 05/29/2020 at 11:16:10 AM.    Final         The results of significant diagnostics from this  hospitalization (including imaging, microbiology, ancillary and laboratory) are listed below for reference.     Microbiology: Recent Results (from the past 240 hour(s))  Blood culture (routine x 2)     Status: None   Collection Time: 05/27/20 11:00 PM   Specimen: BLOOD LEFT HAND  Result Value Ref Range Status   Specimen Description BLOOD LEFT HAND  Final   Special Requests   Final    BOTTLES DRAWN AEROBIC AND ANAEROBIC Blood Culture adequate volume   Culture   Final    NO GROWTH 5 DAYS Performed at [facility], [address] [place], [place] [address]    Report Status 06/02/2020 FINAL  Final  Blood culture (routine x 2)     Status: None   Collection Time: 05/27/20 11:05 PM   Specimen: BLOOD RIGHT HAND  Result Value Ref Range Status   Specimen Description BLOOD RIGHT HAND  Final   Special Requests   Final    BOTTLES DRAWN AEROBIC AND  ANAEROBIC Blood Culture adequate volume   Culture   Final    NO GROWTH 5 DAYS Performed at [facility], [place] [address], [place], [place] [address]    Report Status 06/02/2020 FINAL  Final  SARS Coronavirus 2 by RT PCR (hospital order, performed in [facility] hospital lab) Nasopharyngeal Nasopharyngeal Swab     Status: None   Collection Time: 05/27/20 11:11 PM   Specimen: Nasopharyngeal Swab  Result Value Ref Range Status   SARS Coronavirus 2 NEGATIVE NEGATIVE Final    Comment: (NOTE) SARS-CoV-2 target nucleic acids are NOT DETECTED.  The SARS-CoV-2 RNA is generally detectable in upper and lower respiratory specimens during the acute phase of infection. The lowest concentration of SARS-CoV-2 viral copies this assay can detect is 250 copies / mL. A negative result does not preclude SARS-CoV-2 infection and should not be used as the sole basis for treatment or other patient management decisions.  A negative result may occur with improper specimen collection / handling, submission of specimen other than nasopharyngeal swab, presence of viral mutation(s) within the areas targeted by this assay, and inadequate number of viral copies (<250 copies / mL). A negative result must be combined with clinical observations, patient history, and epidemiological information.  Fact Sheet for Patients:   StrictlyIdeas.no  Fact Sheet for Healthcare Providers: BankingDealers.co.za  This test is not yet approved or  cleared by the [place] FDA and has been authorized for detection and/or diagnosis of SARS-CoV-2 by FDA under an Emergency Use Authorization (EUA).  This EUA will remain in effect (meaning this test can be used) for the duration of the COVID-19 declaration under Section 564(b)(1) of the Act, 21 U.S.C. section 360bbb-3(b)(1), unless the authorization is terminated or revoked sooner.  Performed at [facility], [place]  [address], [place], [place] [address]   Urine culture     Status: None   Collection Time: 05/28/20  4:35 AM   Specimen: Urine, Random  Result Value Ref Range Status   Specimen Description URINE, RANDOM  Final   Special Requests NONE  Final   Culture   Final    NO GROWTH Performed at [facility], [place] [address] [place], [place] [address]    Report Status 05/29/2020 FINAL  Final  MRSA PCR Screening     Status: None   Collection Time: 05/28/20  3:00 PM   Specimen: Nasal Mucosa; Nasopharyngeal  Result Value Ref Range Status   MRSA by  PCR NEGATIVE NEGATIVE Final    Comment:        The GeneXpert MRSA Assay (FDA approved for NASAL specimens only), is one component of a comprehensive MRSA colonization surveillance program. It is not intended to diagnose MRSA infection nor to guide or monitor treatment for MRSA infections. Performed at [facility], [place] [address], [place], [place] [address]   Aerobic/Anaerobic Culture (surgical/deep wound)     Status: None (Preliminary result)   Collection Time: 06/01/20  3:10 PM   Specimen: Abscess  Result Value Ref Range Status   Specimen Description ABSCESS LIVER  Final   Special Requests Normal  Final   Gram Stain   Final    FEW WBC PRESENT, PREDOMINANTLY PMN NO ORGANISMS SEEN    Culture   Final    NO GROWTH 2 DAYS Performed at [facility], [place] [address], [place], [place] [address]    Report Status PENDING  Incomplete     Labs: BNP (last 3 results) No results for input(s): BNP in the last 8760 hours. Basic Metabolic Panel: Recent Labs  Lab 05/29/20 0848 05/31/20 0313 06/04/20 0422  NA 135 136 135  K 3.6 3.6 3.7  CL 104 103 101  CO2 22 22 23   GLUCOSE 184* 125* 118*  BUN 23 14 13   CREATININE 1.31* 1.13 1.07  CALCIUM 8.0* 8.0* 8.6*  MG  --   --  1.8   Liver Function Tests: Recent Labs  Lab 06/04/20 0422  AST 21  ALT 32  ALKPHOS 100  BILITOT 0.6  PROT 6.2*  ALBUMIN 2.6*   No results for input(s):  LIPASE, AMYLASE in the last 168 hours. No results for input(s): AMMONIA in the last 168 hours. CBC: Recent Labs  Lab 05/31/20 0313 06/01/20 0303 06/02/20 0252 06/03/20 0334 06/04/20 0422  WBC 8.7 9.3 9.6 8.8 8.5  NEUTROABS 5.5 6.0 6.0 5.5 5.3  HGB 12.4* 13.3 13.8 13.4 13.4  HCT 35.4* 37.3* 39.7 38.0* 38.9*  MCV 91.5 90.1 91.5 91.3 92.2  PLT 178 203 246 248 272   Cardiac Enzymes: No results for input(s): CKTOTAL, CKMB, CKMBINDEX, TROPONINI in the last 168 hours. BNP: Invalid input(s): POCBNP CBG: Recent Labs  Lab 06/03/20 1630 06/03/20 1931 06/03/20 2325 06/04/20 0319 06/04/20 0728  GLUCAP 134* 109* 108* 118* 118*   D-Dimer No results for input(s): DDIMER in the last 72 hours. Hgb A1c No results for input(s): HGBA1C in the last 72 hours. Lipid Profile No results for input(s): CHOL, HDL, LDLCALC, TRIG, CHOLHDL, LDLDIRECT in the last 72 hours. Thyroid function studies No results for input(s): TSH, T4TOTAL, T3FREE, THYROIDAB in the last 72 hours.  Invalid input(s): FREET3 Anemia work up No results for input(s): VITAMINB12, FOLATE, FERRITIN, TIBC, IRON, RETICCTPCT in the last 72 hours. Urinalysis    Component Value Date/Time   COLORURINE YELLOW 05/28/2020 0435   APPEARANCEUR CLEAR 05/28/2020 0435   LABSPEC 1.023 05/28/2020 0435   PHURINE 5.0 05/28/2020 0435   GLUCOSEU 50 (A) 05/28/2020 0435   HGBUR LARGE (A) 05/28/2020 0435   BILIRUBINUR NEGATIVE 05/28/2020 0435   KETONESUR NEGATIVE 05/28/2020 0435   PROTEINUR NEGATIVE 05/28/2020 0435   NITRITE NEGATIVE 05/28/2020 0435   LEUKOCYTESUR NEGATIVE 05/28/2020 0435   Sepsis Labs Invalid input(s): PROCALCITONIN,  WBC,  LACTICIDVEN   Time coordinating discharge: 35 minutes  SIGNED:  Mercy Riding, MD  [facility] 06/04/2020, 10:51 AM  If 7PM-7AM, please contact night-coverage [email or web address] Password [email or web address]

## 2020-06-04 NOTE — Evaluation (Signed)
Physical Therapy Evaluation Patient Details Name: Fernando Rhodes MRN: [id number] DOB: 22-Oct-1956 Today's Date: 06/04/2020   History of Present Illness  Pt is 64 yo male with hx of DM2, HTN, and gout.  He presented to ED with weakness and syncopal episode.  Pt was found to have liver abscess that was drained on 6/22.  Additionally, found to have submassive saddle PE with R heart strain and LLE DVT - on Eliquis and stable.  Clinical Impression  Pt admitted with above diagnosis. Pt was able to ambulate, perform stairs and transfers safely.  He scored a 22/24 on the DGI indicating low fall risk.  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  Pt is at baseline.  No further PT indicated.     Follow Up Recommendations No PT follow up    Equipment Recommendations  None recommended by PT    Recommendations for Other Services       Precautions / Restrictions Precautions Precautions: None Restrictions Weight Bearing Restrictions: No      Mobility  Bed Mobility Overal bed mobility: Needs Assistance Bed Mobility: Supine to Sit;Sit to Supine     Supine to sit: Supervision Sit to supine: Supervision      Transfers Overall transfer level: Needs assistance Equipment used: None Transfers: Sit to/from Stand Sit to Stand: Supervision            Ambulation/Gait Ambulation/Gait assistance: Supervision Gait Distance (Feet): 500 Feet Assistive device: None Gait Pattern/deviations: Step-through pattern Gait velocity: normal   General Gait Details: Does have decreased dorsiflexion on L and scuffs foot with walking but this is baseline.  Stairs Stairs: Yes Stairs assistance: Supervision Stair Management: One rail Right;Alternating pattern Number of Stairs: 5    Wheelchair Mobility    Modified Rankin (Stroke Patients Only)       Balance Overall balance assessment: Needs assistance   Sitting balance-Leahy Scale: Normal     Standing  balance support: No upper extremity supported;During functional activity Standing balance-Leahy Scale: Normal                   Standardized Balance Assessment Standardized Balance Assessment : Dynamic Gait Index   Dynamic Gait Index Level Surface: Normal Change in Gait Speed: Normal Gait with Horizontal Head Turns: Mild Impairment Gait with Vertical Head Turns: Normal Gait and Pivot Turn: Normal Step Over Obstacle: Normal Step Around Obstacles: Normal Steps: Mild Impairment Total Score: 22       Pertinent Vitals/Pain Pain Assessment: No/denies pain    Home Living Family/patient expects to be discharged to:: Private residence Living Arrangements: Spouse/significant other Available Help at Discharge: Family;Available PRN/intermittently Type of Home: House Home Access: Stairs to enter Entrance Stairs-Rails: None (no rail but has banister) Technical brewer of Steps: 5 Home Layout: One level Home Equipment: None      Prior Function Level of Independence: Independent         Comments: Pt completely independent.  Works at a [employer], community.  Pt does have congential deficits on L side and with speech.     Hand Dominance        Extremity/Trunk Assessment   Upper Extremity Assessment Upper Extremity Assessment: LUE deficits/detail LUE Deficits / Details: Pt with congential deficits but WFL strength and ROM.    Lower Extremity Assessment Lower Extremity Assessment: LLE deficits/detail LLE Deficits / Details: Pt with congential deficits but functional.  Does have decreased dorsiflexion and scuffs foot with walking - baseline    Cervical /  Trunk Assessment Cervical / Trunk Assessment: Normal  Communication   Communication: Expressive difficulties (chronic-congential)  Cognition Arousal/Alertness: Awake/alert Behavior During Therapy: WFL for tasks assessed/performed Overall Cognitive Status: Within Functional Limits for tasks assessed                                  General Comments: Pt did require repetition and rewording with MMT commadns.      General Comments General comments (skin integrity, edema, etc.): VSS    Exercises     Assessment/Plan    PT Assessment Patent does not need any further PT services  PT Problem List         PT Treatment Interventions      PT Goals (Current goals can be found in the Care Plan section)  Acute Rehab PT Goals Patient Stated Goal: return home PT Goal Formulation: All assessment and education complete, DC therapy Potential to Achieve Goals: Good    Frequency     Barriers to discharge        Co-evaluation               AM-PAC PT "6 Clicks" Mobility  Outcome Measure Help needed turning from your back to your side while in a flat bed without using bedrails?: None Help needed moving from lying on your back to sitting on the side of a flat bed without using bedrails?: None Help needed moving to and from a bed to a chair (including a wheelchair)?: None Help needed standing up from a chair using your arms (e.g., wheelchair or bedside chair)?: None Help needed to walk in hospital room?: None Help needed climbing 3-5 steps with a railing? : None 6 Click Score: 24    End of Session Equipment Utilized During Treatment: Gait belt Activity Tolerance: Patient tolerated treatment well Patient left: in bed;with call bell/phone within reach;with bed alarm set Nurse Communication: Mobility status      Time: 1010-1030 PT Time Calculation (min) (ACUTE ONLY): 20 min   Charges:   PT Evaluation $PT Eval Low Complexity: 1 Low          Momen Ham, PT Acute Rehab Services Pager [phone] [facility] [phone]    Karlton Lemon 06/04/2020, 10:45 AM

## 2020-06-04 NOTE — Plan of Care (Signed)
  Problem: Health Behavior/Discharge Planning: Goal: Ability to manage health-related needs will improve Outcome: Progressing   

## 2020-06-04 NOTE — Progress Notes (Addendum)
Patient ID: Fernando Rhodes, male   DOB: [birth year], 64 y.o.   MRN: [id number]         [facility]  Date of Admission:  05/27/2020           Day 4 ceftriaxone        Day 4 metronidazole ASSESSMENT: He is improving on empiric therapy for sigmoid diverticulitis and a small liver abscess. I recommend discharge home on oral ciprofloxacin and metronidazole (he does not drink alcohol (.  PLAN: 1. Discharge home on oral ciprofloxacin 750 mg twice daily and metronidazole 500 mg three times daily for 3 weeks 2. Follow-up with me on 06/23/2020 3. I will sign off now  Principal Problem:   Liver abscess Active Problems:   Septic shock ([place])   Pulmonary embolus (HCC)   Diverticulitis   Renal insufficiency   Diabetes ([place])   History of gout   Atherosclerotic peripheral vascular disease (HCC)   DVT (deep vein thrombosis) in pregnancy   Scheduled Meds: . apixaban  10 mg Oral BID   Followed by  . [START ON 06/09/2020] apixaban  5 mg Oral BID  . atorvastatin  20 mg Oral Daily  . Chlorhexidine Gluconate Cloth  6 each Topical Daily  . insulin aspart  0-15 Units Subcutaneous Q4H   Continuous Infusions: . sodium chloride Stopped (05/29/20 0525)  . cefTRIAXone (ROCEPHIN)  IV 2 g (06/04/20 0915)  . metronidazole 500 mg (06/04/20 0529)   PRN Meds:.docusate sodium, polyethylene glycol, traMADol   SUBJECTIVE: He says that he is feeling well and back to normal.  Review of Systems: Review of Systems  Constitutional: Negative for chills, diaphoresis and fever.  Gastrointestinal: Negative for abdominal pain, diarrhea, nausea and vomiting.    No Known Allergies  OBJECTIVE: Vitals:   06/03/20 0800 06/03/20 1628 06/03/20 2244 06/04/20 0727  BP: 122/74 136/74 133/81 120/81  Pulse: 71 81 69 70  Resp:   20 19  Temp:   97.7 F (36.5 C) 98 F (36.7 C)  TempSrc:   Oral Oral  SpO2:   92% 95%  Weight:      Height:       Body mass index is 23.86  kg/m.  Physical Exam Constitutional:      Comments: He is resting quietly in bed.  Abdominal:     Palpations: Abdomen is soft. There is no mass.     Tenderness: There is no abdominal tenderness.  Psychiatric:        Mood and Affect: Mood normal.     Lab Results Lab Results  Component Value Date   WBC 8.5 06/04/2020   HGB 13.4 06/04/2020   HCT 38.9 (L) 06/04/2020   MCV 92.2 06/04/2020   PLT 272 06/04/2020    Lab Results  Component Value Date   CREATININE 1.07 06/04/2020   BUN 13 06/04/2020   NA 135 06/04/2020   K 3.7 06/04/2020   CL 101 06/04/2020   CO2 23 06/04/2020    Lab Results  Component Value Date   ALT 32 06/04/2020   AST 21 06/04/2020   ALKPHOS 100 06/04/2020   BILITOT 0.6 06/04/2020     Microbiology: Recent Results (from the past 240 hour(s))  Blood culture (routine x 2)     Status: None   Collection Time: 05/27/20 11:00 PM   Specimen: BLOOD LEFT HAND  Result Value Ref Range Status   Specimen Description BLOOD LEFT HAND  Final   Special Requests   Final  BOTTLES DRAWN AEROBIC AND ANAEROBIC Blood Culture adequate volume   Culture   Final    NO GROWTH 5 DAYS Performed at [facility], [place] [address] [place], [place] [address]    Report Status 06/02/2020 FINAL  Final  Blood culture (routine x 2)     Status: None   Collection Time: 05/27/20 11:05 PM   Specimen: BLOOD RIGHT HAND  Result Value Ref Range Status   Specimen Description BLOOD RIGHT HAND  Final   Special Requests   Final    BOTTLES DRAWN AEROBIC AND ANAEROBIC Blood Culture adequate volume   Culture   Final    NO GROWTH 5 DAYS Performed at [facility], [place] [address] [place], [place] [address]    Report Status 06/02/2020 FINAL  Final  SARS Coronavirus 2 by RT PCR (hospital order, performed in [facility] hospital lab) Nasopharyngeal Nasopharyngeal Swab     Status: None   Collection Time: 05/27/20 11:11 PM   Specimen: Nasopharyngeal Swab  Result Value Ref Range Status    SARS Coronavirus 2 NEGATIVE NEGATIVE Final    Comment: (NOTE) SARS-CoV-2 target nucleic acids are NOT DETECTED.  The SARS-CoV-2 RNA is generally detectable in upper and lower respiratory specimens during the acute phase of infection. The lowest concentration of SARS-CoV-2 viral copies this assay can detect is 250 copies / mL. A negative result does not preclude SARS-CoV-2 infection and should not be used as the sole basis for treatment or other patient management decisions.  A negative result may occur with improper specimen collection / handling, submission of specimen other than nasopharyngeal swab, presence of viral mutation(s) within the areas targeted by this assay, and inadequate number of viral copies (<250 copies / mL). A negative result must be combined with clinical observations, patient history, and epidemiological information.  Fact Sheet for Patients:   StrictlyIdeas.no  Fact Sheet for Healthcare Providers: BankingDealers.co.za  This test is not yet approved or  cleared by the [place] FDA and has been authorized for detection and/or diagnosis of SARS-CoV-2 by FDA under an Emergency Use Authorization (EUA).  This EUA will remain in effect (meaning this test can be used) for the duration of the COVID-19 declaration under Section 564(b)(1) of the Act, 21 U.S.C. section 360bbb-3(b)(1), unless the authorization is terminated or revoked sooner.  Performed at [facility], [place] [address] [place], [place] [address]   Urine culture     Status: None   Collection Time: 05/28/20  4:35 AM   Specimen: Urine, Random  Result Value Ref Range Status   Specimen Description URINE, RANDOM  Final   Special Requests NONE  Final   Culture   Final    NO GROWTH Performed at [facility], [place] [address], [place], [place] [address]    Report Status 05/29/2020 FINAL  Final  MRSA PCR Screening     Status: None    Collection Time: 05/28/20  3:00 PM   Specimen: Nasal Mucosa; Nasopharyngeal  Result Value Ref Range Status   MRSA by PCR NEGATIVE NEGATIVE Final    Comment:        The GeneXpert MRSA Assay (FDA approved for NASAL specimens only), is one component of a comprehensive MRSA colonization surveillance program. It is not intended to diagnose MRSA infection nor to guide or monitor treatment for MRSA infections. Performed at [facility], [place] [address], [place], [place] [address]   Aerobic/Anaerobic Culture (surgical/deep wound)  Status: None (Preliminary result)   Collection Time: 06/01/20  3:10 PM   Specimen: Abscess  Result Value Ref Range Status   Specimen Description ABSCESS LIVER  Final   Special Requests Normal  Final   Gram Stain   Final    FEW WBC PRESENT, PREDOMINANTLY PMN NO ORGANISMS SEEN    Culture   Final    NO GROWTH 2 DAYS Performed at [facility], [address] [place],  [address]    Report Status PENDING  Incomplete    Michel Bickers, MD [facility] [phone] pager   [phone] cell 06/04/2020, 10:02 AM

## 2020-06-04 NOTE — Discharge Instructions (Signed)
Type 2 Diabetes Mellitus, Diagnosis, Adult Type 2 diabetes (type 2 diabetes mellitus) is a long-term (chronic) disease. It may be caused by one or both of these problems:  Your pancreas does not make enough of a hormone called insulin.  Your body does not react in a normal way to insulin that it makes. Insulin lets sugars (glucose) go into cells in your body. This gives you energy. If you have type 2 diabetes, sugars cannot get into cells. This causes high blood sugar (hyperglycemia). Your doctor will set treatment goals for you. Generally, you should have these blood sugar levels:  Before meals (preprandial): 80-130 mg/dL (4.4-7.2 mmol/L).  After meals (postprandial): below 180 mg/dL (10 mmol/L).  A1c (hemoglobin A1c) level: less than 7%. Follow these instructions at home: Questions to ask your doctor  You may want to ask these questions: ? Do I need to meet with a diabetes educator? ? Where can I find a support group for people with diabetes? ? What equipment will I need to care for myself at home? ? What diabetes medicines do I need? When should I take them? ? How often do I need to check my blood sugar? ? What number can I call if I have questions? ? When is my next doctor's visit? General instructions  Take over-the-counter and prescription medicines only as told by your doctor.  Keep all follow-up visits as told by your doctor. This is important. Contact a doctor if:  Your blood sugar is at or above 240 mg/dL (13.3 mmol/L) for 2 days in a row.  You have been sick for 2 days or more, and you are not getting better.  You have had a fever for 2 days or more, and you are not getting better.  You have any of these problems for more than 6 hours: ? You cannot eat or drink. ? You feel sick to your stomach (nauseous). ? You throw up (vomit). ? You have watery poop (diarrhea). Get help right away if:  Your blood sugar is lower than 54 mg/dL (3 mmol/L).  You get  confused.  You have trouble: ? Thinking clearly. ? Breathing.  You have moderate or large ketone levels in your pee (urine). Summary  Type 2 diabetes is a long-term (chronic) disease. Your pancreas may not make enough of a hormone called insulin, or your body may not react normally to insulin that it makes.  Take over-the-counter and prescription medicines only as told by your doctor.  Keep all follow-up visits as told by your doctor. This is important. This information is not intended to replace advice given to you by your health care provider. Make sure you discuss any questions you have with your health care provider. Document Revised: 01/25/2018 Document Reviewed: 12/31/2015 Elsevier Patient Education  2020 [place].  Apixaban oral tablets What is this medicine? APIXABAN (a PIX a ban) is an anticoagulant (blood thinner). It is used to lower the chance of stroke in people with a medical condition called atrial fibrillation. It is also used to treat or prevent blood clots in the lungs or in the veins. This medicine may be used for other purposes; ask your health care provider or pharmacist if you have questions. COMMON BRAND NAME(S): Eliquis What should I tell my health care provider before I take this medicine? They need to know if you have any of these conditions:  antiphospholipid antibody syndrome  bleeding disorders  bleeding in the brain  blood in your stools (black or  tarry stools) or if you have blood in your vomit  history of blood clots  history of stomach bleeding  kidney disease  liver disease  mechanical heart valve  an unusual or allergic reaction to apixaban, other medicines, foods, dyes, or preservatives  pregnant or trying to get pregnant  breast-feeding How should I use this medicine? Take this medicine by mouth with a glass of water. Follow the directions on the prescription label. You can take it with or without food. If it upsets your  stomach, take it with food. Take your medicine at regular intervals. Do not take it more often than directed. Do not stop taking except on your doctor's advice. Stopping this medicine may increase your risk of a blood clot. Be sure to refill your prescription before you run out of medicine. Talk to your pediatrician regarding the use of this medicine in children. Special care may be needed. Overdosage: If you think you have taken too much of this medicine contact a poison control center or emergency room at once. NOTE: This medicine is only for you. Do not share this medicine with others. What if I miss a dose? If you miss a dose, take it as soon as you can. If it is almost time for your next dose, take only that dose. Do not take double or extra doses. What may interact with this medicine? This medicine may interact with the following:  aspirin and aspirin-like medicines  certain medicines for fungal infections like ketoconazole and itraconazole  certain medicines for seizures like carbamazepine and phenytoin  certain medicines that treat or prevent blood clots like warfarin, enoxaparin, and dalteparin  clarithromycin  NSAIDs, medicines for pain and inflammation, like ibuprofen or naproxen  rifampin  ritonavir  St. John's wort This list may not describe all possible interactions. Give your health care provider a list of all the medicines, herbs, non-prescription drugs, or dietary supplements you use. Also tell them if you smoke, drink alcohol, or use illegal drugs. Some items may interact with your medicine. What should I watch for while using this medicine? Visit your healthcare professional for regular checks on your progress. You may need blood work done while you are taking this medicine. Your condition will be monitored carefully while you are receiving this medicine. It is important not to miss any appointments. Avoid sports and activities that might cause injury while you are  using this medicine. Severe falls or injuries can cause unseen bleeding. Be careful when using sharp tools or knives. Consider using an Copy. Take special care brushing or flossing your teeth. Report any injuries, bruising, or red spots on the skin to your healthcare professional. If you are going to need surgery or other procedure, tell your healthcare professional that you are taking this medicine. Wear a medical ID bracelet or chain. Carry a card that describes your disease and details of your medicine and dosage times. What side effects may I notice from receiving this medicine? Side effects that you should report to your doctor or health care professional as soon as possible:  allergic reactions like skin rash, itching or hives, swelling of the face, lips, or tongue  signs and symptoms of bleeding such as bloody or black, tarry stools; red or dark-brown urine; spitting up blood or brown material that looks like coffee grounds; red spots on the skin; unusual bruising or bleeding from the eye, gums, or nose  signs and symptoms of a blood clot such as chest pain; shortness of  breath; pain, swelling, or warmth in the leg  signs and symptoms of a stroke such as changes in vision; confusion; trouble speaking or understanding; severe headaches; sudden numbness or weakness of the face, arm or leg; trouble walking; dizziness; loss of coordination This list may not describe all possible side effects. Call your doctor for medical advice about side effects. You may report side effects to FDA at 1-800-FDA-1088. Where should I keep my medicine? Keep out of the reach of children. Store at room temperature between 20 and 25 degrees C (68 and 77 degrees F). Throw away any unused medicine after the expiration date. NOTE: This sheet is a summary. It may not cover all possible information. If you have questions about this medicine, talk to your doctor, pharmacist, or health care provider.  2020  Elsevier/Gold Standard (2018-08-07 17:39:34)   Pulmonary Embolism  A pulmonary embolism (PE) is a sudden blockage or decrease of blood flow in one or both lungs. Most blockages come from a blood clot that forms in the vein of a lower leg, thigh, or arm (deep vein thrombosis, DVT) and travels to the lungs. A clot is blood that has thickened into a gel or solid. PE is a dangerous and life-threatening condition that needs to be treated right away. What are the causes? This condition is usually caused by a blood clot that forms in a vein and moves to the lungs. In rare cases, it may be caused by air, fat, part of a tumor, or other tissue that moves through the veins and into the lungs. What increases the risk? The following factors may make you more likely to develop this condition:  Experiencing a traumatic injury, such as breaking a hip or leg.  Having: ? A spinal cord injury. ? Orthopedic surgery, especially hip or knee replacement. ? Any major surgery. ? A stroke. ? DVT. ? Blood clots or blood clotting disease. ? Long-term (chronic) lung or heart disease. ? Cancer treated with chemotherapy. ? A central venous catheter.  Taking medicines that contain estrogen. These include birth control pills and hormone replacement therapy.  Being: ? Pregnant. ? In the period of time after your baby is delivered (postpartum). ? Older than age 9. ? Overweight. ? A smoker, especially if you have other risks. What are the signs or symptoms? Symptoms of this condition usually start suddenly and include:  Shortness of breath during activity or at rest.  Coughing, coughing up blood, or coughing up blood-tinged mucus.  Chest pain that is often worse with deep breaths.  Rapid or irregular heartbeat.  Feeling light-headed or dizzy.  Fainting.  Feeling anxious.  Fever.  Sweating.  Pain and swelling in a leg. This is a symptom of DVT, which can lead to PE. How is this diagnosed? This  condition may be diagnosed based on:  Your medical history.  A physical exam.  Blood tests.  CT pulmonary angiogram. This test checks blood flow in and around your lungs.  Ventilation-perfusion scan, also called a lung VQ scan. This test measures air flow and blood flow to the lungs.  An ultrasound of the legs. How is this treated? Treatment for this condition depends on many factors, such as the cause of your PE, your risk for bleeding or developing more clots, and other medical conditions you have. Treatment aims to remove, dissolve, or stop blood clots from forming or growing larger. Treatment may include:  Medicines, such as: ? Blood thinning medicines (anticoagulants) to stop clots from forming. ?  Medicines that dissolve clots (thrombolytics).  Procedures, such as: ? Using a flexible tube to remove a blood clot (embolectomy) or to deliver medicine to destroy it (catheter-directed thrombolysis). ? Inserting a filter into a large vein that carries blood to the heart (inferior vena cava). This filter (vena cava filter) catches blood clots before they reach the lungs. ? Surgery to remove the clot (surgical embolectomy). This is rare. You may need a combination of immediate, long-term (up to 3 months after diagnosis), and extended (more than 3 months after diagnosis) treatments. Your treatment may continue for several months (maintenance therapy). You and your health care provider will work together to choose the treatment program that is best for you. Follow these instructions at home: Medicines  Take over-the-counter and prescription medicines only as told by your health care provider.  If you are taking an anticoagulant medicine: ? Take the medicine every day at the same time each day. ? Understand what foods and drugs interact with your medicine. ? Understand the side effects of this medicine, including excessive bruising or bleeding. Ask your health care provider or pharmacist  about other side effects. General instructions  Wear a medical alert bracelet or carry a medical alert card that says you have had a PE and lists what medicines you take.  Ask your health care provider when you may return to your normal activities. Avoid sitting or lying for a long time without moving.  Maintain a healthy weight. Ask your health care provider what weight is healthy for you.  Do not use any products that contain nicotine or tobacco, such as cigarettes, e-cigarettes, and chewing tobacco. If you need help quitting, ask your health care provider.  Talk with your health care provider about any travel plans. It is important to make sure that you are still able to take your medicine while on trips.  Keep all follow-up visits as told by your health care provider. This is important. Contact a health care provider if:  You missed a dose of your blood thinner medicine. Get help right away if:  You have: ? New or increased pain, swelling, warmth, or redness in an arm or leg. ? Numbness or tingling in an arm or leg. ? Shortness of breath during activity or at rest. ? A fever. ? Chest pain. ? A rapid or irregular heartbeat. ? A severe headache. ? Vision changes. ? A serious fall or accident, or you hit your head. ? Stomach (abdominal) pain. ? Blood in your vomit, stool, or urine. ? A cut that will not stop bleeding.  You cough up blood.  You feel light-headed or dizzy.  You cannot move your arms or legs.  You are confused or have memory loss. These symptoms may represent a serious problem that is an emergency. Do not wait to see if the symptoms will go away. Get medical help right away. Call your local emergency services (911 in the [place] Do not drive yourself to the hospital. Summary  A pulmonary embolism (PE) is a sudden blockage or decrease of blood flow in one or both lungs. PE is a dangerous and life-threatening condition that needs to be treated right  away.  Treatments for this condition usually include medicines to thin your blood (anticoagulants) or medicines to break apart blood clots (thrombolytics).  If you are given blood thinners, it is important to take the medicine every day at the same time each day.  Understand what foods and drugs interact with any medicines that  you are taking.  If you have signs of PE or DVT, call your local emergency services (911 in the [place] This information is not intended to replace advice given to you by your health care provider. Make sure you discuss any questions you have with your health care provider. Document Revised: 09/04/2018 Document Reviewed: 09/04/2018 Elsevier Patient Education  2020 [employer].

## 2020-06-04 NOTE — Progress Notes (Addendum)
Pt resting comfortably in bed, no c/o pain or sob.  Lung sounds clear/dim bil. No edema present. Pt unable to recall last bm but does have active bowel sounds, PRN laxative & stool softener administered.  Ambulation encouraged, and pt consult pending this morning. Will continue to monitor.

## 2020-06-06 LAB — AEROBIC/ANAEROBIC CULTURE W GRAM STAIN (SURGICAL/DEEP WOUND)
Culture: NO GROWTH
Special Requests: NORMAL

## 2020-06-07 ENCOUNTER — Telehealth: Payer: Self-pay

## 2020-06-07 NOTE — Telephone Encounter (Signed)
Transition Care Management Follow-up Telephone Call Date of discharge and from where: 06/04/2020, [facility]   Call placed #  [phone] to patient to discuss scheduling follow up appointment with PCP. Message left with call back requested to this CM.   Dr Chapman Fitch is listed as his PCP but he has not been seen at [facility]

## 2020-06-08 ENCOUNTER — Telehealth: Payer: Self-pay

## 2020-06-08 NOTE — Telephone Encounter (Signed)
Transition Care Management Follow-up Telephone Call Attempt # 2  Date of discharge and from where: 06/04/2020, [facility]   Call placed #  [phone] to patient to discuss scheduling follow up appointment with PCP. Message left with call back requested to this CM.   Dr Chapman Fitch is listed as his PCP but he has not been seen at [facility].  Letter sent to the patient requesting that he contact this office if he wishes to schedule an appointment

## 2020-06-15 ENCOUNTER — Ambulatory Visit: Payer: 59 | Admitting: Orthopedic Surgery

## 2020-06-23 ENCOUNTER — Ambulatory Visit: Payer: 59 | Admitting: Internal Medicine

## 2020-06-23 ENCOUNTER — Encounter: Payer: Self-pay | Admitting: Internal Medicine

## 2020-06-23 ENCOUNTER — Ambulatory Visit (INDEPENDENT_AMBULATORY_CARE_PROVIDER_SITE_OTHER): Payer: 59 | Admitting: Internal Medicine

## 2020-06-23 ENCOUNTER — Other Ambulatory Visit: Payer: Self-pay

## 2020-06-23 DIAGNOSIS — K750 Abscess of liver: Secondary | ICD-10-CM | POA: Diagnosis not present

## 2020-06-23 MED ORDER — CIPROFLOXACIN HCL 750 MG PO TABS: 750.0000 mg | ORAL_TABLET | Freq: Two times a day (BID) | ORAL | 0 refills | Status: AC

## 2020-06-23 MED ORDER — METRONIDAZOLE 500 MG PO TABS
500.0000 mg | ORAL_TABLET | Freq: Three times a day (TID) | ORAL | 0 refills | Status: AC
Start: 2020-06-23 — End: 2020-06-25

## 2020-06-23 NOTE — Assessment & Plan Note (Signed)
I suspect that his diverticulitis and liver abscess are probably cured.  He has been completely asymptomatic throughout this episode.  His abscess was drained completely and he has now had 3 and half weeks of antibiotic therapy.  He will complete the last 2 days of treatment then stop.  He will follow-up with me in 4 weeks.

## 2020-06-23 NOTE — Progress Notes (Signed)
[facility]  Patient Active Problem List   Diagnosis Date Noted  . Diverticulitis 06/01/2020    Priority: High  . Liver abscess 06/01/2020    Priority: High  . DVT (deep vein thrombosis) in pregnancy 06/02/2020  . Pulmonary embolus ([place]) 06/01/2020  . Renal insufficiency 06/01/2020  . Diabetes ([place]) 06/01/2020  . History of gout 06/01/2020  . Atherosclerotic peripheral vascular disease ([place]) 06/01/2020  . Septic shock ([place]) 05/28/2020    Patient's Medications  New Prescriptions   No medications on file  Previous Medications   ACETAMINOPHEN (TYLENOL) 500 MG TABLET    Take 1,000 mg by mouth every 6 (six) hours as needed for headache (pain).   ALLOPURINOL (ZYLOPRIM) 100 MG TABLET    Take 1 tablet (100 mg total) by mouth daily.   APIXABAN (ELIQUIS) VTE STARTER PACK (10MG  AND 5MG )    Take as directed on package: start with two-5mg  tablets twice daily for 7 days. On day 8, switch to one-5mg  tablet twice daily.   ASCORBIC ACID (VITAMIN C) 1000 MG TABLET    Take 1,000 mg by mouth daily.   CALCIUM-MAGNESIUM-VITAMIN D PO    Take 1 tablet by mouth daily.   LISINOPRIL-HYDROCHLOROTHIAZIDE (PRINZIDE,ZESTORETIC) 20-12.5 MG PER TABLET    TAKE ONE TABLET BY MOUTH EVERY DAY   METFORMIN (GLUCOPHAGE) 500 MG TABLET    Take 1 tablet (500 mg total) by mouth 2 (two) times daily with a meal.   SIMVASTATIN (ZOCOR) 20 MG TABLET    Take 20 mg by mouth at bedtime.    VITAMIN B-12 (CYANOCOBALAMIN) 1000 MCG TABLET    Take 1,000 mcg by mouth daily.  Modified Medications   Modified Medication Previous Medication   CIPROFLOXACIN (CIPRO) 750 MG TABLET ciprofloxacin (CIPRO) 750 MG tablet      Take 1 tablet (750 mg total) by mouth 2 (two) times daily for 2 days.    Take 1 tablet (750 mg total) by mouth 2 (two) times daily for 21 days.   METRONIDAZOLE (FLAGYL) 500 MG TABLET metroNIDAZOLE (FLAGYL) 500 MG tablet      Take 1 tablet (500 mg total) by mouth 3 (three) times daily for 2  days.    Take 1 tablet (500 mg total) by mouth 3 (three) times daily for 21 days.  Discontinued Medications   No medications on file    Subjective: Fernando Rhodes is in for his routine hospital follow-up visit.  He is a 64 y.o. male who was admitted recently after developing swelling and pain in his left calf associated with fever and syncope.  He was found to have a left leg DVT and pulmonary embolus.  Abdominal CT also showed the incidental finding of diverticulitis and a liver mass.  He was initially started on broad empiric antibiotic therapy but this was stopped after 3 days when he became afebrile and blood cultures were negative.  Yesterday the liver mass was aspirated yielding 12 cc of pus.  The abscess cavity was completely collapsed so no drain was placed.  No organisms were seen on Gram stain.  Cultures were negative.  I elected to change him to oral ciprofloxacin and metronidazole upon discharge.  He has now completed 23 days of antibiotic therapy.  He is feeling better other than persistent swelling of his left leg.  He has not had any problems tolerating his antibiotics.  Review of Systems: Review of Systems  Constitutional: Positive for weight loss. Negative for chills, diaphoresis  and fever.  Respiratory: Negative for cough.   Cardiovascular: Negative for chest pain.  Gastrointestinal: Negative for abdominal pain, diarrhea, nausea and vomiting.    Past Medical History:  Diagnosis Date  . Diabetes mellitus without complication ([place])   . Hypertension   . Renal insufficiency 06/01/2020    Social History   Tobacco Use  . Smoking status: Never Smoker  . Smokeless tobacco: Never Used  Substance Use Topics  . Alcohol use: No  . Drug use: No    No family history on file.  No Known Allergies  Objective: Vitals:   06/23/20 1441  BP: 136/77  Pulse: (!) 107  SpO2: 96%  Weight: 156 lb 12.8 oz (71.1 kg)   Body mass index is 23.16 kg/m.  Physical Exam Constitutional:       Comments: He is accompanied by his brother who answers most of the questions for him.  Cardiovascular:     Rate and Rhythm: Normal rate and regular rhythm.     Heart sounds: No murmur heard.   Pulmonary:     Effort: Pulmonary effort is normal.     Breath sounds: Normal breath sounds.  Abdominal:     General: There is no distension.     Palpations: Abdomen is soft. There is no mass.     Tenderness: There is no abdominal tenderness.  Musculoskeletal:     Right lower leg: No edema.     Left lower leg: Edema present.  Skin:    Findings: No rash.  Psychiatric:        Mood and Affect: Mood normal.       Problem List Items Addressed This Visit      High   Liver abscess    I suspect that his diverticulitis and liver abscess are probably cured.  He has been completely asymptomatic throughout this episode.  His abscess was drained completely and he has now had 3 and half weeks of antibiotic therapy.  He will complete the last 2 days of treatment then stop.  He will follow-up with me in 4 weeks.          Michel Bickers, MD [facility] [phone] pager   [phone] cell 06/23/2020, 3:06 PM

## 2020-06-29 NOTE — Progress Notes (Signed)
@Patient  ID: Fernando Rhodes, male    DOB: [birth year], 64 y.o.   MRN: [id number]  Chief Complaint  Patient presents with  . Follow-up    PHOS-Doing good, leg edema left side    Referring provider: Antony Blackbird, MD  HPI:  64 year old male never smoker followed in our office for history of VTE event, submassive PE in June/2021 requiring hospitalization  PMH: Diverticulitis, diabetes, history of gout, liver abscess Smoker/ Smoking History: Never smoker Maintenance:  None  Pt of: Dr. Carlis Abbott  06/30/2020  - Visit   64 year old male never smoker initially consulted with our practice when hospitalized in June/2021 for submassive PE.  Patient was admitted to the hospital on 05/27/2020 and discharged on 06/04/2020.  An excerpt of that discharge summary is listed below:  Hospital Course: 64 year old male with history of DM-2 HTN and gout presenting with malaise, weakness and weakness syncopal episode while urinating brought to ED by EMS.  On arrival to ED, he was hemodynamically stable. WBC 18. LA 4. Creatinine 1.7. He became hypotensive and remained hypotensive despite IV fluid boluses and admitted to ICU for septic shock. CT abdomen revealed sigmoid diverticulitis, heterogeneous peripherally enhancing lesion within the right liver lobe measuring 2.3 by 2.0 cm, and large diffuse low density seen throughout the liver parenchyma. He was started on broad-spectrum antibiotics.  The next day (6/19), CTA chest revealed acute submassive saddle PE with right heart strain. Lower extremity Doppler showed LLE DVT.  Treated with IV heparin and transition to Eliquis.  He was discharged on starter pack Eliquis.  Patient had MRI liver on 6/20 that showed abscess. IR consulted and drainedthe abscess on 6/22. Abscess culture NGTD.  Received ceftriaxone and IV Flagyl from 618-6/25 and discharged on p.o. Flagyl and Cipro for 21 days as recommended by infectious disease.  Patient was evaluated by  physical therapy and no need was identified.  Provided with work excuse letter.  See individual problem list below for more hospital course.  Discharge Diagnoses:  Submassive saddle PE/LLE DVT-Echo with normal RV function.  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 -Treated with IV heparin and transition to p.o. Eliquis. -Discharged on starter pack Eliquis.  Septic shock due to liver abscess and diverticulitis-blood, urine and liver abscess cultures NGTD. -Sepsis physiology resolved. -s/p needle aspiration by IR on 6/22.Abscess culture NGTD. -Treated with IV cefepime and Flagyl then IV ceftriaxone and Flagyl, and discharged on Cipro and Flagyl for 3 more weeks per ID recommendation. -Follow-up with ID on 06/23/2020.   Patient was initially consulted with our practice on 05/30/2020.  He was evaluated multiple times by Dr. Carlis Abbott.  We will plan on establishing with her.  Patient has completed follow-up with infectious disease Dr. Megan Salon on 06/23/2020.  An excerpt of that assessment and plan is listed below:  Liver abscess     I suspect that his diverticulitis and liver abscess are probably cured.  He has been completely asymptomatic throughout this episode.  His abscess was drained completely and he has now had 3 and half weeks of antibiotic therapy.  He will complete the last 2 days of treatment then stop.  He will follow-up with me in 4 weeks.      64 year old male never smoker presenting to office today as a follow-up from his hospital discharge.  He reports that he is doing well since last being discharged.  He confirms that he is completed follow-up with infectious disease.  He is also completed follow-up with primary care.  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  Patient still having persistent left lower extremity swelling.  Still having tenderness at that site but has improved since being discharged.  He  reports adherence to Eliquis.  Patient denies any other previous VTE events in the past.  This current PE and DVT event would be classified as unprovoked.  Patient has caregiver with him today is helping with providing information as well as patient's history.  Patient and patient's caregiver reports that patient has trouble "remembering at times"   Questionaires / Pulmonary Flowsheets:   ACT:  No flowsheet data found.  MMRC: No flowsheet data found.  Epworth:  No flowsheet data found.  Tests:   05/29/2020-CT angio chest-positive for acute saddle, bilateral central and segmental PE with CT suggestion of right heart strain (RV/LV ratio equals 0.9) consistent with at least submassive PE  05/31/2020-MRI liver-probable cystic lesion in the dome of the right hepatic lobe with thick enhancing septations and some evidence of septation along the margins on the delayed images, possibilities include abscesses, biliary cystadenoma/cystadenocarcinoma, cystic metastatic disease or atypical hemangioma  06/01/2020-lower extremity Doppler-right: There is no evidence of DVT in the lower extremity, left: Findings consistent with acute DVT involving the left femoral vein, left popliteal vein, left posterior tibial veins, left peroneal veins, and left gastroanemias veins  06/01/2020-cytology-liver fine-needle aspiration-no malignant cells identified, acute inflammation present  FENO:  No results found for: NITRICOXIDE  PFT: No flowsheet data found.  WALK:  SIX MIN WALK 06/30/2020  Supplimental Oxygen during Test? (L/min) No  Tech Comments: Completed 2 laps at an average pace. No complaints.    Imaging: IR US Guide Bx Asp/Drain  Result Date: 06/01/2020 INDICATION: POSTERIOR RIGHT HEPATIC DOME LESION CONCERNING FOR ABSCESS EXAM: ULTRASOUND RIGHT HEPATIC DOME ABSCESS ASPIRATION MEDICATIONS: The patient is currently admitted to the hospital and receiving intravenous antibiotics. The antibiotics were  administered within an appropriate time frame prior to the initiation of the procedure. ANESTHESIA/SEDATION: Fentanyl 75 mcg IV; Versed 1.5 mg IV Moderate Sedation Time:  13 MINUTES The patient was continuously monitored during the procedure by the interventional radiology nurse under my direct supervision. COMPLICATIONS: None immediate. PROCEDURE: Informed written consent was obtained from the patient after a thorough discussion of the procedural risks, benefits and alternatives. All questions were addressed. Maximal Sterile Barrier Technique was utilized including caps, mask, sterile gowns, sterile gloves, sterile drape, hand hygiene and skin antiseptic. A timeout was performed prior to the initiation of the procedure. previous imaging reviewed. pulmonary ultrasound performed. the right hepatic dome cystic lesion was localized and marked with ultrasound in the mid axillary line through a lower intercostal space. under sterile conditions and local anesthesia, an 18 gauge 15 cm access needle was advanced into the right hepatic dome cystic lesion. needle position confirmed with ultrasound. images obtained for documentation. syringe aspiration yielded 12 cc thick bloody exudative fluid compatible with abscess. This completely collapsed the cavity. Needle removed. Postprocedure imaging demonstrates no hemorrhage or hematoma. Patient tolerated the aspiration well. IMPRESSION: Successful CT-guided right hepatic dome abscess aspiration only. Sample sent for cytology and culture. Electronically Signed   By: Jerilynn Mages.  Shick M.D.   On: 06/01/2020 15:17   ECHOCARDIOGRAM COMPLETE  Result Date: 05/31/2020    ECHOCARDIOGRAM REPORT   Patient Name:   TUSHAR ENNS Date of Exam: 05/31/2020 Medical Rec #:  [id number]            Height:  69.0 in Accession #:    [id number]           Weight:       165.3 lb Date of Birth:  [birth year]            BSA:          1.906 m Patient Age:    71 years             BP:           126/110 mmHg  Patient Gender: M                    HR:           75 bpm. Exam Location:  Inpatient Procedure: 2D Echo, 3D Echo, Color Doppler and Cardiac Doppler Indications:    I26.02 Pulmonary embolus  History:        Patient has no prior history of Echocardiogram examinations.                 Risk Factors:Hypertension and Diabetes. Saddle PE.  Sonographer:    Raquel Sarna Senior RDCS Referring Phys: [id number] [place]  1. Normal LV function; normal RV size and function.  2. Left ventricular ejection fraction, by estimation, is 60 to 65%. The left ventricle has normal function. The left ventricle has no regional wall motion abnormalities. Left ventricular diastolic parameters were normal.  3. Right ventricular systolic function is normal. The right ventricular size is normal. Tricuspid regurgitation signal is inadequate for assessing PA pressure.  4. The mitral valve is normal in structure. Trivial mitral valve regurgitation. No evidence of mitral stenosis.  5. The aortic valve is tricuspid. Aortic valve regurgitation is not visualized. No aortic stenosis is present.  6. Mildly dilated pulmonary artery.  7. The inferior vena cava is normal in size with greater than 50% respiratory variability, suggesting right atrial pressure of 3 mmHg. FINDINGS  Left Ventricle: Left ventricular ejection fraction, by estimation, is 60 to 65%. The left ventricle has normal function. The left ventricle has no regional wall motion abnormalities. The left ventricular internal cavity size was normal in size. There is  no left ventricular hypertrophy. Left ventricular diastolic parameters were normal. Right Ventricle: The right ventricular size is normal. Right ventricular systolic function is normal. Tricuspid regurgitation signal is inadequate for assessing PA pressure. The tricuspid regurgitant velocity is 2.27 m/s, and with an assumed right atrial  pressure of 3 mmHg, the estimated right ventricular systolic pressure is 26.9 mmHg. Left  Atrium: Left atrial size was normal in size. Right Atrium: Right atrial size was normal in size. Pericardium: There is no evidence of pericardial effusion. Mitral Valve: The mitral valve is normal in structure. Normal mobility of the mitral valve leaflets. Trivial mitral valve regurgitation. No evidence of mitral valve stenosis. Tricuspid Valve: The tricuspid valve is normal in structure. Tricuspid valve regurgitation is trivial. No evidence of tricuspid stenosis. Aortic Valve: The aortic valve is tricuspid. Aortic valve regurgitation is not visualized. No aortic stenosis is present. Pulmonic Valve: The pulmonic valve was normal in structure. Pulmonic valve regurgitation is not visualized. No evidence of pulmonic stenosis. Aorta: The aortic root is normal in size and structure. Pulmonary Artery: The pulmonary artery is mildly dilated. Venous: The inferior vena cava is normal in size with greater than 50% respiratory variability, suggesting right atrial pressure of 3 mmHg. IAS/Shunts: No atrial level shunt detected by color flow Doppler. Additional Comments: Normal LV function; normal RV size and function.  LEFT VENTRICLE PLAX 2D LVIDd:         3.90 cm  Diastology LVIDs:         2.70 cm  LV e' medial:   8.16 cm/s LV PW:         0.90 cm  LV E/e' medial: 6.8 LV IVS:        1.20 cm LVOT diam:     2.20 cm LV SV:         74 LV SV Index:   39 LVOT Area:     3.80 cm  RIGHT VENTRICLE RV S prime:     11.90 cm/s TAPSE (M-mode): 2.5 cm LEFT ATRIUM             Index       RIGHT ATRIUM           Index LA diam:        3.00 cm 1.57 cm/m  RA Area:     13.40 cm LA Vol (A2C):   37.3 ml 19.57 ml/m RA Volume:   30.20 ml  15.85 ml/m LA Vol (A4C):   23.2 ml 12.18 ml/m LA Biplane Vol: 29.5 ml 15.48 ml/m  AORTIC VALVE LVOT Vmax:   86.80 cm/s LVOT Vmean:  66.300 cm/s LVOT VTI:    0.194 m  AORTA Ao Root diam: 3.60 cm Ao Asc diam:  3.20 cm MITRAL VALVE               TRICUSPID VALVE MV Area (PHT): 3.72 cm    TR Peak grad:   20.6 mmHg MV  Decel Time: 204 msec    TR Vmax:        227.00 cm/s MV E velocity: 55.70 cm/s MV A velocity: 48.00 cm/s  SHUNTS MV E/A ratio:  1.16        Systemic VTI:  0.19 m MV A Prime:    12.8 cm/s   Systemic Diam: 2.20 cm Kirk Ruths MD Electronically signed by Kirk Ruths MD Signature Date/Time: 05/31/2020/2:24:39 PM    Final    VAS [place] LOWER EXTREMITY VENOUS (DVT)  Result Date: 06/01/2020  Lower Venous DVTStudy Indications: Pulmonary embolism.  Performing Technologist: Antonieta Pert RDMS, RVT Supporting Technologist: Darlin Coco  Examination Guidelines: A complete evaluation includes B-mode imaging, spectral Doppler, color Doppler, and power Doppler as needed of all accessible portions of each vessel. Bilateral testing is considered an integral part of a complete examination. Limited examinations for reoccurring indications may be performed as noted. The reflux portion of the exam is performed with the patient in reverse Trendelenburg.  +---------+---------------+---------+-----------+----------+--------------+ RIGHT    CompressibilityPhasicitySpontaneityPropertiesThrombus Aging +---------+---------------+---------+-----------+----------+--------------+ CFV      Full           Yes      Yes                                 +---------+---------------+---------+-----------+----------+--------------+ SFJ      Full                                                        +---------+---------------+---------+-----------+----------+--------------+ FV Prox  Full                                                        +---------+---------------+---------+-----------+----------+--------------+  FV Mid   Full                                                        +---------+---------------+---------+-----------+----------+--------------+ FV DistalFull                                                        +---------+---------------+---------+-----------+----------+--------------+ PFV       Full                                                        +---------+---------------+---------+-----------+----------+--------------+ POP      Full           Yes      Yes                                 +---------+---------------+---------+-----------+----------+--------------+ PTV      Full                                                        +---------+---------------+---------+-----------+----------+--------------+ PERO     Full                                                        +---------+---------------+---------+-----------+----------+--------------+ GSV      Partial                                                     +---------+---------------+---------+-----------+----------+--------------+   +---------+---------------+---------+-----------+----------+--------------+ LEFT     CompressibilityPhasicitySpontaneityPropertiesThrombus Aging +---------+---------------+---------+-----------+----------+--------------+ CFV      Full           Yes      Yes                                 +---------+---------------+---------+-----------+----------+--------------+ SFJ      Full                                                        +---------+---------------+---------+-----------+----------+--------------+ FV Prox  None  Acute          +---------+---------------+---------+-----------+----------+--------------+ FV Mid   None                                         Acute          +---------+---------------+---------+-----------+----------+--------------+ FV DistalNone                                         Acute          +---------+---------------+---------+-----------+----------+--------------+ PFV      Full                                         Acute          +---------+---------------+---------+-----------+----------+--------------+ POP      None           No       No                    Acute          +---------+---------------+---------+-----------+----------+--------------+ PTV      None                                         Acute          +---------+---------------+---------+-----------+----------+--------------+ PERO     None                                         Acute          +---------+---------------+---------+-----------+----------+--------------+ Gastroc  None                                         Acute          +---------+---------------+---------+-----------+----------+--------------+ GSV      Full                                                        +---------+---------------+---------+-----------+----------+--------------+     Summary: RIGHT: - There is no evidence of deep vein thrombosis in the lower extremity.  - No cystic structure found in the popliteal fossa.  LEFT: - Findings consistent with acute deep vein thrombosis involving the left femoral vein, left popliteal vein, left posterior tibial veins, left peroneal veins, and left gastrocnemius veins. - No cystic structure found in the popliteal fossa.  *See table(s) above for measurements and observations. Electronically signed by Servando Snare MD on 06/01/2020 at 5:13:35 PM.    Final     Lab Results:  CBC    Component Value Date/Time   WBC 8.5 06/04/2020 0422   RBC 4.22 06/04/2020 0422   HGB 13.4 06/04/2020 0422   HCT 38.9 (L) 06/04/2020 0422   PLT 272 06/04/2020 0422  MCV 92.2 06/04/2020 0422   MCH 31.8 06/04/2020 0422   MCHC 34.4 06/04/2020 0422   RDW 12.5 06/04/2020 0422   LYMPHSABS 1.9 06/04/2020 0422   MONOABS 0.6 06/04/2020 0422   EOSABS 0.4 06/04/2020 0422   BASOSABS 0.1 06/04/2020 0422    BMET    Component Value Date/Time   NA 135 06/04/2020 0422   K 3.7 06/04/2020 0422   CL 101 06/04/2020 0422   CO2 23 06/04/2020 0422   GLUCOSE 118 (H) 06/04/2020 0422   BUN 13 06/04/2020 0422   CREATININE 1.07 06/04/2020 0422   CALCIUM 8.6 (L) 06/04/2020 0422    GFRNONAA >60 06/04/2020 0422   GFRAA >60 06/04/2020 0422    BNP No results found for: BNP  ProBNP No results found for: PROBNP  Specialty Problems    None      No Known Allergies  Immunization History  Administered Date(s) Administered  . Tdap 05/08/2015    Past Medical History:  Diagnosis Date  . Diabetes mellitus without complication ([place])   . Hypertension   . Renal insufficiency 06/01/2020    Tobacco History: Social History   Tobacco Use  Smoking Status Never Smoker  Smokeless Tobacco Never Used   Counseling given: Not Answered   Continue to not smoke  Outpatient Encounter Medications as of 06/30/2020  Medication Sig  . APIXABAN (ELIQUIS) VTE STARTER PACK (10MG  AND 5MG ) Take as directed on package: start with two-5mg  tablets twice daily for 7 days. On day 8, switch to one-5mg  tablet twice daily.  . ciprofloxacin (CIPRO) 750 MG tablet Take 750 mg by mouth 2 (two) times daily.  . metFORMIN (GLUCOPHAGE) 500 MG tablet Take 1 tablet (500 mg total) by mouth 2 (two) times daily with a meal.  . metroNIDAZOLE (FLAGYL) 500 MG tablet Take 500 mg by mouth 3 (three) times daily.  . Multiple Vitamins-Minerals (MULTIVITAMIN GUMMIES ADULT PO) Take by mouth. Take 2 gummies daily  . simvastatin (ZOCOR) 20 MG tablet Take 20 mg by mouth at bedtime.   Marland Kitchen acetaminophen (TYLENOL) 500 MG tablet Take 1,000 mg by mouth every 6 (six) hours as needed for headache (pain). (Patient not taking: Reported on 06/23/2020)  . allopurinol (ZYLOPRIM) 100 MG tablet Take 1 tablet (100 mg total) by mouth daily. (Patient not taking: Reported on 06/23/2020)  . Ascorbic Acid (VITAMIN C) 1000 MG tablet Take 1,000 mg by mouth daily. (Patient not taking: Reported on 06/23/2020)  . CALCIUM-MAGNESIUM-VITAMIN D PO Take 1 tablet by mouth daily. (Patient not taking: Reported on 06/23/2020)  . lisinopril-hydrochlorothiazide (PRINZIDE,ZESTORETIC) 20-12.5 MG per tablet TAKE ONE TABLET BY MOUTH EVERY DAY (Patient not  taking: Reported on 06/23/2020)  . vitamin B-12 (CYANOCOBALAMIN) 1000 MCG tablet Take 1,000 mcg by mouth daily. (Patient not taking: Reported on 06/23/2020)   No facility-administered encounter medications on file as of 06/30/2020.     Review of Systems  Review of Systems  Constitutional: Negative for activity change, chills, fatigue, fever and unexpected weight change.  HENT: Negative for postnasal drip, rhinorrhea, sinus pressure, sinus pain and sore throat.   Eyes: Negative.   Respiratory: Negative for cough, shortness of breath and wheezing.   Cardiovascular: Positive for leg swelling (LLE). Negative for chest pain and palpitations.  Gastrointestinal: Negative for constipation, diarrhea, nausea and vomiting.  Endocrine: Negative.   Genitourinary: Negative.   Musculoskeletal: Negative.   Skin: Negative.   Neurological: Negative for dizziness and headaches.  Psychiatric/Behavioral: Positive for confusion. Negative for dysphoric mood. The patient is not nervous/anxious.  All other systems reviewed and are negative.    Physical Exam  BP 112/62 (BP Location: Left Arm, Cuff Size: Normal)   Pulse 68   Temp 97.7 F (36.5 C) (Oral)   Ht 5\' 6"  (1.676 m)   Wt 159 lb 12.8 oz (72.5 kg)   SpO2 98%   BMI 25.79 kg/m   Wt Readings from Last 5 Encounters:  06/30/20 159 lb 12.8 oz (72.5 kg)  06/23/20 156 lb 12.8 oz (71.1 kg)  06/01/20 161 lb 9.6 oz (73.3 kg)    BMI Readings from Last 5 Encounters:  06/30/20 25.79 kg/m  06/23/20 23.16 kg/m  06/01/20 23.86 kg/m     Physical Exam Vitals and nursing note reviewed.  Constitutional:      General: He is not in acute distress.    Appearance: Normal appearance. He is normal weight.  HENT:     Head: Normocephalic and atraumatic.     Right Ear: Hearing, tympanic membrane, ear canal and external ear normal. There is no impacted cerumen.     Left Ear: Hearing, tympanic membrane, ear canal and external ear normal. There is no impacted  cerumen.     Nose: Nose normal. No mucosal edema or rhinorrhea.     Right Turbinates: Not enlarged.     Left Turbinates: Not enlarged.     Mouth/Throat:     Mouth: Mucous membranes are dry.     Pharynx: Oropharynx is clear. No oropharyngeal exudate.  Eyes:     Pupils: Pupils are equal, round, and reactive to light.  Cardiovascular:     Rate and Rhythm: Normal rate and regular rhythm.     Pulses: Normal pulses.     Heart sounds: Normal heart sounds. No murmur heard.   Pulmonary:     Effort: Pulmonary effort is normal.     Breath sounds: Normal breath sounds. No decreased breath sounds, wheezing or rales.  Musculoskeletal:     Cervical back: Normal range of motion.     Right lower leg: No edema.     Left lower leg: No edema.  Lymphadenopathy:     Cervical: No cervical adenopathy.  Skin:    General: Skin is warm and dry.     Capillary Refill: Capillary refill takes less than 2 seconds.     Findings: No erythema or rash.  Neurological:     General: No focal deficit present.     Mental Status: He is alert and oriented to person, place, and time.     Motor: No weakness.     Coordination: Coordination normal.     Gait: Gait is intact. Gait normal.  Psychiatric:        Mood and Affect: Mood normal.        Behavior: Behavior normal. Behavior is cooperative.        Thought Content: Thought content normal.        Cognition and Memory: Memory is impaired.        Judgment: Judgment normal.       Assessment & Plan:   Liver abscess Biopsy on 06/01/2020 shows no malignant cells Clinically improving based off of infectious disease evaluation on 06/23/2020  Plan: Keep follow-up with infectious disease   DVT (deep vein thrombosis) in pregnancy Still having left lower extremity swelling  Plan: Continue Eliquis We will continue to clinically monitor If left lower extremity leg pain worsens patient to contact our office May need to consider referral to vascular if pain worsens or  lower extremity  swelling persist despite adequate therapy Agree the patient should be wearing compression stockings, 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 Carlis Abbott May need to consider hematology referral prior to stopping Eliquis Could consider at next office visit repeating echocardiogram and lower extremity Dopplers based off of clinical exam    Return in about 3 months (around 09/30/2020), or if symptoms worsen or fail to improve, for Follow up with Dr. Carlis Abbott, [place].   Lauraine Rinne, NP 06/30/2020   This appointment required 42 minutes of patient care (this includes precharting, chart review, review of results, face-to-face care, etc.).

## 2020-06-30 ENCOUNTER — Ambulatory Visit (INDEPENDENT_AMBULATORY_CARE_PROVIDER_SITE_OTHER): Payer: 59 | Admitting: Pulmonary Disease

## 2020-06-30 ENCOUNTER — Encounter: Payer: Self-pay | Admitting: Pulmonary Disease

## 2020-06-30 ENCOUNTER — Other Ambulatory Visit: Payer: Self-pay

## 2020-06-30 VITALS — BP 112/62 | HR 68 | Temp 97.7°F | Ht 66.0 in | Wt 159.8 lb

## 2020-06-30 DIAGNOSIS — I2602 Saddle embolus of pulmonary artery with acute cor pulmonale: Secondary | ICD-10-CM

## 2020-06-30 DIAGNOSIS — I8290 Acute embolism and thrombosis of unspecified vein: Secondary | ICD-10-CM

## 2020-06-30 DIAGNOSIS — K750 Abscess of liver: Secondary | ICD-10-CM | POA: Diagnosis not present

## 2020-06-30 DIAGNOSIS — O2230 Deep phlebothrombosis in pregnancy, unspecified trimester: Secondary | ICD-10-CM

## 2020-06-30 NOTE — Assessment & Plan Note (Signed)
Plan:  Today walk today in office, stable with no oxygen desaturations Continue Eliquis

## 2020-06-30 NOTE — Assessment & Plan Note (Signed)
No previous history of VTE events in the past June/2021 segmental PE with right heart strain as well as significant left lower extremity DVT are considered unprovoked  Plan: Continue Eliquis for at least 6 months 65-month follow-up with Dr. Carlis Abbott May need to consider hematology referral prior to stopping Eliquis Could consider at next office visit repeating echocardiogram and lower extremity Dopplers based off of clinical exam

## 2020-06-30 NOTE — Patient Instructions (Addendum)
You were seen today by Lauraine Rinne, NP  for:   1. VTE (venous thromboembolism) 2. Acute saddle pulmonary embolism with acute cor pulmonale (HCC) 3. DVT (deep vein thrombosis) in pregnancy  Walk today in office   Continue Eliquis  At next office visit will likely need lab work to monitor kidney function as well as liver levels and if not already done by primary care  If lab work is done at primary care office please have them route this to our office so that way we can get this scanned in the chart  Notify our office if you have any acute worsening shortness of breath, coughing up blood, or worsening leg pain  I will discuss your lower left extremity swelling with Dr. Carlis Abbott, I think for right now we will clinically monitor this, could consider referral to vascular surgeon in the future if necessary  I agree with primary care's recommendations to wear compression stockings when able as you work very long shifts on your feet which is making it harder for the lower left extremity swelling to clear  May need to consider repeating lower extremity ultrasound in 3 to 6 months  May need to consider referral to hematology prior to stopping blood thinner after 6 months of treatment  4. Liver abscess  Keep follow-up with infectious disease  Follow Up:    No follow-ups on file.   Please do your part to reduce the spread of COVID-19:      Reduce your risk of any infection  and COVID19 by using the similar precautions used for avoiding the common cold or flu:   Wash your hands often with soap and warm water for at least 20 seconds.  If soap and water are not readily available, use an alcohol-based hand sanitizer with at least 60% alcohol.   If coughing or sneezing, cover your mouth and nose by coughing or sneezing into the elbow areas of your shirt or coat, into a tissue or into your sleeve (not your hands).  WEAR A MASK when in public   Avoid shaking hands with others and consider  head nods or verbal greetings only.  Avoid touching your eyes, nose, or mouth with unwashed hands.   Avoid close contact with people who are sick.  Avoid places or events with large numbers of people in one location, like concerts or sporting events.  If you have some symptoms but not all symptoms, continue to monitor at home and seek medical attention if your symptoms worsen.  If you are having a medical emergency, call 911.   [place] / e-Visit: [email or web address]         [facility]: [facility]: [phone]                   [facility]: [phone]     It is flu season:   >>> Best ways to protect herself from the flu: Receive the yearly flu vaccine, practice good hand hygiene washing with soap and also using hand sanitizer when available, eat a nutritious meals, get adequate rest, hydrate appropriately   Please contact the office if your symptoms worsen or you have concerns that you are not improving.   Thank you for choosing [facility] for your healthcare, and for allowing [place] to partner with you on your healthcare journey. I am thankful to be able to provide care to you today.  Wyn Quaker FNP-C

## 2020-06-30 NOTE — Assessment & Plan Note (Signed)
Biopsy on 06/01/2020 shows no malignant cells Clinically improving based off of infectious disease evaluation on 06/23/2020  Plan: Keep follow-up with infectious disease

## 2020-06-30 NOTE — Assessment & Plan Note (Signed)
Still having left lower extremity swelling  Plan: Continue Eliquis We will continue to clinically monitor If left lower extremity leg pain worsens patient to contact our office May need to consider referral to vascular if pain worsens or lower extremity swelling persist despite adequate therapy Agree the patient should be wearing compression stockings, would limit compression stocking use to 16 hours a day

## 2020-07-27 ENCOUNTER — Encounter: Payer: Self-pay | Admitting: Internal Medicine

## 2020-07-27 ENCOUNTER — Other Ambulatory Visit: Payer: Self-pay

## 2020-07-27 ENCOUNTER — Ambulatory Visit (INDEPENDENT_AMBULATORY_CARE_PROVIDER_SITE_OTHER): Payer: 59 | Admitting: Internal Medicine

## 2020-07-27 DIAGNOSIS — K750 Abscess of liver: Secondary | ICD-10-CM

## 2020-07-27 NOTE — Progress Notes (Signed)
[facility] for Infectious Disease  Patient Active Problem List   Diagnosis Date Noted  . Diverticulitis 06/01/2020    Priority: High  . Liver abscess 06/01/2020    Priority: High  . VTE (venous thromboembolism) 06/30/2020  . DVT (deep vein thrombosis) in pregnancy 06/02/2020  . Pulmonary embolus ([place]) 06/01/2020  . Renal insufficiency 06/01/2020  . Diabetes ([place]) 06/01/2020  . History of gout 06/01/2020  . Atherosclerotic peripheral vascular disease ([place]) 06/01/2020  . Septic shock ([place]) 05/28/2020    Patient's Medications  New Prescriptions   No medications on file  Previous Medications   ACETAMINOPHEN (TYLENOL) 500 MG TABLET    Take 1,000 mg by mouth every 6 (six) hours as needed for headache (pain).   ALLOPURINOL (ZYLOPRIM) 100 MG TABLET    Take 1 tablet (100 mg total) by mouth daily.   APIXABAN (ELIQUIS) VTE STARTER PACK (10MG  AND 5MG )    Take as directed on package: start with two-5mg  tablets twice daily for 7 days. On day 8, switch to one-5mg  tablet twice daily.   ASCORBIC ACID (VITAMIN C) 1000 MG TABLET    Take 1,000 mg by mouth daily.   CALCIUM-MAGNESIUM-VITAMIN D PO    Take 1 tablet by mouth daily.   LISINOPRIL-HYDROCHLOROTHIAZIDE (PRINZIDE,ZESTORETIC) 20-12.5 MG PER TABLET    TAKE ONE TABLET BY MOUTH EVERY DAY   METFORMIN (GLUCOPHAGE) 500 MG TABLET    Take 1 tablet (500 mg total) by mouth 2 (two) times daily with a meal.   MULTIPLE VITAMINS-MINERALS (MULTIVITAMIN GUMMIES ADULT PO)    Take by mouth. Take 2 gummies daily   SIMVASTATIN (ZOCOR) 20 MG TABLET    Take 20 mg by mouth at bedtime.    VITAMIN B-12 (CYANOCOBALAMIN) 1000 MCG TABLET    Take 1,000 mcg by mouth daily.   Modified Medications   No medications on file  Discontinued Medications   CIPROFLOXACIN (CIPRO) 750 MG TABLET    Take 750 mg by mouth 2 (two) times daily.   METRONIDAZOLE (FLAGYL) 500 MG TABLET    Take 500 mg by mouth 3 (three) times daily.    Subjective: Fernando Rhodes is in for Fernando Rhodes  routine hospital follow-up visit.  He is a 64 y.o. male who was admitted recently after developing swelling and pain in Fernando Rhodes left calf associated with fever and syncope.  He was found to have a left leg DVT and pulmonary embolus.  Abdominal CT also showed the incidental finding of diverticulitis and a liver mass.  He was initially started on broad empiric antibiotic therapy but this was stopped after 3 days when he became afebrile and blood cultures were negative.  The liver mass was aspirated yielding 12 cc of pus.  The abscess cavity was completely collapsed so no drain was placed.  No organisms were seen on Gram stain.  Cultures were negative.  I elected to change him to oral ciprofloxacin and metronidazole upon discharge. He completed 25 days of antibiotic therapy 1 month ago.  He is feeling better other than persistent swelling of Fernando Rhodes left leg.    Review of Systems: Review of Systems  Constitutional: Negative for chills, diaphoresis, fever and weight loss.  Respiratory: Negative for cough.   Cardiovascular: Negative for chest pain.  Gastrointestinal: Negative for abdominal pain, diarrhea, nausea and vomiting.    Past Medical History:  Diagnosis Date  . Diabetes mellitus without complication ([place])   . Hypertension   . Renal insufficiency 06/01/2020  Social History   Tobacco Use  . Smoking status: Never Smoker  . Smokeless tobacco: Never Used  Substance Use Topics  . Alcohol use: No  . Drug use: No    No family history on file.  No Known Allergies  Objective: Vitals:   07/27/20 1523  BP: (!) 155/90  Pulse: 76  Temp: 97.6 F (36.4 C)  TempSrc: Oral  Weight: 162 lb (73.5 kg)   Body mass index is 26.15 kg/m.  Physical Exam Constitutional:      Comments: He is accompanied by Fernando Rhodes brother.  Cardiovascular:     Rate and Rhythm: Normal rate and regular rhythm.     Heart sounds: No murmur heard.   Pulmonary:     Effort: Pulmonary effort is normal.     Breath sounds:  Normal breath sounds.  Abdominal:     General: There is no distension.     Palpations: Abdomen is soft. There is no mass.     Tenderness: There is no abdominal tenderness.  Musculoskeletal:     Right lower leg: No edema.     Left lower leg: Edema present.  Skin:    Findings: No rash.  Psychiatric:        Mood and Affect: Mood normal.       Problem List Items Addressed This Visit      High   Liver abscess    I strongly suspect that Fernando Rhodes liver abscess has been cured. He can follow-up here as needed.          Fernando Bickers, MD [facility] for Infectious [place] [facility] [phone] pager   [phone] cell 07/27/2020, 3:40 PM

## 2020-07-27 NOTE — Assessment & Plan Note (Signed)
I strongly suspect that his liver abscess has been cured. He can follow-up here as needed.

## 2022-02-10 ENCOUNTER — Inpatient Hospital Stay (HOSPITAL_COMMUNITY): Payer: 59

## 2022-02-10 ENCOUNTER — Emergency Department (HOSPITAL_COMMUNITY): Payer: 59

## 2022-02-10 ENCOUNTER — Other Ambulatory Visit: Payer: Self-pay

## 2022-02-10 ENCOUNTER — Encounter (HOSPITAL_COMMUNITY): Payer: Self-pay | Admitting: Family Medicine

## 2022-02-10 ENCOUNTER — Inpatient Hospital Stay (HOSPITAL_COMMUNITY)
Admission: EM | Admit: 2022-02-10 | Discharge: 2022-02-13 | DRG: 065 | Disposition: A | Discharge status: Home / Self Care | Payer: 59 | Attending: Internal Medicine | Admitting: Internal Medicine

## 2022-02-10 DIAGNOSIS — Z20822 Contact with and (suspected) exposure to covid-19: Secondary | ICD-10-CM | POA: Diagnosis present

## 2022-02-10 DIAGNOSIS — E876 Hypokalemia: Secondary | ICD-10-CM | POA: Diagnosis present

## 2022-02-10 DIAGNOSIS — I82512 Chronic embolism and thrombosis of left femoral vein: Secondary | ICD-10-CM | POA: Diagnosis present

## 2022-02-10 DIAGNOSIS — I6389 Other cerebral infarction: Secondary | ICD-10-CM

## 2022-02-10 DIAGNOSIS — E785 Hyperlipidemia, unspecified: Secondary | ICD-10-CM | POA: Diagnosis present

## 2022-02-10 DIAGNOSIS — M109 Gout, unspecified: Secondary | ICD-10-CM | POA: Diagnosis present

## 2022-02-10 DIAGNOSIS — I129 Hypertensive chronic kidney disease with stage 1 through stage 4 chronic kidney disease, or unspecified chronic kidney disease: Secondary | ICD-10-CM | POA: Diagnosis present

## 2022-02-10 DIAGNOSIS — R252 Cramp and spasm: Secondary | ICD-10-CM | POA: Diagnosis present

## 2022-02-10 DIAGNOSIS — G9389 Other specified disorders of brain: Secondary | ICD-10-CM | POA: Diagnosis present

## 2022-02-10 DIAGNOSIS — E1122 Type 2 diabetes mellitus with diabetic chronic kidney disease: Secondary | ICD-10-CM | POA: Diagnosis present

## 2022-02-10 DIAGNOSIS — Z8673 Personal history of transient ischemic attack (TIA), and cerebral infarction without residual deficits: Secondary | ICD-10-CM

## 2022-02-10 DIAGNOSIS — I69354 Hemiplegia and hemiparesis following cerebral infarction affecting left non-dominant side: Secondary | ICD-10-CM

## 2022-02-10 DIAGNOSIS — I82532 Chronic embolism and thrombosis of left popliteal vein: Secondary | ICD-10-CM | POA: Diagnosis present

## 2022-02-10 DIAGNOSIS — Z86718 Personal history of other venous thrombosis and embolism: Secondary | ICD-10-CM | POA: Diagnosis not present

## 2022-02-10 DIAGNOSIS — R4189 Other symptoms and signs involving cognitive functions and awareness: Secondary | ICD-10-CM | POA: Diagnosis present

## 2022-02-10 DIAGNOSIS — I69398 Other sequelae of cerebral infarction: Secondary | ICD-10-CM | POA: Diagnosis not present

## 2022-02-10 DIAGNOSIS — I639 Cerebral infarction, unspecified: Secondary | ICD-10-CM | POA: Diagnosis present

## 2022-02-10 DIAGNOSIS — Z86711 Personal history of pulmonary embolism: Secondary | ICD-10-CM | POA: Diagnosis present

## 2022-02-10 DIAGNOSIS — R569 Unspecified convulsions: Secondary | ICD-10-CM

## 2022-02-10 DIAGNOSIS — I63542 Cerebral infarction due to unspecified occlusion or stenosis of left cerebellar artery: Principal | ICD-10-CM | POA: Diagnosis present

## 2022-02-10 DIAGNOSIS — Z7984 Long term (current) use of oral hypoglycemic drugs: Secondary | ICD-10-CM | POA: Diagnosis not present

## 2022-02-10 DIAGNOSIS — R29810 Facial weakness: Secondary | ICD-10-CM | POA: Diagnosis present

## 2022-02-10 DIAGNOSIS — Z79899 Other long term (current) drug therapy: Secondary | ICD-10-CM | POA: Diagnosis not present

## 2022-02-10 DIAGNOSIS — Z7901 Long term (current) use of anticoagulants: Secondary | ICD-10-CM

## 2022-02-10 DIAGNOSIS — R4182 Altered mental status, unspecified: Secondary | ICD-10-CM | POA: Diagnosis present

## 2022-02-10 DIAGNOSIS — I82403 Acute embolism and thrombosis of unspecified deep veins of lower extremity, bilateral: Secondary | ICD-10-CM | POA: Diagnosis not present

## 2022-02-10 DIAGNOSIS — R29705 NIHSS score 5: Secondary | ICD-10-CM | POA: Diagnosis present

## 2022-02-10 DIAGNOSIS — I608 Other nontraumatic subarachnoid hemorrhage: Secondary | ICD-10-CM | POA: Diagnosis present

## 2022-02-10 DIAGNOSIS — N1831 Chronic kidney disease, stage 3a: Secondary | ICD-10-CM | POA: Diagnosis present

## 2022-02-10 DIAGNOSIS — R471 Dysarthria and anarthria: Secondary | ICD-10-CM | POA: Diagnosis present

## 2022-02-10 DIAGNOSIS — I609 Nontraumatic subarachnoid hemorrhage, unspecified: Secondary | ICD-10-CM | POA: Diagnosis not present

## 2022-02-10 LAB — COMPREHENSIVE METABOLIC PANEL
ALT: 15 U/L (ref 0–44)
AST: 24 U/L (ref 15–41)
Albumin: 3.6 g/dL (ref 3.5–5.0)
Alkaline Phosphatase: 78 U/L (ref 38–126)
Anion gap: 16 — ABNORMAL HIGH (ref 5–15)
BUN: 15 mg/dL (ref 8–23)
CO2: 16 mmol/L — ABNORMAL LOW (ref 22–32)
Calcium: 8.6 mg/dL — ABNORMAL LOW (ref 8.9–10.3)
Chloride: 103 mmol/L (ref 98–111)
Creatinine, Ser: 1.36 mg/dL — ABNORMAL HIGH (ref 0.61–1.24)
GFR, Estimated: 58 mL/min — ABNORMAL LOW (ref >60)
Glucose, Bld: 200 mg/dL — ABNORMAL HIGH (ref 70–99)
Potassium: 3.4 mmol/L — ABNORMAL LOW (ref 3.5–5.1)
Sodium: 135 mmol/L (ref 135–145)
Total Bilirubin: 0.6 mg/dL (ref 0.3–1.2)
Total Protein: 6.4 g/dL — ABNORMAL LOW (ref 6.5–8.1)

## 2022-02-10 LAB — CBC
HCT: 42.8 % (ref 39.0–52.0)
Hemoglobin: 14.7 g/dL (ref 13.0–17.0)
MCH: 32.9 pg (ref 26.0–34.0)
MCHC: 34.3 g/dL (ref 30.0–36.0)
MCV: 95.7 fL (ref 80.0–100.0)
Platelets: 200 10*3/uL (ref 150–400)
RBC: 4.47 MIL/uL (ref 4.22–5.81)
RDW: 12.5 % (ref 11.5–15.5)
WBC: 7.9 10*3/uL (ref 4.0–10.5)
nRBC: 0 % (ref 0.0–0.2)

## 2022-02-10 LAB — ECHOCARDIOGRAM COMPLETE BUBBLE STUDY
AR max vel: 3.55 cm2
AV Area VTI: 3.5 cm2
AV Area mean vel: 3.36 cm2
AV Mean grad: 2 mmHg
AV Peak grad: 3 mmHg
Ao pk vel: 0.86 m/s
Area-P 1/2: 3.91 cm2
Calc EF: 64.5 %
S' Lateral: 2.4 cm
Single Plane A2C EF: 55.8 %
Single Plane A4C EF: 65.6 %

## 2022-02-10 LAB — HEMOGLOBIN A1C
Hgb A1c MFr Bld: 5.7 % — ABNORMAL HIGH (ref 4.8–5.6)
Mean Plasma Glucose: 116.89 mg/dL

## 2022-02-10 LAB — DIFFERENTIAL
Abs Immature Granulocytes: 0.03 10*3/uL (ref 0.00–0.07)
Basophils Absolute: 0.1 10*3/uL (ref 0.0–0.1)
Basophils Relative: 1 %
Eosinophils Absolute: 0.3 10*3/uL (ref 0.0–0.5)
Eosinophils Relative: 3 %
Immature Granulocytes: 0 %
Lymphocytes Relative: 41 %
Lymphs Abs: 3.2 10*3/uL (ref 0.7–4.0)
Monocytes Absolute: 0.5 10*3/uL (ref 0.1–1.0)
Monocytes Relative: 7 %
Neutro Abs: 3.7 10*3/uL (ref 1.7–7.7)
Neutrophils Relative %: 48 %

## 2022-02-10 LAB — I-STAT CHEM 8, ED
BUN: 18 mg/dL (ref 8–23)
Calcium, Ion: 0.98 mmol/L — ABNORMAL LOW (ref 1.15–1.40)
Chloride: 105 mmol/L (ref 98–111)
Creatinine, Ser: 1.4 mg/dL — ABNORMAL HIGH (ref 0.61–1.24)
Glucose, Bld: 188 mg/dL — ABNORMAL HIGH (ref 70–99)
HCT: 41 % (ref 39.0–52.0)
Hemoglobin: 13.9 g/dL (ref 13.0–17.0)
Potassium: 3.5 mmol/L (ref 3.5–5.1)
Sodium: 139 mmol/L (ref 135–145)
TCO2: 20 mmol/L — ABNORMAL LOW (ref 22–32)

## 2022-02-10 LAB — GLUCOSE, CAPILLARY
Glucose-Capillary: 108 mg/dL — ABNORMAL HIGH (ref 70–99)
Glucose-Capillary: 111 mg/dL — ABNORMAL HIGH (ref 70–99)

## 2022-02-10 LAB — RESP PANEL BY RT-PCR (FLU A&B, COVID) ARPGX2
Influenza A by PCR: NEGATIVE
Influenza B by PCR: NEGATIVE
SARS Coronavirus 2 by RT PCR: NEGATIVE

## 2022-02-10 LAB — CBG MONITORING, ED
Glucose-Capillary: 191 mg/dL — ABNORMAL HIGH (ref 70–99)
Glucose-Capillary: 116 mg/dL — ABNORMAL HIGH (ref 70–99)
Glucose-Capillary: 115 mg/dL — ABNORMAL HIGH (ref 70–99)

## 2022-02-10 LAB — APTT: aPTT: 28 seconds (ref 24–36)

## 2022-02-10 LAB — PROTIME-INR
INR: 1.1 (ref 0.8–1.2)
Prothrombin Time: 14.4 seconds (ref 11.4–15.2)

## 2022-02-10 MED ORDER — IOHEXOL 350 MG/ML SOLN
75.0000 mL | Freq: Once | INTRAVENOUS | Status: AC | PRN
  Administered 2022-02-10: 75 mL via INTRAVENOUS

## 2022-02-10 MED ORDER — ADULT MULTIVITAMIN W/MINERALS CH
1.0000 | ORAL_TABLET | Freq: Every day | ORAL | Status: DC
  Administered 2022-02-10 – 2022-02-13 (×4): 1 via ORAL
  Filled 2022-02-10 (×4): qty 1

## 2022-02-10 MED ORDER — CEFAZOLIN SODIUM-DEXTROSE 2-4 GM/100ML-% IV SOLN
2.0000 g | INTRAVENOUS | Status: DC
Start: 2022-02-13 — End: 2022-02-13
  Filled 2022-02-10: qty 100

## 2022-02-10 MED ORDER — LEVETIRACETAM IN NACL 1000 MG/100ML IV SOLN
1000.0000 mg | Freq: Once | INTRAVENOUS | Status: AC
Start: 2022-02-10 — End: 2022-02-10
  Administered 2022-02-10: 1000 mg via INTRAVENOUS
  Filled 2022-02-10: qty 100

## 2022-02-10 MED ORDER — FERROUS SULFATE 300 (60 FE) MG/5ML PO SYRP
220.0000 mg | ORAL_SOLUTION | Freq: Every day | ORAL | Status: DC
Start: 2022-02-10 — End: 2022-02-13
  Administered 2022-02-10 – 2022-02-13 (×4): 220 mg via ORAL
  Filled 2022-02-10 (×5): qty 5

## 2022-02-10 MED ORDER — ACETAMINOPHEN 650 MG RE SUPP: 650.0000 mg | RECTAL | Status: DC | PRN

## 2022-02-10 MED ORDER — SODIUM CHLORIDE 0.9 % IV SOLN: INTRAVENOUS | Status: AC

## 2022-02-10 MED ORDER — ACETAMINOPHEN 160 MG/5ML PO SOLN
650.0000 mg | ORAL | Status: DC | PRN
Start: 2022-02-10 — End: 2022-02-13

## 2022-02-10 MED ORDER — LABETALOL HCL 5 MG/ML IV SOLN
5.0000 mg | Freq: Once | INTRAVENOUS | Status: DC
Start: 2022-02-10 — End: 2022-02-13

## 2022-02-10 MED ORDER — LORAZEPAM 2 MG/ML IJ SOLN: 1.0000 mg | INTRAMUSCULAR | Status: DC | PRN

## 2022-02-10 MED ORDER — ACETAMINOPHEN 325 MG PO TABS
650.0000 mg | ORAL_TABLET | ORAL | Status: DC | PRN
Start: 2022-02-10 — End: 2022-02-13

## 2022-02-10 MED ORDER — SODIUM CHLORIDE 0.9 % IV SOLN: INTRAVENOUS | Status: DC

## 2022-02-10 MED ORDER — SENNOSIDES-DOCUSATE SODIUM 8.6-50 MG PO TABS
1.0000 | ORAL_TABLET | Freq: Every evening | ORAL | Status: DC | PRN
  Filled 2022-02-10: qty 1

## 2022-02-10 MED ORDER — EMPTY CONTAINERS FLEXIBLE MISC
1800.0000 mg | Freq: Once | Status: AC
  Administered 2022-02-10: 1800 mg via INTRAVENOUS
  Filled 2022-02-10: qty 180

## 2022-02-10 MED ORDER — LEVETIRACETAM IN NACL 500 MG/100ML IV SOLN
500.0000 mg | Freq: Two times a day (BID) | INTRAVENOUS | Status: DC
  Administered 2022-02-10 – 2022-02-13 (×6): 500 mg via INTRAVENOUS
  Filled 2022-02-10 (×6): qty 100

## 2022-02-10 MED ORDER — SODIUM CHLORIDE 0.9% FLUSH
3.0000 mL | Freq: Once | INTRAVENOUS | Status: AC
  Administered 2022-02-10: 3 mL via INTRAVENOUS

## 2022-02-10 MED ORDER — INSULIN ASPART 100 UNIT/ML IJ SOLN
0.0000 [IU] | Freq: Three times a day (TID) | INTRAMUSCULAR | Status: DC
  Administered 2022-02-11 (×2): 2 [IU] via SUBCUTANEOUS
  Administered 2022-02-12: 5 [IU] via SUBCUTANEOUS
  Administered 2022-02-12 – 2022-02-13 (×2): 1 [IU] via SUBCUTANEOUS

## 2022-02-10 MED ORDER — SIMVASTATIN 20 MG PO TABS
20.0000 mg | ORAL_TABLET | Freq: Every day | ORAL | Status: DC
  Administered 2022-02-10 – 2022-02-12 (×3): 20 mg via ORAL
  Filled 2022-02-10 (×3): qty 1

## 2022-02-10 MED ORDER — STROKE: EARLY STAGES OF RECOVERY BOOK
Freq: Once | Status: AC
  Filled 2022-02-10: qty 1

## 2022-02-10 NOTE — Hospital Course (Signed)
Fernando Rhodes is a 66 year old male with past medical history significant for type 2 diabetes mellitus, HTN, DVT/PE June 2021 on Eliquis, CKD stage IIIa, CVA who presented to [facility] ED on 3/3 via EMS after spouse noted seizure-like activity, left-sided facial droop and left-sided weakness.  Last known normal 2130 on 02/09/2022.  No reported tongue biting or bladder/bowel incontinence.  Patient was confused on arrival, likely due to postictal state.  He was noted to have persistent left facial droop.  He denied fever/chills, no nausea/vomiting/diarrhea, no abdominal pain, no chest pain, no shortness of breath, no palpitations, no urinary complaints. ? ?In the ED, temperature 97.3 ?F, HR 82, RR 18, BP 143/92, SPO2 89% on room air.  Sodium 135, potassium 3.4, chloride 103, CO2 16, glucose 200, BUN 15, creatinine 1.36, AST 24, ALT 15, total bilirubin 0.6.  WBC 7.9, hemoglobin 14.7, platelets 200.  COVID-19 PCR negative.  Influenza A/B PCR negative.  Hemoglobin A1c 5.7.  CT head without contrast with small focus of subarachnoid hyperdensity at the superior medial aspect of encephalomalacia concerning for subarachnoid hemorrhage; old right insular/opercular infarct.  Neurology was consulted.  EDP ordered Eliquis reversal, labetalol 5 mg IV, 1000 mg IV Keppra loading dose.  Hospitalist service consulted for further evaluation and management of acute subarachnoid hemorrhage with seizure. ? ?

## 2022-02-10 NOTE — Progress Notes (Signed)
EEG complete - results pending 

## 2022-02-10 NOTE — ED Notes (Signed)
Patient transported to CT 

## 2022-02-10 NOTE — Assessment & Plan Note (Addendum)
CT head on admission with small focus of subarachnoid hyperdensity superior medial aspect of the area of encephalomalacia favored to be small amount of subarachnoid hemorrhage.  Complicated by history of PE/DVT currently on Eliquis.  Patient received Eliquis reversal by EDP.  CTA head/neck with no dissection/aneurysm noted.  Repeat CT head and MRI brain with no findings of hemorrhage; suspect initial CT results artifact. ? ? ? ?

## 2022-02-10 NOTE — Assessment & Plan Note (Addendum)
Patient with past history of saddle pulmonary embolism and left lower extremity DVT in June 2021.  Has been on Eliquis since.  Eliquis has been reversed by EDP for concerns of subarachnoid hemorrhage as above. 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 Neurology recommended change from Eliquis to Pradaxa 150mg  PO BID for likely treatment failure with Eliquis with continued left lower extremity DVT. ?

## 2022-02-10 NOTE — Progress Notes (Signed)
Received patient from ED via bed.  Patient is alert and oriented x 4, with signs of forgetfulness.  Wife at the bedside.  Patient denies pain at this time.  Noted with minimal evidence of left facial weakness.  LUE and LLE weakness noted.  Oriented to room and unit routine, call bell within reach.  Needs addressed. ?

## 2022-02-10 NOTE — Procedures (Signed)
Patient Name: Fernando Rhodes  ?MRN: [id number]  ?Epilepsy Attending: Lora Havens  ?Referring Physician/Provider: Gwinda Maine, MD ?Date: 02/10/2022 ?Duration: 25.24 mins ? ?Patient history:  66 y.o. male PMHx DVT and PE on apixaban woke with seizure found to have [place]. EEG to evaluate for seizure ? ?Level of alertness: Awake ? ?AEDs during EEG study: LEV ? ?Technical aspects: This EEG study was done with scalp electrodes positioned according to the 10-20 International system of electrode placement. Electrical activity was acquired at a sampling rate of 500Hz  and reviewed with a high frequency filter of 70Hz  and a low frequency filter of 1Hz . EEG data were recorded continuously and digitally stored.  ? ?Description: The posterior dominant rhythm consists of 9-10 Hz activity of moderate voltage (25-35 uV) seen predominantly in posterior head regions, symmetric and reactive to eye opening and eye closing. Physiologic photic driving was seen during photic stimulation.  Hyperventilation was not performed.    ? ?IMPRESSION: ?This study is within normal limits. No seizures or epileptiform discharges were seen throughout the recording. ? ?Lora Havens  ? ?

## 2022-02-10 NOTE — Assessment & Plan Note (Addendum)
Patient presenting to ED following seizure activity with left-sided facial droop which is new.  CT head with small focus of subarachnoid hyperdensity superior medial aspect of the area of encephalomalacia favored to be a small amount of subarachnoid hemorrhage, old right insular/opercular infarct.  CTA head/neck with no emergent large vessel occlusion or high-grade stenosis of the intracranial arteries, no dissection/aneurysm or hemodynamically significant stenosis of the carotid/vertebral arteries.  Repeat CT head stable appearance of right MCA infarct and encephalomalacia.  MR brain without contrast 3/3 with small acute left frontal cortical/subcortical infarct and bilateral punctate cerebellar acute infarcts with no hemorrhage or mass effect; old right frontal infarct with findings of chronic small vessel disease.  TTE with LVEF 60 to 65%, no regional wall motion abnormalities, mild MR, no aortic stenosis, IVC normal in size, bubble study attempted but nondiagnostic due to poor echo window.  Hemoglobin A1c 5.7.  LDL 71.  Home Eliquis was discontinued in favor of Pradaxa 150 mg p.o. twice daily as this was thought to be a treatment failure by neurology.  Continue simvastatin 20 mg p.o. daily.  Outpatient follow-up with neurology 4 weeks. ? ? ?

## 2022-02-10 NOTE — Evaluation (Signed)
Speech Language Pathology Evaluation ?Patient Details ?Name: Fernando Rhodes ?MRN: [id number] ?DOB: 1956/07/28 ?Today's Date: 02/10/2022 ?Time: 5852-7782 ?SLP Time Calculation (min) (ACUTE ONLY): 18 min ? ?Problem List:  ?Patient Active Problem List  ? Diagnosis Date Noted  ? Acute CVA (cerebrovascular accident) ([place]) 02/10/2022  ? Subarachnoid hemorrhage 02/10/2022  ? Seizure ([place]) 02/10/2022  ? Dyslipidemia 02/10/2022  ? Type 2 diabetes mellitus with chronic kidney disease, without long-term current use of insulin ([place]) 02/10/2022  ? Gout 02/10/2022  ? VTE (venous thromboembolism) 06/30/2020  ? Pulmonary embolus ([place]) 06/01/2020  ? Diverticulitis 06/01/2020  ? Renal insufficiency 06/01/2020  ? Liver abscess 06/01/2020  ? Diabetes ([place]) 06/01/2020  ? History of gout 06/01/2020  ? Atherosclerotic peripheral vascular disease ([place]) 06/01/2020  ? Septic shock ([place]) 05/28/2020  ? ?Past Medical History:  ?Past Medical History:  ?Diagnosis Date  ? Diabetes mellitus without complication ([place])   ? Hypertension   ? Renal insufficiency 06/01/2020  ? ?Past Surgical History:  ?Past Surgical History:  ?Procedure Laterality Date  ? ABDOMINAL SURGERY    ? IR US GUIDE BX ASP/DRAIN  06/01/2020  ? ?HPI:  ?Pt is a 66 y.o. who presented to ED with acute onset of suspected seizure.  Pt's wife found him rigid and stiff with left facial droop and shaking all over with froth coming out of his mouth and his eyes rolled back. He was unresponsive and confused with suspected postictal phase his seizures were aborted. CT head (02/10/22) revealed "No acute infarct present. 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 MRI pending. Failed [facility] 02/10/22. 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  ? ?Assessment / Plan / Recommendation ?Clinical Impression ? Pt with reported hx of  chronic congential speech/language deficits per sister and wife who were present for evaluation. Speech is clear, although is intermittently frequented by word finding and phonemic paraphasias/neologisms. Family reports that this is baseline for pt. He is able to follow 1-2 step directions and answer simple-moderate yes/no questions with 100% accuracy. Errors noted with complexity during both tasks. He is oriented to self and place fully, requiring choice cues to identify concepts related to time. He was not able to tell clinicain reason for coming to the ED. Immediate memory of listed items was poor and reduced sustained attention also appeared to impact performance during this task. He immediately recalled 1/5 words after initial repetition and 2/5 after multiple trials. Family reports that this is different from baseline. SLP services recommended to f/u for treatment of cognitive functions only at this time as speech/language appears at baseline per family. ?   ?SLP Assessment ? SLP Recommendation/Assessment: Patient needs continued [facility] ?SLP Visit Diagnosis: Cognitive communication deficit (R41.841)  ?  ?Recommendations for follow up therapy are one component of a multi-disciplinary discharge planning process, led by the attending physician.  Recommendations may be updated based on patient status, additional functional criteria and insurance authorization. ?   ?Follow Up Recommendations ? Other (comment) (TBD)  ?  ?Assistance Recommended at Discharge ? Intermittent Supervision/Assistance  ?Functional Status Assessment Patient has had a recent decline in their functional status and demonstrates the ability to make significant improvements in function in a reasonable and predictable amount of time.  ?Frequency and Duration min 2x/week  ?2 weeks ?  ?   ?SLP Evaluation ?Cognition ? Overall Cognitive Status:  Impaired/Different from baseline ?Arousal/Alertness: Awake/alert ?Orientation  Level: Oriented to person;Oriented to place;Disoriented to time;Disoriented to situation ?Year: Other (Comment) (2003) ?Month:  (pt not sure) ?Day of Week: Incorrect ?Attention: Sustained ?Sustained Attention: Impaired ?Sustained Attention Impairment: Verbal basic ?Memory: Impaired ?Memory Impairment: Storage deficit;Retrieval deficit;Decreased recall of new information;Decreased short term memory ?Decreased Short Term Memory: Verbal basic ?Immediate Memory Recall:  (SLUMS: 1/5, then 2/5 with multiple repetitions) ?Awareness: Impaired ?Awareness Impairment: Intellectual impairment ?Problem Solving: Impaired ?Problem Solving Impairment: Verbal basic;Functional basic ?Executive Function: Self Monitoring;Self Correcting ?Self Monitoring: Impaired ?Self Monitoring Impairment: Verbal basic ?Self Correcting: Impaired ?Self Correcting Impairment: Verbal basic  ?  ?   ?Comprehension ? Auditory Comprehension ?Overall Auditory Comprehension: Other (comment) (difficult to determine if different than baseline) ?Yes/No Questions: Impaired ?Basic Biographical Questions: 76-100% accurate ?Basic Immediate Environment Questions: 75-100% accurate ?Complex Questions: 75-100% accurate ?Commands: Impaired ?One Step Basic Commands: 75-100% accurate ?Two Step Basic Commands: 75-100% accurate ?Complex Commands: 25-49% accurate ?Conversation: Simple ?Interfering Components: Attention;Processing speed ?EffectiveTechniques: Extra processing time;Repetition ?Visual Recognition/Discrimination ?Discrimination: Not tested ?Reading Comprehension ?Reading Status: Not tested  ?  ?Expression Expression ?Primary Mode of Expression: Verbal ?Verbal Expression ?Overall Verbal Expression: Impaired at baseline ?Initiation: No impairment ?Automatic Speech: Name ?Level of Generative/Spontaneous Verbalization: Sentence ?Repetition: Impaired ?Level of Impairment: Word level ?Naming: Impairment ?Confrontation: Impaired ?Convergent: 75-100% accurate ?Verbal  Errors: Phonemic paraphasias;Neologisms;Not aware of errors ?Effective Techniques: Phonemic cues;Semantic cues ?Written Expression ?Written Expression: Not tested   ?Oral / Motor ? Oral Motor/Sensory Function ?Overall Oral Motor/Sensory Function: Mild impairment ?Facial ROM: Within Functional Limits ?Facial Symmetry: Abnormal symmetry left ?Lingual ROM: Within Functional Limits ?Lingual Symmetry: Within Functional Limits ?[place]: Within Functional Limits ?Motor Speech ?Overall Motor Speech: Appears within functional limits for tasks assessed   ?        ? ? ? ?Ellwood Dense, MA, CCC-SLP ?Acute Rehabilitation Services ?Office Number: [phone] ? ? ?[name] ?02/10/2022, 10:06 AM ? ? ?

## 2022-02-10 NOTE — ED Provider Notes (Signed)
[facility] EMERGENCY DEPARTMENT Provider Note   CSN: [id number] Arrival date & time: 02/10/22  8250  An emergency department physician performed an initial assessment on this suspected stroke patient at [facility].  History  Chief Complaint  Patient presents with   Code Stroke    Fernando Rhodes is a 66 y.o. male.  Patient presents to the emergency department for evaluation of possible stroke.  Patient presents by ambulance from home.  Patient's wife called 911 because she says he woke up 30 minutes ago and was very altered.  He apparently had an unresponsive episode where he was very rigid and since then he has been confused, somnolent.  Wife reportsleft facial droop as well as difficulty moving the left side of his body.      Home Medications Prior to Admission medications   Medication Sig Start Date End Date Taking? Authorizing Provider  acetaminophen (TYLENOL) 500 MG tablet Take 1,000 mg by mouth every 6 (six) hours as needed for headache (pain).   Yes [provider]  ELIQUIS 5 MG TABS tablet Take 5 mg by mouth 2 (two) times daily. 12/12/21  Yes [provider]  FERROUS SULFATE PO Take 1 tablet by mouth daily. Unsure of strength   Yes [provider]  metFORMIN (GLUCOPHAGE) 500 MG tablet Take 1 tablet (500 mg total) by mouth 2 (two) times daily with a meal. 06/04/20 02/10/22 Yes Gonfa, Charlesetta Ivory, MD  Multiple Vitamins-Minerals (MULTIVITAMIN GUMMIES ADULT PO) Take 2 tablets by mouth daily. Take 2 gummies daily   Yes [provider]  simvastatin (ZOCOR) 20 MG tablet Take 20 mg by mouth at bedtime.    Yes [provider]  allopurinol (ZYLOPRIM) 100 MG tablet Take 1 tablet (100 mg total) by mouth daily. Patient not taking: Reported on 07/27/2020 05/25/20   Newt Minion, MD  APIXABAN Arne Cleveland) VTE STARTER PACK (10MG  AND 5MG ) Take as directed on package: start with two-5mg  tablets twice daily for 7 days. On day 8, switch to one-5mg   tablet twice daily. Patient not taking: Reported on 02/10/2022 06/04/20   Mercy Riding, MD  lisinopril-hydrochlorothiazide (PRINZIDE,ZESTORETIC) 20-12.5 MG per tablet TAKE ONE TABLET BY MOUTH EVERY DAY Patient not taking: No sig reported 07/30/11   Denita Lung, MD      Allergies    Patient has no known allergies.    Review of Systems   Review of Systems  Neurological:  Positive for facial asymmetry and weakness.  Psychiatric/Behavioral:  Positive for confusion.    Physical Exam Updated Vital Signs BP (!) 139/92    Pulse 70    Temp (!) 97.3 F (36.3 C) (Oral)    Resp 13    Wt 76.6 kg    SpO2 97%    BMI 27.26 kg/m  Physical Exam Vitals and nursing note reviewed.  Constitutional:      General: He is not in acute distress.    Appearance: He is well-developed.  HENT:     Head: Normocephalic and atraumatic.     Mouth/Throat:     Mouth: Mucous membranes are moist.  Eyes:     General: Vision grossly intact. Gaze aligned appropriately.     Extraocular Movements: Extraocular movements intact.     Conjunctiva/sclera: Conjunctivae normal.  Cardiovascular:     Rate and Rhythm: Normal rate and regular rhythm.     Pulses: Normal pulses.     Heart sounds: Normal heart sounds, S1 normal and S2 normal. No murmur heard.  No friction rub. No gallop.  Pulmonary:     Effort: Pulmonary effort is normal. No respiratory distress.     Breath sounds: Normal breath sounds.  Abdominal:     Palpations: Abdomen is soft.     Tenderness: There is no abdominal tenderness. There is no guarding or rebound.     Hernia: No hernia is present.  Musculoskeletal:        General: No swelling.     Cervical back: Full passive range of motion without pain, normal range of motion and neck supple. No pain with movement, spinous process tenderness or muscular tenderness. Normal range of motion.     Right lower leg: No edema.     Left lower leg: No edema.  Skin:    General: Skin is warm and dry.     Capillary  Refill: Capillary refill takes less than 2 seconds.     Findings: No ecchymosis, erythema, lesion or wound.  Neurological:     Mental Status: He is oriented to person, place, and time. He is lethargic.     GCS: GCS eye subscore is 4. GCS verbal subscore is 5. GCS motor subscore is 6.     Cranial Nerves: Dysarthria and facial asymmetry (Left facial droop) present.     Motor: Weakness (Left arm and leg) present. No abnormal muscle tone.     Coordination: Coordination abnormal (Left arm and leg).    ED Results / Procedures / Treatments   Labs (all labs ordered are listed, but only abnormal results are displayed) Labs Reviewed  COMPREHENSIVE METABOLIC PANEL - Abnormal; Notable for the following components:      Result Value   Potassium 3.4 (*)    CO2 16 (*)    Glucose, Bld 200 (*)    Creatinine, Ser 1.36 (*)    Calcium 8.6 (*)    Total Protein 6.4 (*)    GFR, Estimated 58 (*)    Anion gap 16 (*)    All other components within normal limits  I-STAT CHEM 8, ED - Abnormal; Notable for the following components:   Creatinine, Ser 1.40 (*)    Glucose, Bld 188 (*)    Calcium, Ion 0.98 (*)    TCO2 20 (*)    All other components within normal limits  CBG MONITORING, ED - Abnormal; Notable for the following components:   Glucose-Capillary 191 (*)    All other components within normal limits  RESP PANEL BY RT-PCR (FLU A&B, COVID) ARPGX2  PROTIME-INR  APTT  CBC  DIFFERENTIAL    EKG EKG Interpretation  Date/Time:  Friday February 10 2022 02:15:40 EST Ventricular Rate:  76 PR Interval:  171 QRS Duration: 97 QT Interval:  404 QTC Calculation: 455 R Axis:   77 Text Interpretation: Sinus rhythm Consider left atrial enlargement Abnormal R-wave progression, early transition Borderline T wave abnormalities 12 Lead; Mason-L[place]ar Confirmed by Orpah Greek [phone]) on 02/10/2022 5:00:37 AM  Radiology CT HEAD CODE STROKE WO CONTRAST  Result Date: 02/10/2022 CLINICAL DATA:  Code stroke.  EXAM: CT HEAD WITHOUT CONTRAST TECHNIQUE: Contiguous axial images were obtained from the base of the skull through the vertex without intravenous contrast. RADIATION DOSE REDUCTION: This exam was performed according to the departmental dose-optimization program which includes automated exposure control, adjustment of the mA and/or kV according to patient size and/or use of iterative reconstruction technique. COMPARISON:  None. 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 At the superomedial aspect of the area of encephalomalacia, there  is a small focus of subarachnoid hyperdensity. No mass effect. Vascular: No abnormal hyperdensity of the major intracranial arteries or dural venous sinuses. No intracranial atherosclerosis. Skull: The visualized skull base, calvarium and extracranial soft tissues are normal. Sinuses/Orbits: No fluid levels or advanced mucosal thickening of the visualized paranasal sinuses. No mastoid or middle ear effusion. The orbits are normal. IMPRESSION: 1. Small focus of subarachnoid hyperdensity at the superomedial aspect of the area of encephalomalacia, favored to be a small amount of subarachnoid hemorrhage. 2. 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 Critical Value/emergent results were called by telephone at the time of interpretation on 02/10/2022 at 1:54 am to provider Lynnae Sandhoff, who verbally acknowledged these results. Electronically Signed   By: Ulyses Jarred M.D.   On: 02/10/2022 01:59   CT ANGIO HEAD NECK W WO CM (CODE STROKE)  Result Date: 02/10/2022 CLINICAL DATA:  Stroke follow-up EXAM: CT ANGIOGRAPHY HEAD AND NECK TECHNIQUE: Multidetector CT imaging of the head and neck was performed using the standard protocol during bolus administration of intravenous contrast. Multiplanar CT image reconstructions and MIPs were obtained to evaluate the vascular anatomy. Carotid stenosis measurements (when applicable) are obtained utilizing NASCET criteria, using the distal  internal carotid diameter as the denominator. RADIATION DOSE REDUCTION: This exam was performed according to the departmental dose-optimization program which includes automated exposure control, adjustment of the mA and/or kV according to patient size and/or use of iterative reconstruction technique. CONTRAST:  75mL OMNIPAQUE IOHEXOL 350 MG/ML SOLN COMPARISON:  None. FINDINGS: CTA NECK FINDINGS SKELETON: There is no bony spinal canal stenosis. No lytic or blastic lesion. OTHER NECK: Normal pharynx, larynx and major salivary glands. No cervical lymphadenopathy. Unremarkable thyroid gland. UPPER CHEST: No pneumothorax or pleural effusion. No nodules or masses. AORTIC ARCH: Below the field of view. The visualized proximal subclavian arteries are widely patent. RIGHT CAROTID SYSTEM: Normal without aneurysm, dissection or stenosis. LEFT CAROTID SYSTEM: Normal without aneurysm, dissection or stenosis. VERTEBRAL ARTERIES: Left dominant configuration. Both origins are clearly patent. There is no dissection, occlusion or flow-limiting stenosis to the skull base (V1-V3 segments). CTA HEAD FINDINGS POSTERIOR CIRCULATION: --Vertebral arteries: Normal V4 segments. --Inferior cerebellar arteries: Normal. --Basilar artery: Normal. --Superior cerebellar arteries: Normal. --Posterior cerebral arteries (PCA): Normal. ANTERIOR CIRCULATION: --Intracranial internal carotid arteries: Normal. --Anterior cerebral arteries (ACA): Normal. Both A1 segments are present. Patent anterior communicating artery (a-comm). --Middle cerebral arteries (MCA): Normal. VENOUS SINUSES: As permitted by contrast timing, patent. ANATOMIC VARIANTS: None Review of the MIP images confirms the above findings. IMPRESSION: 1. No emergent large vessel occlusion or high-grade stenosis of the intracranial arteries. 2. No dissection, aneurysm or hemodynamically significant stenosis of the carotid or vertebral arteries. Electronically Signed   By: Ulyses Jarred M.D.    On: 02/10/2022 03:10    Procedures Procedures    Medications Ordered in ED Medications  labetalol (NORMODYNE) injection 5 mg (0 mg Intravenous Hold 02/10/22 0409)  levETIRAcetam (KEPPRA) IVPB 500 mg/100 mL premix (has no administration in time range)  sodium chloride flush (NS) 0.9 % injection 3 mL (3 mLs Intravenous Given 02/10/22 0201)  levETIRAcetam (KEPPRA) IVPB 1000 mg/100 mL premix (0 mg Intravenous Stopped 02/10/22 0238)  coag fact Xa recombinant (ANDEXXA) high dose infusion 1800 mg (1,800 mg Intravenous New Bag/Given 02/10/22 0316)  iohexol (OMNIPAQUE) 350 MG/ML injection 75 mL (75 mLs Intravenous Contrast Given 02/10/22 0304)    ED Course/ Medical Decision Making/ A&P  Medical Decision Making Amount and/or Complexity of Data Reviewed Independent Historian: EMS External Data Reviewed: notes. Labs: ordered. Decision-making details documented in ED Course. Radiology: ordered and independent interpretation performed. Decision-making details documented in ED Course. ECG/medicine tests: ordered and independent interpretation performed. Decision-making details documented in ED Course.  Risk Prescription drug management.   66 year old male presents to the emergency department with altered mental status and left-sided weakness.  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  At presentation he is altered with left hemiparesis.  Differential diagnosis stroke, TIA, seizure with Todd's paralysis  CT scan performed at arrival does show small subarachnoid bleed.  As patient is anticoagulated and he has potential for significant worsening of bleeding, will order reversal agent.  Still suspect that there may have been a seizure because amount of bleeding on scan does not support the current exam.  Patient loaded with Keppra.  Patient will be admitted by hospitalist for further management.         CRITICAL  CARE Performed by: Orpah Greek   Total critical care time: 33 minutes  Critical care time was exclusive of separately billable procedures and treating other patients.  Critical care was necessary to treat or prevent imminent or life-threatening deterioration.  Critical care was time spent personally by me on the following activities: development of treatment plan with patient and/or surrogate as well as nursing, discussions with consultants, evaluation of patient's response to treatment, examination of patient, obtaining history from patient or surrogate, ordering and performing treatments and interventions, ordering and review of laboratory studies, ordering and review of radiographic studies, pulse oximetry and re-evaluation of patient's condition.   Final Clinical Impression(s) / ED Diagnoses Final diagnoses:  Seizure ([place])  SAH (subarachnoid hemorrhage) ([place])    Rx / DC Orders ED Discharge Orders     None         Chavonne Sforza, Gwenyth Allegra, MD 02/10/22 [phone]

## 2022-02-10 NOTE — Plan of Care (Addendum)
Please see progress note by stroke team. Note entered in error. ?

## 2022-02-10 NOTE — Evaluation (Signed)
Physical Therapy Evaluation ?Patient Details ?Name: Fernando Rhodes ?MRN: [id number] ?DOB: 04/04/1956 ?Today's Date: 02/10/2022 ? ?History of Present Illness ? Pt is a 66 y.o. who presented to [facility] on 3/3 with acute onset of suspected seizure.  Pt's wife found him rigid and stiff with left facial droop and shaking all over with froth coming out of his mouth and his eyes rolled back. He was unresponsive and confused with suspected postictal phase his seizures were aborted. 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 MRI pending. 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 Pt presents with chronic L UE/LE deficits and acute onset of suspected seizures. These impairments are limiting his ability to safely and independently transfer, get into his home, perform all adls/iadls, and ambulate in the community. Pt to benefit from acute PT to address deficits. Bed mobility, functional transfers, gait, and higher level balance was performed and pt ambulated 150 ft with no AD.  Pt. Responded well and reports that he is functioning similar to his baseline level. No follow up therapy recommended once d/c because of this. PT to follow acutely as further medical workup performed and to progress mobility as acutely able.   ?   ?   ? ?Recommendations for follow up therapy are one component of a multi-disciplinary discharge planning process, led by the attending physician.  Recommendations may be updated based on patient status, additional functional criteria and insurance authorization. ? ?Follow Up Recommendations No PT follow up ? ?  ?Assistance Recommended at Discharge PRN  ?Patient can return home with the following ? Assist for transportation;Assistance with cooking/housework ? ?   ?Equipment Recommendations None recommended by PT  ?Recommendations for Other Services ?    ?  ?Functional Status Assessment Patient has had a recent decline in their functional status and demonstrates the ability to make significant improvements in function in a reasonable and predictable amount of time.  ? ?  ?Precautions / Restrictions Restrictions ?Weight Bearing Restrictions: No  ? ?  ? ?Mobility ? Bed Mobility ?Overal bed mobility: Modified Independent ?  ?  ?  ?  ?  ?  ?  ?  ? ?Transfers ?Overall transfer level: Modified independent ?  ?  ?  ?  ?  ?  ?  ?  ?  ?  ? ?Ambulation/Gait ?Ambulation/Gait assistance: Supervision ?Gait Distance (Feet): 150 Feet ?Assistive device: None ?Gait Pattern/deviations: Step-through pattern, Decreased dorsiflexion - left ?Gait velocity: WNL ?  ?  ?General Gait Details: These deficits are from baseline, mild L impairments ? ?Stairs ?  ?  ?  ?  ?  ? ?Wheelchair Mobility ?  ? ?Modified Rankin (Stroke Patients Only) ?Modified Rankin (Stroke Patients Only) ?Pre-Morbid Rankin Score: No significant disability ?Modified Rankin: No significant disability ? ?  ? ?Balance Overall balance assessment: Modified Independent ?  ?Sitting balance-Leahy Scale: Good ?  ?  ?  ?Standing balance-Leahy Scale: Good ?  ?  ?  ?  ?  ?  ?  ?High level balance activites: Side stepping, Backward walking, Turns, Direction changes ?High Level Balance Comments: Pt. shows good control throughout gait and is able to tolerate moderate balance challenges ?  ?  ?  ?   ? ? ? ?Pertinent Vitals/Pain Pain Assessment ?Pain Assessment: No/denies pain  ? ? ?  Home Living Family/patient expects to be discharged to:: Private residence ?Living Arrangements: Spouse/significant other ?Available Help at Discharge: Family;Available PRN/intermittently ?Type of Home: House ?Home Access: Level entry ?  ?Entrance Stairs-Number of Steps: unsure ?  ?Home Layout: One level ?Home Equipment: None ?   ?  ?Prior Function Prior Level of  Function : Independent/Modified Independent ?  ?  ?  ?  ?  ?  ?Mobility Comments: Pt. able to walk independently ?ADLs Comments: all independent, worked at a car wash, drives ?  ? ? ?Hand Dominance  ? Dominant Hand: Right ? ?  ?Extremity/Trunk Assessment  ? Upper Extremity Assessment ?Upper Extremity Assessment: Defer to OT evaluation ?  ? ?Lower Extremity Assessment ?Lower Extremity Assessment: LLE deficits/detail ?LLE Deficits / Details: Chronic deficits at basline ?  ? ?Cervical / Trunk Assessment ?Cervical / Trunk Assessment: Normal  ?Communication  ? Communication: Expressive difficulties  ?Cognition Arousal/Alertness: Awake/alert ?Behavior During Therapy: [facility] for tasks assessed/performed ?Overall Cognitive Status: History of cognitive impairments - at baseline ?  ?  ?  ?  ?  ?  ?  ?  ?  ?  ?  ?  ?  ?  ?  ?  ?General Comments: Expressive difficulties at baseline ?  ?  ? ?  ?General Comments   ? ?  ?Exercises    ? ?Assessment/Plan  ?  ?PT Assessment Patient needs continued PT services  ?PT Problem List Decreased range of motion;Decreased coordination;Decreased activity tolerance;Decreased cognition;Decreased balance ? ?   ?  ?PT Treatment Interventions DME instruction;Therapeutic exercise;Balance training;Gait training;Stair training;Neuromuscular re-education;Functional mobility training;Cognitive remediation;Patient/family education;Therapeutic activities   ? 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 ?  ?Frequency Min 3X/week ?  ? ? ?Co-evaluation   ?  ?  ?  ?  ? ? ?  ?AM-PAC PT "6 Clicks" Mobility  ?Outcome Measure Help needed turning from your back to your side while in a flat bed without using bedrails?: None ?Help needed moving from lying on your back to sitting on the side of a flat bed  without using bedrails?: None ?Help needed moving to and from a bed to a chair (including a wheelchair)?: None ?Help needed standing up from a chair using your arms (e.g., wheelchair or bedside chair)?: None ?Help needed to walk in hospital room?: A Little ?Help needed climbing 3-5 steps with a railing? : A Little ?6 Click Score: 22 ? ?  ?End of Session   ?Activity Tolerance: Patient tolerated treatment well ?Patient left: in bed;with call bell/phone within reach;with bed alarm set ?Nurse Communication: Mobility status ?PT Visit Diagnosis: Other abnormalities of gait and mobility (R26.89);Ataxic gait (R26.0);Hemiplegia and hemiparesis ?Hemiplegia - Right/Left: Left ?Hemiplegia - dominant/non-dominant: Non-dominant ?Hemiplegia - caused by: Unspecified (From baseline) ?  ? ?Time: 4742-5956 ?PT Time Calculation (min) (ACUTE ONLY): 23 min ? ? ?Charges:   PT Evaluation ?$PT Eval Low Complexity: 1 Low ?  ?  ?   ? ? ?Thermon Leyland, SPT ?Acute Rehab Services ? ? ?Thermon Leyland ?02/10/2022, 4:15 PM ? ?

## 2022-02-10 NOTE — Assessment & Plan Note (Deleted)
-   We will continue allopurinol 

## 2022-02-10 NOTE — Assessment & Plan Note (Addendum)
Lipid panel total cholesterol 141, HDL 51, LDL 71, triglycerides 97. Continue home simvastatin 20 mg p.o. daily ?

## 2022-02-10 NOTE — Progress Notes (Addendum)
STROKE TEAM PROGRESS NOTE   INTERVAL HISTORY His sister Almyra Free) and BIL [name]) is at the bedside. Per family at baseline patient is mildly disoriented, unable to read or drive, mildly disoriented, with poor concentration and attention. 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 On evaluation, the only new change is L lip droop. Patient reported no memory of the seizure that brought him in.   Vitals:   02/10/22 0700 02/10/22 1000 02/10/22 1200 02/10/22 1300  BP: 130/85 (!) 157/96 (!) 147/98 (!) 149/98  Pulse: 65 63 69 66  Resp: 19 16 20 19   Temp:      TempSrc:      SpO2: 98% 100% 98% 96%  Weight:       CBC:  Recent Labs  Lab 02/10/22 0139 02/10/22 0148  WBC 7.9  --   NEUTROABS 3.7  --   HGB 14.7 13.9  HCT 42.8 41.0  MCV 95.7  --   PLT 200  --    Basic Metabolic Panel:  Recent Labs  Lab 02/10/22 0139 02/10/22 0148  NA 135 139  K 3.4* 3.5  CL 103 105  CO2 16*  --   GLUCOSE 200* 188*  BUN 15 18  CREATININE 1.36* 1.40*  CALCIUM 8.6*  --    Lipid Panel: No results for input(s): CHOL, TRIG, HDL, CHOLHDL, VLDL, LDLCALC in the last 168 hours.  HgbA1c:  Recent Labs  Lab 02/10/22 0139  HGBA1C 5.7*    Urine Drug Screen: No results for input(s): LABOPIA, COCAINSCRNUR, LABBENZ, AMPHETMU, THCU, LABBARB in the last 168 hours.  Alcohol Level No results for input(s): ETH in the last 168 hours.  IMAGING past 24 hours CT HEAD WO CONTRAST (5MM)  Result Date: 02/10/2022 CLINICAL DATA:  Subarachnoid hemorrhage. EXAM: CT HEAD WITHOUT CONTRAST TECHNIQUE: Contiguous axial images were obtained from the base of the skull through the vertex without intravenous contrast. RADIATION DOSE REDUCTION: This exam was performed according to the departmental dose-optimization program which includes automated exposure control, adjustment of the mA and/or kV according to  patient size and/or use of iterative reconstruction technique. COMPARISON:  CT head and CTA head and neck 02/10/2022 FINDINGS: Brain: The right MCA infarct and encephalomalacia is again noted. Focal hyperdensity along the superomedial aspect of the encephalomalacia is stable. The area is more sharply defined on this study. An additional linear hyperdensity is present along the posterior superior aspect of the encephalomalacia, also sharply defined. No new hyperdensities are present. No acute infarct is present. Basal ganglia are unchanged. Ex vacuo dilation of the right lateral ventricle is stable. A remote infarct in the posterior left operculum is stable. Left hemisphere is otherwise unremarkable. The brainstem and cerebellum are within normal limits. Vascular: No hyperdense vessel or unexpected calcification. Skull: Calvarium is intact. No focal lytic or blastic lesions are present. No significant extracranial soft tissue lesion is present. Sinuses/Orbits: The paranasal sinuses and mastoid air cells are clear. The globes and orbits are within normal limits. IMPRESSION: 1. 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 2. Focal hyperdensity along the superomedial aspect of the encephalomalacia is more sharply defined on this study. This likely represents calcification related to the prior infarcts. No definite blood products are seen. 3. Stable remote infarct of the posterior left operculum. Of note a similar linear calcification is present. 4. Follow-up head CT in 24-48 hours  could be used to confirm stability of these hyperdensities. Electronically Signed   By: San Morelle M.D.   On: 02/10/2022 08:30   EEG adult  Result Date: 02/10/2022 Lora Havens, MD     02/10/2022  9:57 AM Patient Name: Fernando Rhodes MRN: [id number] Epilepsy Attending: Lora Havens Referring Physician/Provider: Gwinda Maine, MD Date: 02/10/2022 Duration: 25.24 mins Patient history:  66 y.o. 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 EEG to evaluate for seizure Level of alertness: Awake AEDs during EEG study: LEV Technical aspects: This EEG study was done with scalp electrodes positioned according to the 10-20 International system of electrode placement. Electrical activity was acquired at a sampling rate of 500Hz  and reviewed with a high frequency filter of 70Hz  and a low frequency filter of 1Hz . EEG data were recorded continuously and digitally stored. Description: The posterior dominant rhythm consists of 9-10 Hz activity of moderate voltage (25-35 uV) seen predominantly in posterior head regions, symmetric and reactive to eye opening and eye closing. Physiologic photic driving was seen during photic stimulation.  Hyperventilation was not performed.   IMPRESSION: This study is within normal limits. No seizures or epileptiform discharges were seen throughout the recording. Lora Havens   ECHOCARDIOGRAM COMPLETE BUBBLE STUDY  Result Date: 02/10/2022    ECHOCARDIOGRAM REPORT   Patient Name:   Fernando Rhodes Date of Exam: 02/10/2022 Medical Rec #:  [id number]            Height:       66.0 in Accession #:    [id number]           Weight:       168.9 lb Date of Birth:  26-Jan-1956            BSA:          1.861 m Patient Age:    66 years             BP:           130/85 mmHg Patient Gender: M                    HR:           61 bpm. Exam Location:  Inpatient Procedure: 2D Echo, Cardiac Doppler, Color Doppler and Saline Contrast Bubble            Study Indications:    CVA  History:        Patient has prior history of Echocardiogram examinations, most                 recent 05/31/2020. CHF; Risk Factors:Hypertension and Diabetes.  Sonographer:    Luisa Hart RDCS Referring Phys: [id number] JAN A MANSY  Sonographer Comments: Suboptimal apical window and suboptimal subcostal window. Image acquisition challenging due to patient body habitus. IMPRESSIONS  1. Left ventricular ejection  fraction, by estimation, is 60 to 65%. The left ventricle has normal function. The left ventricle has no regional wall motion abnormalities. There is moderate left ventricular hypertrophy. Left ventricular diastolic parameters were normal.  2. Right ventricular systolic function is normal. The right ventricular size is normal.  3. Bubble study attempted but not diagnostic due to poor echo window.  4. The pericardial effusion is posterior to the left ventricle.  5. The mitral valve is abnormal. Mild mitral valve regurgitation. No evidence of mitral stenosis.  6. The aortic valve is tricuspid. There is  mild calcification of the aortic valve. Aortic valve regurgitation is not visualized. Aortic valve sclerosis is present, with no evidence of aortic valve stenosis.  7. The inferior vena cava is normal in size with greater than 50% respiratory variability, suggesting right atrial pressure of 3 mmHg. FINDINGS  Left Ventricle: Left ventricular ejection fraction, by estimation, is 60 to 65%. The left ventricle has normal function. The left ventricle has no regional wall motion abnormalities. The left ventricular internal cavity size was normal in size. There is  moderate left ventricular hypertrophy. Left ventricular diastolic parameters were normal. Right Ventricle: The right ventricular size is normal. No increase in right ventricular wall thickness. Right ventricular systolic function is normal. Left Atrium: Left atrial size was normal in size. Right Atrium: Right atrial size was normal in size. Pericardium: Trivial pericardial effusion is present. The pericardial effusion is posterior to the left ventricle. Mitral Valve: The mitral valve is abnormal. There is moderate thickening of the mitral valve leaflet(s). There is mild calcification of the mitral valve leaflet(s). Mild mitral valve regurgitation. No evidence of mitral valve stenosis. Tricuspid Valve: The tricuspid valve is normal in structure. Tricuspid valve  regurgitation is trivial. No evidence of tricuspid stenosis. Aortic Valve: The aortic valve is tricuspid. There is mild calcification of the aortic valve. Aortic valve regurgitation is not visualized. Aortic valve sclerosis is present, with no evidence of aortic valve stenosis. Aortic valve mean gradient measures 2.0 mmHg. Aortic valve peak gradient measures 3.0 mmHg. Aortic valve area, by VTI measures 3.50 cm. Pulmonic Valve: The pulmonic valve was normal in structure. Pulmonic valve regurgitation is not visualized. No evidence of pulmonic stenosis. Aorta: The aortic root is normal in size and structure. Venous: The inferior vena cava is normal in size with greater than 50% respiratory variability, suggesting right atrial pressure of 3 mmHg. IAS/Shunts: The interatrial septum was not well visualized. Agitated saline contrast was given intravenously to evaluate for intracardiac shunting.  LEFT VENTRICLE PLAX 2D LVIDd:         4.10 cm     Diastology LVIDs:         2.40 cm     LV e' medial:    4.80 cm/s LV PW:         1.10 cm     LV E/e' medial:  11.5 LV IVS:        1.50 cm     LV e' lateral:   8.03 cm/s LVOT diam:     2.20 cm     LV E/e' lateral: 6.9 LV SV:         67 LV SV Index:   36 LVOT Area:     3.80 cm  LV Volumes (MOD) LV vol d, MOD A2C: 62.4 ml LV vol d, MOD A4C: 59.0 ml LV vol s, MOD A2C: 27.6 ml LV vol s, MOD A4C: 20.3 ml LV SV MOD A2C:     34.8 ml LV SV MOD A4C:     59.0 ml LV SV MOD BP:      40.7 ml RIGHT VENTRICLE RV Basal diam:  2.90 cm RV Mid diam:    2.10 cm LEFT ATRIUM             Index        RIGHT ATRIUM           Index LA diam:        3.20 cm 1.72 cm/m   RA Area:     16.00 cm LA  Vol [facility]):   34.0 ml 18.27 ml/m  RA Volume:   40.90 ml  21.98 ml/m LA Vol (A4C):   46.9 ml 25.20 ml/m LA Biplane Vol: 40.8 ml 21.92 ml/m  AORTIC VALVE                    PULMONIC VALVE AV Area (Vmax):    3.55 cm     PV Vmax:       0.61 m/s AV Area (Vmean):   3.36 cm     PV Vmean:      46.700 cm/s AV Area (VTI):      3.50 cm     PV VTI:        0.164 m AV Vmax:           86.30 cm/s   PV Peak grad:  1.5 mmHg AV Vmean:          58.600 cm/s  PV Mean grad:  1.0 mmHg AV VTI:            0.191 m AV Peak Grad:      3.0 mmHg AV Mean Grad:      2.0 mmHg LVOT Vmax:         80.60 cm/s LVOT Vmean:        51.800 cm/s LVOT VTI:          0.176 m LVOT/AV VTI ratio: 0.92  AORTA Ao Root diam: 2.90 cm Ao Asc diam:  3.20 cm MITRAL VALVE MV Area (PHT): 3.91 cm    SHUNTS MV Decel Time: 194 msec    Systemic VTI:  0.18 m MV E velocity: 55.20 cm/s  Systemic Diam: 2.20 cm MV A velocity: 70.60 cm/s MV E/A ratio:  0.78 Jenkins Rouge MD Electronically signed by Jenkins Rouge MD Signature Date/Time: 02/10/2022/10:56:35 AM    Final    CT HEAD CODE STROKE WO CONTRAST  Result Date: 02/10/2022 CLINICAL DATA:  Code stroke. EXAM: CT HEAD WITHOUT CONTRAST TECHNIQUE: Contiguous axial images were obtained from the base of the skull through the vertex without intravenous contrast. RADIATION DOSE REDUCTION: This exam was performed according to the departmental dose-optimization program which includes automated exposure control, adjustment of the mA and/or kV according to patient size and/or use of iterative reconstruction technique. COMPARISON:  None. 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 At the superomedial aspect of the area of encephalomalacia, there is a small focus of subarachnoid hyperdensity. No mass effect. Vascular: No abnormal hyperdensity of the major intracranial arteries or dural venous sinuses. No intracranial atherosclerosis. Skull: The visualized skull base, calvarium and extracranial soft tissues are normal. Sinuses/Orbits: No fluid levels or advanced mucosal thickening of the visualized paranasal sinuses. No mastoid or middle ear effusion. The orbits are normal. IMPRESSION: 1. Small focus of subarachnoid hyperdensity at the superomedial aspect of the area of encephalomalacia, favored to be a small amount of subarachnoid  hemorrhage. 2. 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 Critical Value/emergent results were called by telephone at the time of interpretation on 02/10/2022 at 1:54 am to provider Lynnae Sandhoff, who verbally acknowledged these results. Electronically Signed   By: Ulyses Jarred M.D.   On: 02/10/2022 01:59   VAS [place] LOWER EXTREMITY VENOUS (DVT)  Result Date: 02/10/2022  Lower Venous DVT Study Patient Name:  Fernando Rhodes  Date of Exam:   02/10/2022 Medical Rec #: [id number]             Accession #:    [id number] Date of Birth:  Jul 04, 1956             Patient Gender: M Patient Age:   45 years Exam Location:  [facility] Procedure:      VAS [place] LOWER EXTREMITY VENOUS (DVT) Referring Phys: ERIC [place] --------------------------------------------------------------------------------  Indications: History of left lower extremity DVT,.  Anticoagulation: Previously on Eliquis, now with SAH. Comparison Study: 06-08-2020 Prior bilateral lower extremity venous was positive                   for acute DVT involving the left lower extremity. Performing Technologist: Darlin Coco RDMS, RVT  Examination Guidelines: A complete evaluation includes B-mode imaging, spectral Doppler, color Doppler, and power Doppler as needed of all accessible portions of each vessel. Bilateral testing is considered an integral part of a complete examination. Limited examinations for reoccurring indications may be performed as noted. The reflux portion of the exam is performed with the patient in reverse Trendelenburg.  +---------+---------------+---------+-----------+----------+--------------+  RIGHT     Compressibility Phasicity Spontaneity Properties Thrombus Aging  +---------+---------------+---------+-----------+----------+--------------+  CFV       Full            Yes       Yes                                    +---------+---------------+---------+-----------+----------+--------------+  SFJ       Full                                                              +---------+---------------+---------+-----------+----------+--------------+  FV Prox   Full                                                             +---------+---------------+---------+-----------+----------+--------------+  FV Mid    Full                                                             +---------+---------------+---------+-----------+----------+--------------+  FV Distal Full                                                             +---------+---------------+---------+-----------+----------+--------------+  PFV       Full                                                             +---------+---------------+---------+-----------+----------+--------------+  POP       Full                                                             +---------+---------------+---------+-----------+----------+--------------+  PTV       Full                                                             +---------+---------------+---------+-----------+----------+--------------+  PERO      Full                                                             +---------+---------------+---------+-----------+----------+--------------+  Gastroc   Full                                                             +---------+---------------+---------+-----------+----------+--------------+   +---------+---------------+---------+-----------+----------+--------------+  LEFT      Compressibility Phasicity Spontaneity Properties Thrombus Aging  +---------+---------------+---------+-----------+----------+--------------+  CFV       Full            Yes       Yes                                    +---------+---------------+---------+-----------+----------+--------------+  SFJ       Full                                                             +---------+---------------+---------+-----------+----------+--------------+  FV Prox   None            No        No                     Chronic          +---------+---------------+---------+-----------+----------+--------------+  FV Mid    None            No        No                     Chronic         +---------+---------------+---------+-----------+----------+--------------+  FV Distal None            No        No                     Chronic         +---------+---------------+---------+-----------+----------+--------------+  PFV       Full            Yes       Yes                                    +---------+---------------+---------+-----------+----------+--------------+  POP       Partial  Yes       Yes                    Chronic         +---------+---------------+---------+-----------+----------+--------------+  PTV       Full                                                             +---------+---------------+---------+-----------+----------+--------------+  PERO      Full                                                             +---------+---------------+---------+-----------+----------+--------------+  Gastroc   Full                                                             +---------+---------------+---------+-----------+----------+--------------+    Summary: RIGHT: - There is no evidence of deep vein thrombosis in the lower extremity.  - No cystic structure found in the popliteal fossa.  LEFT: - Findings consistent with chronic deep vein thrombosis involving the left femoral vein, and left popliteal vein. - No cystic structure found in the popliteal fossa.  *See table(s) above for measurements and observations.    Preliminary    CT ANGIO HEAD NECK W WO CM (CODE STROKE)  Result Date: 02/10/2022 CLINICAL DATA:  Stroke follow-up EXAM: CT ANGIOGRAPHY HEAD AND NECK TECHNIQUE: Multidetector CT imaging of the head and neck was performed using the standard protocol during bolus administration of intravenous contrast. Multiplanar CT image reconstructions and MIPs were obtained to evaluate the vascular anatomy. Carotid stenosis measurements (when  applicable) are obtained utilizing NASCET criteria, using the distal internal carotid diameter as the denominator. RADIATION DOSE REDUCTION: This exam was performed according to the departmental dose-optimization program which includes automated exposure control, adjustment of the mA and/or kV according to patient size and/or use of iterative reconstruction technique. CONTRAST:  49mL OMNIPAQUE IOHEXOL 350 MG/ML SOLN COMPARISON:  None. FINDINGS: CTA NECK FINDINGS SKELETON: There is no bony spinal canal stenosis. No lytic or blastic lesion. OTHER NECK: Normal pharynx, larynx and major salivary glands. No cervical lymphadenopathy. Unremarkable thyroid gland. UPPER CHEST: No pneumothorax or pleural effusion. No nodules or masses. AORTIC ARCH: Below the field of view. The visualized proximal subclavian arteries are widely patent. RIGHT CAROTID SYSTEM: Normal without aneurysm, dissection or stenosis. LEFT CAROTID SYSTEM: Normal without aneurysm, dissection or stenosis. VERTEBRAL ARTERIES: Left dominant configuration. Both origins are clearly patent. There is no dissection, occlusion or flow-limiting stenosis to the skull base (V1-V3 segments). CTA HEAD FINDINGS POSTERIOR CIRCULATION: --Vertebral arteries: Normal V4 segments. --Inferior cerebellar arteries: Normal. --Basilar artery: Normal. --Superior cerebellar arteries: Normal. --Posterior cerebral arteries (PCA): Normal. ANTERIOR CIRCULATION: --Intracranial internal carotid arteries: Normal. --Anterior cerebral arteries (ACA): Normal. Both A1 segments are present. Patent anterior communicating artery (a-comm). --Middle cerebral arteries (MCA): Normal. VENOUS SINUSES: As permitted by contrast timing, patent. ANATOMIC VARIANTS: None  Review of the MIP images confirms the above findings. IMPRESSION: 1. No emergent large vessel occlusion or high-grade stenosis of the intracranial arteries. 2. No dissection, aneurysm or hemodynamically significant stenosis of the carotid or  vertebral arteries. Electronically Signed   By: Ulyses Jarred M.D.   On: 02/10/2022 03:10    PHYSICAL EXAM Mental Status: Patient is awake, alert, oriented to person, place only. Stated Jan 2003 Patient is not able to give a clear and coherent history. Speech fluent, intact comprehension and repetition. No signs of aphasia or neglect. Visual Fields are full. Pupils are equal, round, and reactive to light. EOMI without ptosis or diplopia.  Facial sensation is symmetric to temperature Left facial droop.  Hearing is intact to voice. Uvula midline and palate elevates symmetrically. Shoulder shrug is symmetric. Tongue is midline without atrophy or fasciculations.  Mild LLE muscular atrophy with 4/5 LLE strength (chronic since childhood). Other 3 extremities 5/5 strength.  Sensation is symmetric to light touch and temperature in the arms and legs. Toes are mute. FNF and HKS are intact bilaterally. Gait - Deferred  ASSESSMENT/PLAN Fernando Rhodes is a 66 y.o. 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 He presented with left facial droop and left sided weakness especially in left hand. Wife noted generalized shaking and snoring like breathing, likely a seizure. He takes Eliquis for history of blood clot and wife says they make sure he is compliant. Patient was found to have [place] and likely first time seizure, loaded with levetiracetam 1,000mg  then continued on maintenance 500 mg BID. Routine EEG wnl. Left sided weakness is chronic since childhood, confirmed by sister, but L facial droop is new.   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 Old right insular/opercular  infarct. CTA head & neck: No emergent large vessel occlusion or high-grade stenosis of the intracranial arteries. No dissection, aneurysm or hemodynamically significant stenosis of the carotid or vertebral arteries. Venous sinuses as permitted by contrast timing, patent. Repeat CT head: Stable appearance of right MCA infarct and encephalomalacia.Focal hyperdensity along the superomedial aspect of the encephalomalacia is more sharply defined on this study. This likely represents calcification related to the prior infarcts. No definite blood products are seen. Stable remote infarct of the posterior left operculum. Of note a similar linear calcification is present. Follow-up head CT in 24-48 hours could be used to confirm stability of these hyperdensities. Routine EEG: Within normal limits. No seizures or epileptiform discharges were seen throughout the recording. MRI: Scheduled for 02/11/2022 2D Echo: Pending Cerebral angiogram per NIR LDL scheduled for 02/11/2022  HgbA1c 5.7 (02/10/2022) VTE prophylaxis - Scd's start: 02/10/22 0528    Diet   Diet Carb Modified Fluid consistency: Thin; Room service appropriate? Yes   Diet NPO time specified Except for: Sips with Meds   Eliquis (apixaban) daily prior to admission, currently holding for bleed Therapy recommendations:  Pending Disposition:  Pending  SAH  Continue levetiracetam maintenance 500 mg twice daily on discharge Cerebral angiogram per IR Elevate head of bed keep head midline. Blood pressure: MAP >65 SBP <140: Lorazepam 2mg  IV as needed seizure lasting more than 5 minutes and please notify neurology if administered.  Precautions: seizure, aspiration.  Hypertension Home meds: N/A Stable Permissive hypertension (OK if < 220/120) but gradually normalize in 5-7 days Long-term BP goal normotensive  Hyperlipidemia Home meds:  Simvastatin 20mg  qHS, resumed in hospital LDL per above, goal < 70 Continue statin at discharge  Diabetes type II  Controlled Home meds:  Metformin 500mg  BID HgbA1c 5.7, goal < 7.0 CBGs Recent Labs    02/10/22 0139 02/10/22 0742 02/10/22 1206  GLUCAP 191* 116* 115*  SSI  Other Stroke Risk Factors Obesity, Body mass index is 27.26 kg/m., BMI >/= 30 associated with increased stroke risk, recommend weight loss, diet and exercise as appropriate  Hx stroke/TIA  Coronary artery disease   Other Active Problems Gout: continue allopurinol CKD 3A: per primary  Hospital day # 0  Signed: Merrily Brittle, DO Resident, PGY-1 [facility] 02/10/2022, 2:12 PM   ATTENDING ATTESTATION:  66 year old gentleman 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  He had a new onset seizure and EMS was called.  Found to have a small subarachnoid hemorrhage on CT.  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  Discussed case with neurosurgery and no intervention needed.  Also discussed case with Dr. Estanislado Pandy and he plans on doing a diagnostic angiogram on Monday.  His exam is benign sent for left hemiparesis.  He has baseline cognitive impairment likely made worse by Ativan administration.  Continue Keppra for seizure prophylaxis.  Lower extremity Dopplers to eval for DVT propagation. Consulted Neurosurg and spoke with jones NP, no intervention recommended.   We will continue to follow  Dr. Reeves Forth evaluated pt independently, reviewed imaging, chart, labs. Discussed and formulated plan with the resident.  Please see resident note above for details.   Total 36 minutes spent on counseling patient and coordinating care, writing notes and reviewing chart.   Tineka Uriegas,MD   To contact Stroke Continuity provider, please refer to [email or web address] After hours, contact General Neurology

## 2022-02-10 NOTE — Code Documentation (Signed)
Stroke Response Nurse Documentation ?Code Documentation ? ?Kelven Flater is a 66 y.o. male arriving to [facility]  via [facility] on 3/3 with past medical hx of PE/VTE, HTN, DM. On Eliquis (apixaban) daily. Code stroke was activated by EMS.  ? ?Patient from home where he was LKW at 2130 and now complaining of left sided weakness and possible seizure like activity. ? ?Stroke team at the bedside on patient arrival. Labs drawn and patient cleared for CT by Dr. Betsey Holiday. Patient to CT with team. NIHSS 5, see documentation for details and code stroke times. Patient with decreased LOC, disoriented, and left facial droop on exam. The following imaging was completed:  CT Head. Patient is not a candidate for IV Thrombolytic due to Eliquis and hemorrhage. Patient is not not a candidate for IR due to hemorrhage.  ? ? ?Bedside handoff with ED RN Shirlee Limerick.   ? ?Madelynn Done  ?Rapid Response RN ? ? ?

## 2022-02-10 NOTE — Evaluation (Signed)
Clinical/Bedside Swallow Evaluation ?Patient Details  ?Name: Fernando Rhodes ?MRN: [id number] ?Date of Birth: 13-Oct-1956 ? ?Today's Date: 02/10/2022 ?Time: SLP Start Time (ACUTE ONLY): 9629 SLP Stop Time (ACUTE ONLY): 0910 ?SLP Time Calculation (min) (ACUTE ONLY): 13 min ? ?Past Medical History:  ?Past Medical History:  ?Diagnosis Date  ? Diabetes mellitus without complication ([place])   ? Hypertension   ? Renal insufficiency 06/01/2020  ? ?Past Surgical History:  ?Past Surgical History:  ?Procedure Laterality Date  ? ABDOMINAL SURGERY    ? IR US GUIDE BX ASP/DRAIN  06/01/2020  ? ?HPI:  ?Pt is a 66 y.o. who presented to ED with acute onset of suspected seizure.  Pt's wife found him rigid and stiff with left facial droop and shaking all over with froth coming out of his mouth and his eyes rolled back. He was unresponsive and confused with suspected postictal phase his seizures were aborted. CT head (02/10/22) revealed "No acute infarct present. 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 MRI pending. Failed [facility] 02/10/22. 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  ?  ?Assessment / Plan / Recommendation  ?Clinical Impression ? Pt presents with functional oropharyngeal swallow at bedside, without overt s/sx of aspiration including with 3oz water test. Oral mechanism examination significant for slight L facial droop, otherwise unremarkable. Repeated trials of solid (puree to regular textures) and liquid POs consumed without signs of aspiration and complete oral clearance noted upon inspection. Recommend continue regular, thin liquid diet. No further SLP services warranted for swallow function at this time. ? ?SLP Visit Diagnosis: Dysphagia, unspecified (R13.10) ?   ?Aspiration Risk ? No limitations  ?  ?Diet Recommendation Regular;Thin liquid  ? ?Liquid  Administration via: Cup;Straw ?Medication Administration: Whole meds with liquid ?Supervision: Patient able to self feed ?Compensations: Slow rate;Small sips/bites;Minimize environmental distractions ?Postural Changes: Seated upright at 90 degrees  ?  ?Other  Recommendations Oral Care Recommendations: Oral care BID   ? ?Recommendations for follow up therapy are one component of a multi-disciplinary discharge planning process, led by the attending physician.  Recommendations may be updated based on patient status, additional functional criteria and insurance authorization. ? ?Follow up Recommendations No SLP follow up  ? ? ?  ?Assistance Recommended at Discharge None  ?Functional Status Assessment Patient has not had a recent decline in their functional status  ?Frequency and Duration    ?  ?  ?   ? ?Prognosis    ? ?  ? ?Swallow Study   ?General Date of Onset: 02/10/22 ?HPI: Pt is a 66 y.o. who presented to ED with acute onset of suspected seizure.  Pt's wife found him rigid and stiff with left facial droop and shaking all over with froth coming out of his mouth and his eyes rolled back. He was unresponsive and confused with suspected postictal phase his seizures were aborted. CT head (02/10/22) revealed "No acute infarct present. 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 MRI pending. Failed [facility] 02/10/22. 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 ?Type of Study: Bedside Swallow Evaluation ?Previous Swallow Assessment: none per EMR ?Diet Prior to this Study: Regular;Thin liquids ?Temperature Spikes Noted: No ?Respiratory Status: Nasal cannula ?History of Recent Intubation: No ?Behavior/Cognition:  Alert;Cooperative;Pleasant mood ?Oral Cavity Assessment: Within Functional Limits ?Oral Care Completed by SLP: No ?Oral Cavity - Dentition: Adequate  natural dentition ?Vision: Functional for self-feeding ?Self-Feeding Abilities: Able to feed self ?Patient Positioning: Upright in bed;Postural control adequate for testing ?Baseline Vocal Quality: Normal ?Volitional Cough: Strong ?Volitional Swallow: Able to elicit  ?  ?Oral/Motor/Sensory Function Overall Oral Motor/Sensory Function: Mild impairment ?Facial ROM: Within Functional Limits ?Facial Symmetry: Abnormal symmetry left ?Lingual ROM: Within Functional Limits ?Lingual Symmetry: Within Functional Limits ?Velum: Within Functional Limits   ?Ice Chips Ice chips: Within functional limits ?Presentation: Spoon   ?Thin Liquid Thin Liquid: Within functional limits ?Presentation: Self Fed;Straw;Cup  ?  ?Nectar Thick Nectar Thick Liquid: Not tested   ?Honey Thick Honey Thick Liquid: Not tested   ?Puree Puree: Within functional limits ?Presentation: Spoon   ?Solid ? ? ?  Solid: Within functional limits ?Presentation: Self Fed  ? ?  ? ? ?Fernando Rhodes Dense, MA, CCC-SLP ?Acute Rehabilitation Services ?Office Number: [phone] ? ?Fernando Rhodes ?02/10/2022,9:45 AM ? ? ? ? ?

## 2022-02-10 NOTE — Progress Notes (Signed)
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  Last known normal 2130 on 02/09/2022.  No reported tongue biting or bladder/bowel incontinence.  Patient was confused on arrival, likely due to postictal state.  He was noted to have persistent left facial droop.  He denied fever/chills, no nausea/vomiting/diarrhea, no abdominal pain, no chest pain, no shortness of breath, no palpitations, no urinary complaints.  In the ED, temperature 97.3 F, HR 82, RR 18, BP 143/92, SPO2 89% on room air.  Sodium 135, potassium 3.4, chloride 103, CO2 16, glucose 200, BUN 15, creatinine 1.36, AST 24, ALT 15, total bilirubin 0.6.  WBC 7.9, hemoglobin 14.7, platelets 200.  COVID-19 PCR negative.  Influenza A/B PCR negative.  Hemoglobin A1c 5.7.  CT head without contrast with small focus of subarachnoid hyperdensity at the superior medial aspect of encephalomalacia concerning for subarachnoid hemorrhage; old right insular/opercular infarct.  Neurology was consulted.  EDP ordered Eliquis reversal, labetalol 5 mg IV, 1000 mg IV Keppra loading dose.  Hospitalist service consulted for further evaluation and management of acute subarachnoid hemorrhage with seizure.     Assessment & Plan:   Assessment and Plan: * Seizure [facility]) Patient presenting to ED via EMS after being found unresponsive by spouse with seizure-like activity.  No signs of tongue trauma, and no reported bowel/bladder incontinence.  Patient with history of CVA.  No signs of infectious etiology.  Loaded with IV Keppra.  EEG with no seizure or epileptiform discharges noted throughout the  reading. --Neurology following, appreciate assistance --Keppra 500 mg BID --Ativan 2 mg IV PRN for seizure lasting >38min --Seizure/aspiration precautions   Acute CVA (cerebrovascular accident) [facility]) Patient presenting to ED following seizure activity with left-sided facial droop which is new.  CT head with small focus of subarachnoid hyperdensity superior medial aspect of the area of encephalomalacia favored to be a small amount of subarachnoid hemorrhage, old right insular/opercular infarct.  CTA head/neck with no emergent large vessel occlusion or high-grade stenosis of the intracranial arteries, no dissection/aneurysm or hemodynamically significant stenosis of the carotid/vertebral arteries.  Repeat CT head stable appearance of right MCA infarct and encephalomalacia.  Hemoglobin A1c 5.7. --Neurology following, appreciate assistance --Lipid panel pending --TTE: Pending --MRI brain planned on 3/4 --Cerebral angiogram planned for 3/6 --Simvastatin 20 mg p.o. daily --Antiplatelet/anticoagulants contraindicated in [facility] --PT/OT/SLP evaluation    Subarachnoid hemorrhage CT head on admission with small focus of subarachnoid hyperdensity superior medial aspect of the area of encephalomalacia favored to be small amount of subarachnoid hemorrhage.  Complicated by history of PE/DVT currently on Eliquis.  Patient received Eliquis reversal by EDP.  CTA head/neck with no dissection/aneurysm noted. --Continue Keppra as above --MR brain pending tomorrow --Neuro interventional radiology plans cerebral angiogram Monday     Dyslipidemia --Lipid panel: Pending --Continue home simvastatin 20 mg p.o. daily  Type 2 diabetes mellitus with chronic kidney disease, without long-term current use of insulin (HCC) Hemoglobin A1c 5.7, well controlled.  Home regimen includes Forman 500 mg p.o. twice daily. --Hold oral hypoglycemics while inpatient --SSI for coverage --CBGs qAC/HS   Hx pulmonary embolism,  LLE DVT Patient with past history of saddle  pulmonary embolism and left lower extremity DVT in June 2021.  Has been on Eliquis since.  Eliquis has been reversed by EDP for concerns of subarachnoid hemorrhage as above. 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  Stage 3a chronic kidney disease (CKD) (HCC) Baseline creatinine 1.2-1.6.  Creatinine on admission 1.36. --Avoid nephrotoxins, renal dose all medications --Holding metformin as above --BMP daily    DVT prophylaxis: SCD's Start: 02/10/22 0528    Code Status: Full Code Family Communication: Updated patient's family present at bedside this morning  Disposition Plan:  Level of care: Progressive Status is: Inpatient Remains inpatient appropriate because: Pending MRI brain, pending cerebral angiogram planned for Monday, need PT/OT/SLP evaluation    Consultants:  Neurology Neuro interventional radiology  Procedures:  TTE Vascular duplex ultrasound lower extremities  Antimicrobials:  None   Subjective: Patient seen examined bedside, resting comfortably.  Continues in ED holding area, having EEG performed.  Slightly confused with dysarthria.  Family present at bedside.  Updated on plan of care.  Patient continues with left-sided weakness, otherwise no other specific complaints at this time.  Denies headache, no visual changes, no chest pain, no palpitations, no shortness of breath, no abdominal pain, no fever/chills/night sweats, no nausea/vomiting/diarrhea.  No acute events overnight per nursing staff.  Objective: Vitals:   02/10/22 1200 02/10/22 1300 02/10/22 1510 02/10/22 1539  BP: (!) 147/98 (!) 149/98  (!) 143/91  Pulse: 69 66  70  Resp: 20 19  20   Temp:    98.3 F (36.8 C)  TempSrc:    Oral  SpO2: 98% 96%  97%  Weight:   72.5 kg   Height:   5\' 9"  (1.753 m)      Intake/Output Summary (Last 24 hours) at 02/10/2022 1541 Last data filed at 02/10/2022 1500 Gross per 24 hour  Intake 1417.02 ml  Output --  Net 1417.02 ml   Filed Weights   02/10/22 0100 02/10/22 1510  Weight: 76.6 kg 72.5 kg    Examination:  Physical Exam: GEN: NAD, alert and oriented x 2, chronically ill appearance, appears older than stated age HEENT: NCAT, PERRL, EOMI, sclera clear, MMM PULM: CTAB w/o wheezes/crackles, normal respiratory effort, on room air CV: RRR w/o M/G/R GI: abd soft, NTND, NABS, no R/G/M MSK: no peripheral edema, lower extremity muscle strength slightly decreased 4/5, otherwise globally intact.   NEURO: Left facial droop noted, otherwise CN II-XII intact, sensation to light touch intact PSYCH: normal mood/affect Integumentary: dry/intact, no rashes or wounds    Data Reviewed: I have personally reviewed following labs and imaging studies  CBC: Recent Labs  Lab 02/10/22 0139 02/10/22 0148  WBC 7.9  --   NEUTROABS 3.7  --   HGB 14.7 13.9  HCT 42.8 41.0  MCV 95.7  --   PLT 200  --    Basic Metabolic Panel: Recent Labs  Lab 02/10/22 0139 02/10/22 0148  NA 135 139  K 3.4* 3.5  CL 103 105  CO2 16*  --   GLUCOSE 200* 188*  BUN 15 18  CREATININE 1.36* 1.40*  CALCIUM 8.6*  --    GFR: Estimated Creatinine Clearance: 54 mL/min (A) (by C-G formula based on SCr of 1.4 mg/dL (H)). Liver Function Tests: Recent Labs  Lab 02/10/22 0139  AST 24  ALT 15  ALKPHOS 78  BILITOT 0.6  PROT 6.4*  ALBUMIN 3.6  No results for input(s): LIPASE, AMYLASE in the last 168 hours. No results for input(s): AMMONIA in the last 168 hours. Coagulation Profile: Recent Labs  Lab 02/10/22 0139  INR 1.1   Cardiac Enzymes: No results for input(s): CKTOTAL, CKMB, CKMBINDEX, TROPONINI in the last 168 hours. BNP (last 3 results) No results for input(s): PROBNP in the last 8760 hours. HbA1C: Recent Labs    02/10/22 0139  HGBA1C 5.7*   CBG: Recent  Labs  Lab 02/10/22 0139 02/10/22 0742 02/10/22 1206  GLUCAP 191* 116* 115*   Lipid Profile: No results for input(s): CHOL, HDL, LDLCALC, TRIG, CHOLHDL, LDLDIRECT in the last 72 hours. Thyroid Function Tests: No results for input(s): TSH, T4TOTAL, FREET4, T3FREE, THYROIDAB in the last 72 hours. Anemia Panel: No results for input(s): VITAMINB12, FOLATE, FERRITIN, TIBC, IRON, RETICCTPCT in the last 72 hours. Sepsis Labs: No results for input(s): PROCALCITON, LATICACIDVEN in the last 168 hours.  Recent Results (from the past 240 hour(s))  Resp Panel by RT-PCR (Flu A&B, Covid) Nasopharyngeal Swab     Status: None   Collection Time: 02/10/22  2:32 AM   Specimen: Nasopharyngeal Swab; Nasopharyngeal(NP) swabs in vial transport medium  Result Value Ref Range Status   SARS Coronavirus 2 by RT PCR NEGATIVE NEGATIVE Final    Comment: (NOTE) SARS-CoV-2 target nucleic acids are NOT DETECTED.  The SARS-CoV-2 RNA is generally detectable in upper respiratory specimens during the acute phase of infection. The lowest concentration of SARS-CoV-2 viral copies this assay can detect is 138 copies/mL. A negative result does not preclude SARS-Cov-2 infection and should not be used as the sole basis for treatment or other patient management decisions. A negative result may occur with  improper specimen collection/handling, submission of specimen other than nasopharyngeal swab, presence of viral mutation(s) within the areas targeted by this assay, and inadequate number of viral copies(<138 copies/mL). A negative result must be combined with clinical observations, patient history, and epidemiological information. The expected result is Negative.  Fact Sheet for Patients:  EntrepreneurPulse.com.au  Fact Sheet for Healthcare Providers:  IncredibleEmployment.be  This test is no t yet approved or cleared by the [place] FDA and  has been authorized for detection  and/or diagnosis of SARS-CoV-2 by FDA under an Emergency Use Authorization (EUA). This EUA will remain  in effect (meaning this test can be used) for the duration of the COVID-19 declaration under Section 564(b)(1) of the Act, 21 U.S.C.section 360bbb-3(b)(1), unless the authorization is terminated  or revoked sooner.       Influenza A by PCR NEGATIVE NEGATIVE Final   Influenza B by PCR NEGATIVE NEGATIVE Final    Comment: (NOTE) The Xpert Xpress SARS-CoV-2/FLU/RSV plus assay is intended as an aid in the diagnosis of influenza from Nasopharyngeal swab specimens and should not be used as a sole basis for treatment. Nasal washings and aspirates are unacceptable for Xpert Xpress SARS-CoV-2/FLU/RSV testing.  Fact Sheet for Patients: EntrepreneurPulse.com.au  Fact Sheet for Healthcare Providers: IncredibleEmployment.be  This test is not yet approved or cleared by the [place] FDA and has been authorized for detection and/or diagnosis of SARS-CoV-2 by FDA under an Emergency Use Authorization (EUA). This EUA will remain in effect (meaning this test can be used) for the duration of the COVID-19 declaration under Section 564(b)(1) of the Act, 21 U.S.C. section 360bbb-3(b)(1), unless the authorization is terminated or revoked.  Performed at [facility], [place] [address], [place], [place] [address]  Radiology Studies: CT HEAD WO CONTRAST (5MM)  Result Date: 02/10/2022 CLINICAL DATA:  Subarachnoid hemorrhage. EXAM: CT HEAD WITHOUT CONTRAST TECHNIQUE: Contiguous axial images were obtained from the base of the skull through the vertex without intravenous contrast. RADIATION DOSE REDUCTION: This exam was performed according to the departmental dose-optimization program which includes automated exposure control, adjustment of the mA and/or kV according to patient size and/or use of iterative reconstruction technique. COMPARISON:  CT head  and CTA head and neck 02/10/2022 FINDINGS: Brain: The right MCA infarct and encephalomalacia is again noted. Focal hyperdensity along the superomedial aspect of the encephalomalacia is stable. The area is more sharply defined on this study. An additional linear hyperdensity is present along the posterior superior aspect of the encephalomalacia, also sharply defined. No new hyperdensities are present. No acute infarct is present. Basal ganglia are unchanged. Ex vacuo dilation of the right lateral ventricle is stable. A remote infarct in the posterior left operculum is stable. Left hemisphere is otherwise unremarkable. The brainstem and cerebellum are within normal limits. Vascular: No hyperdense vessel or unexpected calcification. Skull: Calvarium is intact. No focal lytic or blastic lesions are present. No significant extracranial soft tissue lesion is present. Sinuses/Orbits: The paranasal sinuses and mastoid air cells are clear. The globes and orbits are within normal limits. IMPRESSION: 1. 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 2. Focal hyperdensity along the superomedial aspect of the encephalomalacia is more sharply defined on this study. This likely represents calcification related to the prior infarcts. No definite blood products are seen. 3. Stable remote infarct of the posterior left operculum. Of note a similar linear calcification is present. 4. Follow-up head CT in 24-48 hours could be used to confirm stability of these hyperdensities. Electronically Signed   By: San Morelle M.D.   On: 02/10/2022 08:30   EEG adult  Result Date: 02/10/2022 Lora Havens, MD     02/10/2022  9:57 AM Patient Name: Desi Rowe MRN: [id number] Epilepsy Attending: Lora Havens Referring Physician/Provider: Gwinda Maine, MD Date: 02/10/2022 Duration: 25.24 mins Patient history:  66 y.o. male PMHx DVT and PE on apixaban woke with seizure found to have [place]. EEG to evaluate for  seizure Level of alertness: Awake AEDs during EEG study: LEV Technical aspects: This EEG study was done with scalp electrodes positioned according to the 10-20 International system of electrode placement. Electrical activity was acquired at a sampling rate of 500Hz  and reviewed with a high frequency filter of 70Hz  and a low frequency filter of 1Hz . EEG data were recorded continuously and digitally stored. Description: The posterior dominant rhythm consists of 9-10 Hz activity of moderate voltage (25-35 uV) seen predominantly in posterior head regions, symmetric and reactive to eye opening and eye closing. Physiologic photic driving was seen during photic stimulation.  Hyperventilation was not performed.   IMPRESSION: This study is within normal limits. No seizures or epileptiform discharges were seen throughout the recording. Lora Havens   ECHOCARDIOGRAM COMPLETE BUBBLE STUDY  Result Date: 02/10/2022    ECHOCARDIOGRAM REPORT   Patient Name:   DAMERE BRANDENBURG Date of Exam: 02/10/2022 Medical Rec #:  [id number]            Height:       66.0 in Accession #:    [id number]           Weight:       168.9 lb Date of Birth:  [birth year]  BSA:          1.861 m Patient Age:    5 years             BP:           130/85 mmHg Patient Gender: M                    HR:           61 bpm. Exam Location:  Inpatient Procedure: 2D Echo, Cardiac Doppler, Color Doppler and Saline Contrast Bubble            Study Indications:    CVA  History:        Patient has prior history of Echocardiogram examinations, most                 recent 05/31/2020. CHF; Risk Factors:Hypertension and Diabetes.  Sonographer:    Luisa Hart RDCS Referring Phys: [id number] JAN A MANSY  Sonographer Comments: Suboptimal apical window and suboptimal subcostal window. Image acquisition challenging due to patient body habitus. IMPRESSIONS  1. Left ventricular ejection fraction, by estimation, is 60 to 65%. The left ventricle has normal function. The  left ventricle has no regional wall motion abnormalities. There is moderate left ventricular hypertrophy. Left ventricular diastolic parameters were normal.  2. Right ventricular systolic function is normal. The right ventricular size is normal.  3. Bubble study attempted but not diagnostic due to poor echo window.  4. The pericardial effusion is posterior to the left ventricle.  5. The mitral valve is abnormal. Mild mitral valve regurgitation. No evidence of mitral stenosis.  6. The aortic valve is tricuspid. There is mild calcification of the aortic valve. Aortic valve regurgitation is not visualized. Aortic valve sclerosis is present, with no evidence of aortic valve stenosis.  7. The inferior vena cava is normal in size with greater than 50% respiratory variability, suggesting right atrial pressure of 3 mmHg. FINDINGS  Left Ventricle: Left ventricular ejection fraction, by estimation, is 60 to 65%. The left ventricle has normal function. The left ventricle has no regional wall motion abnormalities. The left ventricular internal cavity size was normal in size. There is  moderate left ventricular hypertrophy. Left ventricular diastolic parameters were normal. Right Ventricle: The right ventricular size is normal. No increase in right ventricular wall thickness. Right ventricular systolic function is normal. Left Atrium: Left atrial size was normal in size. Right Atrium: Right atrial size was normal in size. Pericardium: Trivial pericardial effusion is present. The pericardial effusion is posterior to the left ventricle. Mitral Valve: The mitral valve is abnormal. There is moderate thickening of the mitral valve leaflet(s). There is mild calcification of the mitral valve leaflet(s). Mild mitral valve regurgitation. No evidence of mitral valve stenosis. Tricuspid Valve: The tricuspid valve is normal in structure. Tricuspid valve regurgitation is trivial. No evidence of tricuspid stenosis. Aortic Valve: The aortic  valve is tricuspid. There is mild calcification of the aortic valve. Aortic valve regurgitation is not visualized. Aortic valve sclerosis is present, with no evidence of aortic valve stenosis. Aortic valve mean gradient measures 2.0 mmHg. Aortic valve peak gradient measures 3.0 mmHg. Aortic valve area, by VTI measures 3.50 cm. Pulmonic Valve: The pulmonic valve was normal in structure. Pulmonic valve regurgitation is not visualized. No evidence of pulmonic stenosis. Aorta: The aortic root is normal in size and structure. Venous: The inferior vena cava is normal in size with greater than 50% respiratory variability, suggesting right atrial pressure of  3 mmHg. IAS/Shunts: The interatrial septum was not well visualized. Agitated saline contrast was given intravenously to evaluate for intracardiac shunting.  LEFT VENTRICLE PLAX 2D LVIDd:         4.10 cm     Diastology LVIDs:         2.40 cm     LV e' medial:    4.80 cm/s LV PW:         1.10 cm     LV E/e' medial:  11.5 LV IVS:        1.50 cm     LV e' lateral:   8.03 cm/s LVOT diam:     2.20 cm     LV E/e' lateral: 6.9 LV SV:         67 LV SV Index:   36 LVOT Area:     3.80 cm  LV Volumes (MOD) LV vol d, MOD A2C: 62.4 ml LV vol d, MOD A4C: 59.0 ml LV vol s, MOD A2C: 27.6 ml LV vol s, MOD A4C: 20.3 ml LV SV MOD A2C:     34.8 ml LV SV MOD A4C:     59.0 ml LV SV MOD BP:      40.7 ml RIGHT VENTRICLE RV Basal diam:  2.90 cm RV Mid diam:    2.10 cm LEFT ATRIUM             Index        RIGHT ATRIUM           Index LA diam:        3.20 cm 1.72 cm/m   RA Area:     16.00 cm LA Vol (A2C):   34.0 ml 18.27 ml/m  RA Volume:   40.90 ml  21.98 ml/m LA Vol (A4C):   46.9 ml 25.20 ml/m LA Biplane Vol: 40.8 ml 21.92 ml/m  AORTIC VALVE                    PULMONIC VALVE AV Area (Vmax):    3.55 cm     PV Vmax:       0.61 m/s AV Area (Vmean):   3.36 cm     PV Vmean:      46.700 cm/s AV Area (VTI):     3.50 cm     PV VTI:        0.164 m AV Vmax:           86.30 cm/s   PV Peak grad:   1.5 mmHg AV Vmean:          58.600 cm/s  PV Mean grad:  1.0 mmHg AV VTI:            0.191 m AV Peak Grad:      3.0 mmHg AV Mean Grad:      2.0 mmHg LVOT Vmax:         80.60 cm/s LVOT Vmean:        51.800 cm/s LVOT VTI:          0.176 m LVOT/AV VTI ratio: 0.92  AORTA Ao Root diam: 2.90 cm Ao Asc diam:  3.20 cm MITRAL VALVE MV Area (PHT): 3.91 cm    SHUNTS MV Decel Time: 194 msec    Systemic VTI:  0.18 m MV E velocity: 55.20 cm/s  Systemic Diam: 2.20 cm MV A velocity: 70.60 cm/s MV E/A ratio:  0.78 Jenkins Rouge MD Electronically signed by Jenkins Rouge MD Signature Date/Time: 02/10/2022/10:56:35 AM  Final    CT HEAD CODE STROKE WO CONTRAST  Result Date: 02/10/2022 CLINICAL DATA:  Code stroke. EXAM: CT HEAD WITHOUT CONTRAST TECHNIQUE: Contiguous axial images were obtained from the base of the skull through the vertex without intravenous contrast. RADIATION DOSE REDUCTION: This exam was performed according to the departmental dose-optimization program which includes automated exposure control, adjustment of the mA and/or kV according to patient size and/or use of iterative reconstruction technique. COMPARISON:  None. 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 At the superomedial aspect of the area of encephalomalacia, there is a small focus of subarachnoid hyperdensity. No mass effect. Vascular: No abnormal hyperdensity of the major intracranial arteries or dural venous sinuses. No intracranial atherosclerosis. Skull: The visualized skull base, calvarium and extracranial soft tissues are normal. Sinuses/Orbits: No fluid levels or advanced mucosal thickening of the visualized paranasal sinuses. No mastoid or middle ear effusion. The orbits are normal. IMPRESSION: 1. Small focus of subarachnoid hyperdensity at the superomedial aspect of the area of encephalomalacia, favored to be a small amount of subarachnoid hemorrhage. 2. 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 Critical Value/emergent results  were called by telephone at the time of interpretation on 02/10/2022 at 1:54 am to provider Lynnae Sandhoff, who verbally acknowledged these results. Electronically Signed   By: Ulyses Jarred M.D.   On: 02/10/2022 01:59   VAS [place] LOWER EXTREMITY VENOUS (DVT)  Result Date: 02/10/2022  Lower Venous DVT Study Patient Name:  JOAKIM HUESMAN  Date of Exam:   02/10/2022 Medical Rec #: [id number]             Accession #:    [id number] Date of Birth: [birth year]             Patient Gender: M Patient Age:   71 years Exam Location:  [facility] Procedure:      VAS [place] LOWER EXTREMITY VENOUS (DVT) Referring Phys: Coralie Stanke [place] --------------------------------------------------------------------------------  Indications: History of left lower extremity DVT,.  Anticoagulation: Previously on Eliquis, now with SAH. Comparison Study: 06-08-2020 Prior bilateral lower extremity venous was positive                   for acute DVT involving the left lower extremity. Performing Technologist: Darlin Coco RDMS, RVT  Examination Guidelines: A complete evaluation includes B-mode imaging, spectral Doppler, color Doppler, and power Doppler as needed of all accessible portions of each vessel. Bilateral testing is considered an integral part of a complete examination. Limited examinations for reoccurring indications may be performed as noted. The reflux portion of the exam is performed with the patient in reverse Trendelenburg.  +---------+---------------+---------+-----------+----------+--------------+  RIGHT     Compressibility Phasicity Spontaneity Properties Thrombus Aging  +---------+---------------+---------+-----------+----------+--------------+  CFV       Full            Yes       Yes                                    +---------+---------------+---------+-----------+----------+--------------+  SFJ       Full                                                              +---------+---------------+---------+-----------+----------+--------------+  FV Prox   Full                                                             +---------+---------------+---------+-----------+----------+--------------+  FV Mid    Full                                                             +---------+---------------+---------+-----------+----------+--------------+  FV Distal Full                                                             +---------+---------------+---------+-----------+----------+--------------+  PFV       Full                                                             +---------+---------------+---------+-----------+----------+--------------+  POP       Full                                                             +---------+---------------+---------+-----------+----------+--------------+  PTV       Full                                                             +---------+---------------+---------+-----------+----------+--------------+  PERO      Full                                                             +---------+---------------+---------+-----------+----------+--------------+  Gastroc   Full                                                             +---------+---------------+---------+-----------+----------+--------------+   +---------+---------------+---------+-----------+----------+--------------+  LEFT      Compressibility Phasicity Spontaneity Properties Thrombus Aging  +---------+---------------+---------+-----------+----------+--------------+  CFV       Full            Yes       Yes                                    +---------+---------------+---------+-----------+----------+--------------+  SFJ       Full                                                             +---------+---------------+---------+-----------+----------+--------------+  FV Prox   None            No        No                     Chronic          +---------+---------------+---------+-----------+----------+--------------+  FV Mid    None            No        No                     Chronic         +---------+---------------+---------+-----------+----------+--------------+  FV Distal None            No        No                     Chronic         +---------+---------------+---------+-----------+----------+--------------+  PFV       Full            Yes       Yes                                    +---------+---------------+---------+-----------+----------+--------------+  POP       Partial         Yes       Yes                    Chronic         +---------+---------------+---------+-----------+----------+--------------+  PTV       Full                                                             +---------+---------------+---------+-----------+----------+--------------+  PERO      Full                                                             +---------+---------------+---------+-----------+----------+--------------+  Gastroc   Full                                                             +---------+---------------+---------+-----------+----------+--------------+    Summary: RIGHT: - There is no evidence of deep vein thrombosis in the lower extremity.  - No cystic structure found in the popliteal fossa.  LEFT: - Findings consistent with chronic deep vein thrombosis involving the  left femoral vein, and left popliteal vein. - No cystic structure found in the popliteal fossa.  *See table(s) above for measurements and observations.    Preliminary    CT ANGIO HEAD NECK W WO CM (CODE STROKE)  Result Date: 02/10/2022 CLINICAL DATA:  Stroke follow-up EXAM: CT ANGIOGRAPHY HEAD AND NECK TECHNIQUE: Multidetector CT imaging of the head and neck was performed using the standard protocol during bolus administration of intravenous contrast. Multiplanar CT image reconstructions and MIPs were obtained to evaluate the vascular anatomy. Carotid stenosis measurements (when  applicable) are obtained utilizing NASCET criteria, using the distal internal carotid diameter as the denominator. RADIATION DOSE REDUCTION: This exam was performed according to the departmental dose-optimization program which includes automated exposure control, adjustment of the mA and/or kV according to patient size and/or use of iterative reconstruction technique. CONTRAST:  36mL OMNIPAQUE IOHEXOL 350 MG/ML SOLN COMPARISON:  None. FINDINGS: CTA NECK FINDINGS SKELETON: There is no bony spinal canal stenosis. No lytic or blastic lesion. OTHER NECK: Normal pharynx, larynx and major salivary glands. No cervical lymphadenopathy. Unremarkable thyroid gland. UPPER CHEST: No pneumothorax or pleural effusion. No nodules or masses. AORTIC ARCH: Below the field of view. The visualized proximal subclavian arteries are widely patent. RIGHT CAROTID SYSTEM: Normal without aneurysm, dissection or stenosis. LEFT CAROTID SYSTEM: Normal without aneurysm, dissection or stenosis. VERTEBRAL ARTERIES: Left dominant configuration. Both origins are clearly patent. There is no dissection, occlusion or flow-limiting stenosis to the skull base (V1-V3 segments). CTA HEAD FINDINGS POSTERIOR CIRCULATION: --Vertebral arteries: Normal V4 segments. --Inferior cerebellar arteries: Normal. --Basilar artery: Normal. --Superior cerebellar arteries: Normal. --Posterior cerebral arteries (PCA): Normal. ANTERIOR CIRCULATION: --Intracranial internal carotid arteries: Normal. --Anterior cerebral arteries (ACA): Normal. Both A1 segments are present. Patent anterior communicating artery (a-comm). --Middle cerebral arteries (MCA): Normal. VENOUS SINUSES: As permitted by contrast timing, patent. ANATOMIC VARIANTS: None Review of the MIP images confirms the above findings. IMPRESSION: 1. No emergent large vessel occlusion or high-grade stenosis of the intracranial arteries. 2. No dissection, aneurysm or hemodynamically significant stenosis of the carotid or  vertebral arteries. Electronically Signed   By: Ulyses Jarred M.D.   On: 02/10/2022 03:10        Scheduled Meds:   stroke: mapping our early stages of recovery book   Does not apply Once   ferrous sulfate  220 mg Oral Daily   insulin aspart  0-9 Units Subcutaneous TID AC & HS   labetalol  5 mg Intravenous Once   multivitamin with minerals  1 tablet Oral Daily   simvastatin  20 mg Oral QHS   Continuous Infusions:  sodium chloride 100 mL/hr at 02/10/22 0653   [START ON 02/13/2022]  ceFAZolin (ANCEF) IV     levETIRAcetam 500 mg (02/10/22 1328)     LOS: 0 days    Time spent: 58 minutes spent on chart review, discussion with nursing staff, consultants, updating family and interview/physical exam; more than 50% of that time was spent in counseling and/or coordination of care.    Victoria Henshaw J [place] DO [facility] Available via Epic secure chat 7am-7pm After these hours, please refer to coverage provider listed on [email or web address] 02/10/2022, 3:41 PM

## 2022-02-10 NOTE — Assessment & Plan Note (Addendum)
Hemoglobin A1c 5.7, well controlled.  Home regimen includes metformin 500 mg p.o. twice daily. ? ?

## 2022-02-10 NOTE — Consult Note (Addendum)
Chief Complaint: Patient was seen in consultation today for diagnostic cerebral angiogram  Chief Complaint  Patient presents with   Code Stroke   at the request of the Stroke team   Referring Physician(s): the Stroke team   Supervising Physician: Luanne Bras  Patient Status: [facility] - ED  History of Present Illness: Rollyn Scialdone is a 66 y.o. male with PMHs of HTN, DM, CKD, DVT on Eliquis who presented to [facility] ED on 02/10/22 due to acute onset of suspected seizure. Patient arrived as code stroke and underwent workup.   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 CTA head and neck was negative for large vessel occlusion or high-grade stenosis of the intracranial arteries, no dissection, aneurysm or hemodynamically significant stenosis of the carotid or vertebral arteries.   NIR was requested for diagnostic cerebral angiogram by the Stroke team.   Patient seen in ED. Vascular US tech and wife at bedside.  He reports that he is feeling good, does not have any complaints at the moment.  Wife states that his arms and legs became stiff and he started to foam at the mouth, which prompted her to call the 911. Wife states no history of previous seizure.   Patient laying in bed, not in acute distress.  Denise headache, fever, chills, shortness of breath, cough, chest pain, abdominal pain, nausea ,vomiting, and bleeding.    Past Medical History:  Diagnosis Date   Diabetes mellitus without complication ([place])    Hypertension    Renal insufficiency 06/01/2020    Past Surgical History:  Procedure Laterality Date   ABDOMINAL SURGERY     IR US GUIDE BX ASP/DRAIN  06/01/2020    Allergies: Patient has no known allergies.  Medications: Prior to Admission medications   Medication Sig Start Date End Date  Taking? Authorizing Provider  acetaminophen (TYLENOL) 500 MG tablet Take 1,000 mg by mouth every 6 (six) hours as needed for headache (pain).   Yes [provider]  ELIQUIS 5 MG TABS tablet Take 5 mg by mouth 2 (two) times daily. 12/12/21  Yes [provider]  FERROUS SULFATE PO Take 1 tablet by mouth daily. Unsure of strength   Yes [provider]  metFORMIN (GLUCOPHAGE) 500 MG tablet Take 1 tablet (500 mg total) by mouth 2 (two) times daily with a meal. 06/04/20 02/10/22 Yes Gonfa, Charlesetta Ivory, MD  Multiple Vitamins-Minerals (MULTIVITAMIN GUMMIES ADULT PO) Take 2 tablets by mouth daily. Take 2 gummies daily   Yes [provider]  simvastatin (ZOCOR) 20 MG tablet Take 20 mg by mouth at bedtime.    Yes [provider]  allopurinol (ZYLOPRIM) 100 MG tablet Take 1 tablet (100 mg total) by mouth daily. Patient not taking: Reported on 07/27/2020 05/25/20   Newt Minion, MD  APIXABAN Arne [place]) VTE STARTER PACK (10MG  AND 5MG ) Take as directed on package: start with two-5mg  tablets twice daily for 7 days. On day 8, switch to one-5mg  tablet twice daily. Patient not taking: Reported on 02/10/2022 06/04/20   Mercy Riding, MD  lisinopril-hydrochlorothiazide (PRINZIDE,ZESTORETIC) 20-12.5 MG per tablet TAKE ONE TABLET BY MOUTH EVERY DAY Patient not taking: No sig reported 07/30/11   Denita Lung, MD     History reviewed. No pertinent family history.  Social History   Socioeconomic History   Marital status: Single  Spouse name: Not on file   Number of children: Not on file   Years of education: Not on file   Highest education level: Not on file  Occupational History   Not on file  Tobacco Use   Smoking status: Never   Smokeless tobacco: Never  Substance and Sexual Activity   Alcohol use: No   Drug use: No   Sexual activity: Not on file  Other Topics Concern   Not on file  Social History Narrative   Not on file   Social Determinants of Health   Financial  Resource Strain: Not on file  Food Insecurity: Not on file  Transportation Needs: Not on file  Physical Activity: Not on file  Stress: Not on file  Social Connections: Not on file     Review of Systems: A 12 point ROS discussed and pertinent positives are indicated in the HPI above.  All other systems are negative.  Vital Signs: BP (!) 157/96    Pulse 63    Temp (!) 97.3 F (36.3 C) (Oral)    Resp 16    Wt 168 lb 14 oz (76.6 kg)    SpO2 100%    BMI 27.26 kg/m    Physical Exam Vitals reviewed.  Constitutional:      General: He is not in acute distress.    Appearance: Normal appearance. He is not ill-appearing.  HENT:     Head: Normocephalic and atraumatic.     Mouth/Throat:     Mouth: Mucous membranes are moist.  Cardiovascular:     Rate and Rhythm: Normal rate and regular rhythm.     Heart sounds: Normal heart sounds.  Pulmonary:     Effort: Pulmonary effort is normal.     Breath sounds: Normal breath sounds.  Abdominal:     General: Abdomen is flat. Bowel sounds are normal.     Palpations: Abdomen is soft.  Skin:    General: Skin is warm and dry.  Neurological:     Mental Status: He is alert.     Comments: Alert, awake, and oriented to self and place only. Speech and comprehension intact PERRL  EOMs intact, no nystagmus or subjective diplopia. No facial asymmetry. Tongue midline  Motor power intact, right UE slightly weaker than left  No pronator drift.  Psychiatric:        Mood and Affect: Mood normal.        Behavior: Behavior normal.    MD Evaluation Airway: WNL Heart: WNL Abdomen: WNL Chest/ Lungs: WNL ASA  Classification: 3 Mallampati/Airway Score: Two  Imaging: CT HEAD WO CONTRAST (5MM)  Result Date: 02/10/2022 CLINICAL DATA:  Subarachnoid hemorrhage. EXAM: CT HEAD WITHOUT CONTRAST TECHNIQUE: Contiguous axial images were obtained from the base of the skull through the vertex without intravenous contrast. RADIATION DOSE REDUCTION: This exam was  performed according to the departmental dose-optimization program which includes automated exposure control, adjustment of the mA and/or kV according to patient size and/or use of iterative reconstruction technique. COMPARISON:  CT head and CTA head and neck 02/10/2022 FINDINGS: Brain: The right MCA infarct and encephalomalacia is again noted. Focal hyperdensity along the superomedial aspect of the encephalomalacia is stable. The area is more sharply defined on this study. An additional linear hyperdensity is present along the posterior superior aspect of the encephalomalacia, also sharply defined. No new hyperdensities are present. No acute infarct is present. Basal ganglia are unchanged. Ex vacuo dilation of the right lateral ventricle is stable. A remote infarct  in the posterior left operculum is stable. Left hemisphere is otherwise unremarkable. The brainstem and cerebellum are within normal limits. Vascular: No hyperdense vessel or unexpected calcification. Skull: Calvarium is intact. No focal lytic or blastic lesions are present. No significant extracranial soft tissue lesion is present. Sinuses/Orbits: The paranasal sinuses and mastoid air cells are clear. The globes and orbits are within normal limits. IMPRESSION: 1. 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 2. Focal hyperdensity along the superomedial aspect of the encephalomalacia is more sharply defined on this study. This likely represents calcification related to the prior infarcts. No definite blood products are seen. 3. Stable remote infarct of the posterior left operculum. Of note a similar linear calcification is present. 4. Follow-up head CT in 24-48 hours could be used to confirm stability of these hyperdensities. Electronically Signed   By: San Morelle M.D.   On: 02/10/2022 08:30   EEG adult  Result Date: 02/10/2022 Lora Havens, MD     02/10/2022  9:57 AM Patient Name: Dougles Kimmey MRN: [id number]  Epilepsy Attending: Lora Havens Referring Physician/Provider: Gwinda Maine, MD Date: 02/10/2022 Duration: 25.24 mins Patient history:  66 y.o. 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 EEG to evaluate for seizure Level of alertness: Awake AEDs during EEG study: LEV Technical aspects: This EEG study was done with scalp electrodes positioned according to the 10-20 International system of electrode placement. Electrical activity was acquired at a sampling rate of 500Hz  and reviewed with a high frequency filter of 70Hz  and a low frequency filter of 1Hz . EEG data were recorded continuously and digitally stored. Description: The posterior dominant rhythm consists of 9-10 Hz activity of moderate voltage (25-35 uV) seen predominantly in posterior head regions, symmetric and reactive to eye opening and eye closing. Physiologic photic driving was seen during photic stimulation.  Hyperventilation was not performed.   IMPRESSION: This study is within normal limits. No seizures or epileptiform discharges were seen throughout the recording. Lora Havens   ECHOCARDIOGRAM COMPLETE BUBBLE STUDY  Result Date: 02/10/2022    ECHOCARDIOGRAM REPORT   Patient Name:   GLENROY CROSSEN Date of Exam: 02/10/2022 Medical Rec #:  [id number]            Height:       66.0 in Accession #:    [id number]           Weight:       168.9 lb Date of Birth:  09/24/1956            BSA:          1.861 m Patient Age:    64 years             BP:           130/85 mmHg Patient Gender: M                    HR:           61 bpm. Exam Location:  Inpatient Procedure: 2D Echo, Cardiac Doppler, Color Doppler and Saline Contrast Bubble            Study Indications:    CVA  History:        Patient has prior history of Echocardiogram examinations, most                 recent 05/31/2020. CHF; Risk Factors:Hypertension and Diabetes.  Sonographer:  Jonelle Sidle CROWN RDCS Referring Phys: [id number] JAN A MANSY  Sonographer Comments:  Suboptimal apical window and suboptimal subcostal window. Image acquisition challenging due to patient body habitus. IMPRESSIONS  1. Left ventricular ejection fraction, by estimation, is 60 to 65%. The left ventricle has normal function. The left ventricle has no regional wall motion abnormalities. There is moderate left ventricular hypertrophy. Left ventricular diastolic parameters were normal.  2. Right ventricular systolic function is normal. The right ventricular size is normal.  3. Bubble study attempted but not diagnostic due to poor echo window.  4. The pericardial effusion is posterior to the left ventricle.  5. The mitral valve is abnormal. Mild mitral valve regurgitation. No evidence of mitral stenosis.  6. The aortic valve is tricuspid. There is mild calcification of the aortic valve. Aortic valve regurgitation is not visualized. Aortic valve sclerosis is present, with no evidence of aortic valve stenosis.  7. The inferior vena cava is normal in size with greater than 50% respiratory variability, suggesting right atrial pressure of 3 mmHg. FINDINGS  Left Ventricle: Left ventricular ejection fraction, by estimation, is 60 to 65%. The left ventricle has normal function. The left ventricle has no regional wall motion abnormalities. The left ventricular internal cavity size was normal in size. There is  moderate left ventricular hypertrophy. Left ventricular diastolic parameters were normal. Right Ventricle: The right ventricular size is normal. No increase in right ventricular wall thickness. Right ventricular systolic function is normal. Left Atrium: Left atrial size was normal in size. Right Atrium: Right atrial size was normal in size. Pericardium: Trivial pericardial effusion is present. The pericardial effusion is posterior to the left ventricle. Mitral Valve: The mitral valve is abnormal. There is moderate thickening of the mitral valve leaflet(s). There is mild calcification of the mitral valve  leaflet(s). Mild mitral valve regurgitation. No evidence of mitral valve stenosis. Tricuspid Valve: The tricuspid valve is normal in structure. Tricuspid valve regurgitation is trivial. No evidence of tricuspid stenosis. Aortic Valve: The aortic valve is tricuspid. There is mild calcification of the aortic valve. Aortic valve regurgitation is not visualized. Aortic valve sclerosis is present, with no evidence of aortic valve stenosis. Aortic valve mean gradient measures 2.0 mmHg. Aortic valve peak gradient measures 3.0 mmHg. Aortic valve area, by VTI measures 3.50 cm. Pulmonic Valve: The pulmonic valve was normal in structure. Pulmonic valve regurgitation is not visualized. No evidence of pulmonic stenosis. Aorta: The aortic root is normal in size and structure. Venous: The inferior vena cava is normal in size with greater than 50% respiratory variability, suggesting right atrial pressure of 3 mmHg. IAS/Shunts: The interatrial septum was not well visualized. Agitated saline contrast was given intravenously to evaluate for intracardiac shunting.  LEFT VENTRICLE PLAX 2D LVIDd:         4.10 cm     Diastology LVIDs:         2.40 cm     LV e' medial:    4.80 cm/s LV PW:         1.10 cm     LV E/e' medial:  11.5 LV IVS:        1.50 cm     LV e' lateral:   8.03 cm/s LVOT diam:     2.20 cm     LV E/e' lateral: 6.9 LV SV:         67 LV SV Index:   36 LVOT Area:     3.80 cm  LV Volumes (MOD) LV vol d,  MOD A2C: 62.4 ml LV vol d, MOD A4C: 59.0 ml LV vol s, MOD A2C: 27.6 ml LV vol s, MOD A4C: 20.3 ml LV SV MOD A2C:     34.8 ml LV SV MOD A4C:     59.0 ml LV SV MOD BP:      40.7 ml RIGHT VENTRICLE RV Basal diam:  2.90 cm RV Mid diam:    2.10 cm LEFT ATRIUM             Index        RIGHT ATRIUM           Index LA diam:        3.20 cm 1.72 cm/m   RA Area:     16.00 cm LA Vol (A2C):   34.0 ml 18.27 ml/m  RA Volume:   40.90 ml  21.98 ml/m LA Vol (A4C):   46.9 ml 25.20 ml/m LA Biplane Vol: 40.8 ml 21.92 ml/m  AORTIC VALVE                     PULMONIC VALVE AV Area (Vmax):    3.55 cm     PV Vmax:       0.61 m/s AV Area (Vmean):   3.36 cm     PV Vmean:      46.700 cm/s AV Area (VTI):     3.50 cm     PV VTI:        0.164 m AV Vmax:           86.30 cm/s   PV Peak grad:  1.5 mmHg AV Vmean:          58.600 cm/s  PV Mean grad:  1.0 mmHg AV VTI:            0.191 m AV Peak Grad:      3.0 mmHg AV Mean Grad:      2.0 mmHg LVOT Vmax:         80.60 cm/s LVOT Vmean:        51.800 cm/s LVOT VTI:          0.176 m LVOT/AV VTI ratio: 0.92  AORTA Ao Root diam: 2.90 cm Ao Asc diam:  3.20 cm MITRAL VALVE MV Area (PHT): 3.91 cm    SHUNTS MV Decel Time: 194 msec    Systemic VTI:  0.18 m MV E velocity: 55.20 cm/s  Systemic Diam: 2.20 cm MV A velocity: 70.60 cm/s MV E/A ratio:  0.78 Jenkins Rouge MD Electronically signed by Jenkins Rouge MD Signature Date/Time: 02/10/2022/10:56:35 AM    Final    CT HEAD CODE STROKE WO CONTRAST  Result Date: 02/10/2022 CLINICAL DATA:  Code stroke. EXAM: CT HEAD WITHOUT CONTRAST TECHNIQUE: Contiguous axial images were obtained from the base of the skull through the vertex without intravenous contrast. RADIATION DOSE REDUCTION: This exam was performed according to the departmental dose-optimization program which includes automated exposure control, adjustment of the mA and/or kV according to patient size and/or use of iterative reconstruction technique. COMPARISON:  None. 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 At the superomedial aspect of the area of encephalomalacia, there is a small focus of subarachnoid hyperdensity. No mass effect. Vascular: No abnormal hyperdensity of the major intracranial arteries or dural venous sinuses. No intracranial atherosclerosis. Skull: The visualized skull base, calvarium and extracranial soft tissues are normal. Sinuses/Orbits: No fluid levels or advanced mucosal thickening of the visualized paranasal sinuses. No mastoid or middle ear  effusion. The orbits are normal.  IMPRESSION: 1. Small focus of subarachnoid hyperdensity at the superomedial aspect of the area of encephalomalacia, favored to be a small amount of subarachnoid hemorrhage. 2. 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 Critical Value/emergent results were called by telephone at the time of interpretation on 02/10/2022 at 1:54 am to provider Lynnae Sandhoff, who verbally acknowledged these results. Electronically Signed   By: Ulyses Jarred M.D.   On: 02/10/2022 01:59   CT ANGIO HEAD NECK W WO CM (CODE STROKE)  Result Date: 02/10/2022 CLINICAL DATA:  Stroke follow-up EXAM: CT ANGIOGRAPHY HEAD AND NECK TECHNIQUE: Multidetector CT imaging of the head and neck was performed using the standard protocol during bolus administration of intravenous contrast. Multiplanar CT image reconstructions and MIPs were obtained to evaluate the vascular anatomy. Carotid stenosis measurements (when applicable) are obtained utilizing NASCET criteria, using the distal internal carotid diameter as the denominator. RADIATION DOSE REDUCTION: This exam was performed according to the departmental dose-optimization program which includes automated exposure control, adjustment of the mA and/or kV according to patient size and/or use of iterative reconstruction technique. CONTRAST:  28mL OMNIPAQUE IOHEXOL 350 MG/ML SOLN COMPARISON:  None. FINDINGS: CTA NECK FINDINGS SKELETON: There is no bony spinal canal stenosis. No lytic or blastic lesion. OTHER NECK: Normal pharynx, larynx and major salivary glands. No cervical lymphadenopathy. Unremarkable thyroid gland. UPPER CHEST: No pneumothorax or pleural effusion. No nodules or masses. AORTIC ARCH: Below the field of view. The visualized proximal subclavian arteries are widely patent. RIGHT CAROTID SYSTEM: Normal without aneurysm, dissection or stenosis. LEFT CAROTID SYSTEM: Normal without aneurysm, dissection or stenosis. VERTEBRAL ARTERIES: Left dominant configuration. Both origins are clearly patent. There  is no dissection, occlusion or flow-limiting stenosis to the skull base (V1-V3 segments). CTA HEAD FINDINGS POSTERIOR CIRCULATION: --Vertebral arteries: Normal V4 segments. --Inferior cerebellar arteries: Normal. --Basilar artery: Normal. --Superior cerebellar arteries: Normal. --Posterior cerebral arteries (PCA): Normal. ANTERIOR CIRCULATION: --Intracranial internal carotid arteries: Normal. --Anterior cerebral arteries (ACA): Normal. Both A1 segments are present. Patent anterior communicating artery (a-comm). --Middle cerebral arteries (MCA): Normal. VENOUS SINUSES: As permitted by contrast timing, patent. ANATOMIC VARIANTS: None Review of the MIP images confirms the above findings. IMPRESSION: 1. No emergent large vessel occlusion or high-grade stenosis of the intracranial arteries. 2. No dissection, aneurysm or hemodynamically significant stenosis of the carotid or vertebral arteries. Electronically Signed   By: Ulyses Jarred M.D.   On: 02/10/2022 03:10    Labs:  CBC: Recent Labs    02/10/22 0139 02/10/22 0148  WBC 7.9  --   HGB 14.7 13.9  HCT 42.8 41.0  PLT 200  --     COAGS: Recent Labs    02/10/22 0139  INR 1.1  APTT 28    BMP: Recent Labs    02/10/22 0139 02/10/22 0148  NA 135 139  K 3.4* 3.5  CL 103 105  CO2 16*  --   GLUCOSE 200* 188*  BUN 15 18  CALCIUM 8.6*  --   CREATININE 1.36* 1.40*  GFRNONAA 58*  --     LIVER FUNCTION TESTS: Recent Labs    02/10/22 0139  BILITOT 0.6  AST 24  ALT 15  ALKPHOS 78  PROT 6.4*  ALBUMIN 3.6    TUMOR MARKERS: No results for input(s): AFPTM, CEA, CA199, CHROMGRNA in the last 8760 hours.  Assessment and Plan: 66 y.o. male with ICH, in need of diagnostic cerebral angiogram for further evaluation.   RF BUN 15, Creatinine 1.36, GFR 58  INR 1.1 today   PLAN  Imaging reviewed by Dr. Estanislado Pandy, [place] small and stable. Will schedule the diagnostic cerebral angiogram on Monday 02/13/22. NPO Monday MN  Hold Metformin Sunday and  Monday  Ancef 2g ordered to be given in NIR  RF mildly increased today, NS infusion at 75 mL/hr x 24 hours starting Sun 8 am    Pt A/O x 2 only, case discussed with patient and his spouse.   Risks and benefits of cerebral angiogram with intervention were discussed with the patient and his wife  including, but not limited to bleeding, infection, vascular injury, contrast induced renal failure, stroke or even death.  This interventional procedure involves the use of X-rays and because of the nature of the planned procedure, it is possible that we will have prolonged use of X-ray fluoroscopy.  Potential radiation risks to you include (but are not limited to) the following: - A slightly elevated risk for cancer  several years later in life. This risk is typically less than 0.5% percent. This risk is low in comparison to the normal incidence of human cancer, which is 33% for women and 50% for men according to the [place]. - Radiation induced injury can include skin redness, resembling a rash, tissue breakdown / ulcers and hair loss (which can be temporary or permanent).   The likelihood of either of these occurring depends on the difficulty of the procedure and whether you are sensitive to radiation due to previous procedures, disease, or genetic conditions.   IF your procedure requires a prolonged use of radiation, you will be notified and given written instructions for further action.  It is your responsibility to monitor the irradiated area for the 2 weeks following the procedure and to notify your physician if you are concerned that you have suffered a radiation induced injury.    All of the patient and his spouse's questions were answered, patient is agreeable to proceed.  Consent signed and in IR.     Thank you for this interesting consult.  I greatly enjoyed meeting [place] and look forward to participating in their care.  A copy of this report was sent to  the requesting provider on this date.  Electronically Signed: Tera Mater, PA-C 02/10/2022, 11:49 AM   I spent a total of  40 Minutes   in face to face in clinical consultation, greater than 50% of which was counseling/coordinating care for diagnostic cerebral angiogram.   This chart was dictated using voice recognition software.  Despite best efforts to proofread,  errors can occur which can change the documentation meaning.

## 2022-02-10 NOTE — H&P (Signed)
[place]   PATIENT NAME: Fernando Rhodes    MR#:  [id number]  DATE OF BIRTH:  1956-09-07  DATE OF ADMISSION:  02/10/2022  PRIMARY CARE PHYSICIAN: London Pepper, MD   Patient is coming from: Home  REQUESTING/REFERRING PHYSICIAN: Clementeen Graham, MD  CHIEF COMPLAINT:   Chief Complaint  Patient presents with   Code Stroke    HISTORY OF PRESENT ILLNESS:  Fernando Rhodes is a 66 y.o. Caucasian male with medical history significant for type 2 diabetes mellitus, hypertension, DVT and stage IIIa chronic kidney disease, who presented to the emergency room with acute onset of suspected seizure.  The patient's wife found the patient rigid and stiff with left facial droop and shaking all over with froth coming out of his mouth and his eyes rolled back.  No urinary or stool incontinence or tongue bites.  The patient 's wife was called 911 due to sudden onset.  He was unresponsive and confused with suspected postictal phase his seizures were aborted.  No paresthesias or focal muscle weakness.  He was having persistent left facial droop during my interview.  He denies any fever or chills.  No nausea or vomiting or abdominal pain.  No tinnitus or vertigo.  No chest pain or palpitations.  No cough or wheezing or dyspnea.  No dysuria, oliguria or hematuria or flank pain.  No bleeding diathesis.  ED Course: Upon presenting to the emergency room, BP was 143/92 pulse 70 of 89% on room air and later 95%- 99% on 2 L of O2 by nasal cannula.  Labs revealed borderline hypokalemia 3.4 with glucose of 200 CO2 16 and BUN 15 and creatinine 1.36.  CBC was within normal.  Influenza antigens and COVID-19 second back negative. EKG as reviewed by me : EKG showed normal sinus rhythm with a rate of 76 with left atrial enlargement, abnormal R wave progression Imaging: CTA of the head and neck revealed the following: 1. No emergent large vessel occlusion or high-grade stenosis of the intracranial  arteries. 2. No dissection, aneurysm or hemodynamically significant stenosis of the carotid or vertebral arteries.  The patient was given Eliquis reversal with Andexxa and Labetalol 5 mg IV, 1000 mg IV Keppra loading dose followed by 500 mg IV maintenance.  The patient will be admitted to a progressive unit bed for further evaluation and management.   PAST MEDICAL HISTORY:   Past Medical History:  Diagnosis Date   Diabetes mellitus without complication ([place])    Hypertension    Renal insufficiency 06/01/2020  -History of DVT     PAST SURGICAL HISTORY:   Past Surgical History:  Procedure Laterality Date   ABDOMINAL SURGERY     IR US GUIDE BX ASP/DRAIN  06/01/2020    SOCIAL HISTORY:   Social History   Tobacco Use   Smoking status: Never   Smokeless tobacco: Never  Substance Use Topics   Alcohol use: No    FAMILY HISTORY:    Positive for cancer in his mother.  DRUG ALLERGIES:  No Known Allergies  REVIEW OF SYSTEMS:   ROS As per history of present illness. All pertinent systems were reviewed above. Constitutional, HEENT, cardiovascular, respiratory, GI, GU, musculoskeletal, neuro, psychiatric, endocrine, integumentary and hematologic systems were reviewed and are otherwise negative/unremarkable except for positive findings mentioned above in the HPI.   MEDICATIONS AT HOME:   Prior to Admission medications   Medication Sig Start Date End Date Taking? Authorizing Provider  acetaminophen (TYLENOL) 500 MG  tablet Take 1,000 mg by mouth every 6 (six) hours as needed for headache (pain).   Yes [provider]  ELIQUIS 5 MG TABS tablet Take 5 mg by mouth 2 (two) times daily. 12/12/21  Yes [provider]  FERROUS SULFATE PO Take 1 tablet by mouth daily. Unsure of strength   Yes [provider]  metFORMIN (GLUCOPHAGE) 500 MG tablet Take 1 tablet (500 mg total) by mouth 2 (two) times daily with a meal. 06/04/20 02/10/22 Yes Gonfa, Charlesetta Ivory, MD   Multiple Vitamins-Minerals (MULTIVITAMIN GUMMIES ADULT PO) Take 2 tablets by mouth daily. Take 2 gummies daily   Yes [provider]  simvastatin (ZOCOR) 20 MG tablet Take 20 mg by mouth at bedtime.    Yes [provider]  allopurinol (ZYLOPRIM) 100 MG tablet Take 1 tablet (100 mg total) by mouth daily. Patient not taking: Reported on 07/27/2020 05/25/20   Newt Minion, MD  APIXABAN Arne Cleveland) VTE STARTER PACK (10MG  AND 5MG ) Take as directed on package: start with two-5mg  tablets twice daily for 7 days. On day 8, switch to one-5mg  tablet twice daily. Patient not taking: Reported on 02/10/2022 06/04/20   Mercy Riding, MD  lisinopril-hydrochlorothiazide (PRINZIDE,ZESTORETIC) 20-12.5 MG per tablet TAKE ONE TABLET BY MOUTH EVERY DAY Patient not taking: No sig reported 07/30/11   Denita Lung, MD      VITAL SIGNS:  Blood pressure (!) 139/92, pulse 70, temperature (!) 97.3 F (36.3 C), temperature source Oral, resp. rate 13, weight 76.6 kg, SpO2 97 %.  PHYSICAL EXAMINATION:  Physical Exam  GENERAL:  66 y.o.-year-old Caucasian male patient lying in the bed with no acute distress.  He was mildly somnolent but easily arousable and cooperative. EYES: Pupils equal, round, reactive to light and accommodation. No scleral icterus. Extraocular muscles intact.  HEENT: Head atraumatic, normocephalic. Oropharynx and nasopharynx clear.  NECK:  Supple, no jugular venous distention. No thyroid enlargement, no tenderness.  LUNGS: Normal breath sounds bilaterally, no wheezing, rales,rhonchi or crepitation. No use of accessory muscles of respiration.  CARDIOVASCULAR: Regular rate and rhythm, S1, S2 normal. No murmurs, rubs, or gallops.  ABDOMEN: Soft, nondistended, nontender. Bowel sounds present. No organomegaly or mass.  EXTREMITIES: No pedal edema, cyanosis, or clubbing.  NEUROLOGIC: Cranial nerves II through XII are intact except for left facial droop. Muscle strength 5/5 in all extremities.  Sensation intact. Gait not checked.  PSYCHIATRIC: The patient is alert and oriented x 3.  Normal affect and good eye contact. SKIN: No obvious rash, lesion, or ulcer.   LABORATORY PANEL:   CBC Recent Labs  Lab 02/10/22 0139 02/10/22 0148  WBC 7.9  --   HGB 14.7 13.9  HCT 42.8 41.0  PLT 200  --    ------------------------------------------------------------------------------------------------------------------  Chemistries  Recent Labs  Lab 02/10/22 0139 02/10/22 0148  NA 135 139  K 3.4* 3.5  CL 103 105  CO2 16*  --   GLUCOSE 200* 188*  BUN 15 18  CREATININE 1.36* 1.40*  CALCIUM 8.6*  --   AST 24  --   ALT 15  --   ALKPHOS 78  --   BILITOT 0.6  --    ------------------------------------------------------------------------------------------------------------------  Cardiac Enzymes No results for input(s): TROPONINI in the last 168 hours. ------------------------------------------------------------------------------------------------------------------  RADIOLOGY:  CT HEAD CODE STROKE WO CONTRAST  Result Date: 02/10/2022 CLINICAL DATA:  Code stroke. EXAM: CT HEAD WITHOUT CONTRAST TECHNIQUE: Contiguous axial images were obtained from the base of the skull through the vertex  without intravenous contrast. RADIATION DOSE REDUCTION: This exam was performed according to the departmental dose-optimization program which includes automated exposure control, adjustment of the mA and/or kV according to patient size and/or use of iterative reconstruction technique. COMPARISON:  None. 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 At the superomedial aspect of the area of encephalomalacia, there is a small focus of subarachnoid hyperdensity. No mass effect. Vascular: No abnormal hyperdensity of the major intracranial arteries or dural venous sinuses. No intracranial atherosclerosis. Skull: The visualized skull base, calvarium and extracranial soft tissues are normal.  Sinuses/Orbits: No fluid levels or advanced mucosal thickening of the visualized paranasal sinuses. No mastoid or middle ear effusion. The orbits are normal. IMPRESSION: 1. Small focus of subarachnoid hyperdensity at the superomedial aspect of the area of encephalomalacia, favored to be a small amount of subarachnoid hemorrhage. 2. 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 Critical Value/emergent results were called by telephone at the time of interpretation on 02/10/2022 at 1:54 am to provider Lynnae Sandhoff, who verbally acknowledged these results. Electronically Signed   By: Ulyses Jarred M.D.   On: 02/10/2022 01:59   CT ANGIO HEAD NECK W WO CM (CODE STROKE)  Result Date: 02/10/2022 CLINICAL DATA:  Stroke follow-up EXAM: CT ANGIOGRAPHY HEAD AND NECK TECHNIQUE: Multidetector CT imaging of the head and neck was performed using the standard protocol during bolus administration of intravenous contrast. Multiplanar CT image reconstructions and MIPs were obtained to evaluate the vascular anatomy. Carotid stenosis measurements (when applicable) are obtained utilizing NASCET criteria, using the distal internal carotid diameter as the denominator. RADIATION DOSE REDUCTION: This exam was performed according to the departmental dose-optimization program which includes automated exposure control, adjustment of the mA and/or kV according to patient size and/or use of iterative reconstruction technique. CONTRAST:  92mL OMNIPAQUE IOHEXOL 350 MG/ML SOLN COMPARISON:  None. FINDINGS: CTA NECK FINDINGS SKELETON: There is no bony spinal canal stenosis. No lytic or blastic lesion. OTHER NECK: Normal pharynx, larynx and major salivary glands. No cervical lymphadenopathy. Unremarkable thyroid gland. UPPER CHEST: No pneumothorax or pleural effusion. No nodules or masses. AORTIC ARCH: Below the field of view. The visualized proximal subclavian arteries are widely patent. RIGHT CAROTID SYSTEM: Normal without aneurysm, dissection or stenosis.  LEFT CAROTID SYSTEM: Normal without aneurysm, dissection or stenosis. VERTEBRAL ARTERIES: Left dominant configuration. Both origins are clearly patent. There is no dissection, occlusion or flow-limiting stenosis to the skull base (V1-V3 segments). CTA HEAD FINDINGS POSTERIOR CIRCULATION: --Vertebral arteries: Normal V4 segments. --Inferior cerebellar arteries: Normal. --Basilar artery: Normal. --Superior cerebellar arteries: Normal. --Posterior cerebral arteries (PCA): Normal. ANTERIOR CIRCULATION: --Intracranial internal carotid arteries: Normal. --Anterior cerebral arteries (ACA): Normal. Both A1 segments are present. Patent anterior communicating artery (a-comm). --Middle cerebral arteries (MCA): Normal. VENOUS SINUSES: As permitted by contrast timing, patent. ANATOMIC VARIANTS: None Review of the MIP images confirms the above findings. IMPRESSION: 1. No emergent large vessel occlusion or high-grade stenosis of the intracranial arteries. 2. No dissection, aneurysm or hemodynamically significant stenosis of the carotid or vertebral arteries. Electronically Signed   By: Ulyses Jarred M.D.   On: 02/10/2022 03:10      IMPRESSION AND PLAN:  Assessment and Plan: * Seizure [facility]) - The patient be admitted to a progressive unit bed. - We will follow neurochecks every 4 hours for 24 hours. -The patient received Andexxa for reversal of Eliquis. - We will place him on seizures precautions. - As needed IV Ativan will be utilized. - He was loaded with IV Keppra and will  be continued on maintenance 500 mg IV every 12 hours. - Neurology consult was obtained by Dr. Theda Sers   Acute CVA (cerebrovascular accident) [facility]) - We will follow neurochecks every 4 hours for 24 hours. - PT/OT and ST consults will be obtained. - Brain MRI without contrast will be obtained. - 2D echo with bubble study was ordered. - We will continue statin therapy and check fasting lipids in AM. - Eliquis will obviously be held  off.   Subarachnoid hemorrhage - We will repeat head CT scan 6 hours from the first CT. - Neurology will be following with [place]. - Eliquis was stopped. - Andexxa was given for reversal of Eliquis. - The patient will be on seizures precautions as above. - He was loaded with IV Keppra followed by maintenance infusion.    Dyslipidemia The patient will be continued on statin therapy.  Gout - We will continue allopurinol.  Type 2 diabetes mellitus with chronic kidney disease, without long-term current use of insulin ([place]) - The patient will be placed on supplement coverage with NovoLog. - We will hold off metformin.   DVT prophylaxis: Lovenox.  Advanced Care Planning:  Code Status: full code.  Family Communication:  The plan of care was discussed in details with the patient (and family). I answered all questions. The patient agreed to proceed with the above mentioned plan. Further management will depend upon hospital course. Disposition Plan: Back to previous home environment Consults called: none.  All the records are reviewed and case discussed with ED provider.  Status is: Inpatient  At the time of the admission, it appears that the appropriate admission status for this patient is inpatient.  This is judged to be reasonable and necessary in order to provide the required intensity of service to ensure the patient's safety given the presenting symptoms, physical exam findings and initial radiographic and laboratory data in the context of comorbid conditions.  The patient requires inpatient status due to high intensity of service, high risk of further deterioration and high frequency of surveillance required.  I certify that at the time of admission, it is my clinical judgment that the patient will require inpatient hospital care extending more than 2 midnights.                            Dispo: The patient is from: Home              Anticipated d/c is to: Home              Patient  currently is not medically stable to d/c.              Difficult to place patient: No  Christel Mormon M.D on 02/10/2022 at 6:41 AM  [facility]   From 7 PM-7 AM, contact night-coverage [email or web address]  CC: Primary care physician; London Pepper, MD

## 2022-02-10 NOTE — Assessment & Plan Note (Addendum)
Baseline creatinine 1.2-1.6.  Creatinine on admission 1.36.  Creatinine improved to 1.03 at time of discharge. ?

## 2022-02-10 NOTE — ED Triage Notes (Signed)
Pt bib gcems as code stroke. LKW 2130 last night. Pt woke up from sleep at approx 1am because a cramp in his leg and per wife he had full body shaking with snoring respirations. On EMS arrival, L sided facial droop, L sided weakness esp in hand. On arrival pt drowsy but able to answer some questions. Pt on eliquis for hx of blood clot.  ? ?BP 170 palp, HR 94, Spo2 93% RA ?

## 2022-02-10 NOTE — Assessment & Plan Note (Addendum)
Patient presenting to ED via EMS after being found unresponsive by spouse with seizure-like activity.  No signs of tongue trauma, and no reported bowel/bladder incontinence.  Patient with history of CVA.  No signs of infectious etiology.  Neurology was consulted and followed during hospital course.  Loaded with IV Keppra.  EEG with no seizure or epileptiform discharges noted throughout the reading.  Continue Keppra 500 mg p.o. twice daily.  Outpatient follow-up with neurology in 4 weeks.  ? ?

## 2022-02-10 NOTE — Consult Note (Addendum)
Neurology Consult H&P ? ?[name] ?MR# [id number] ?02/10/2022 ? ?CC: subarachnoid hemorrhage ? ?History is obtained from: EMS and chart. ? ?HPI: Fernando Rhodes is a 66 y.o. male PMHx as reviewed below, arrives as code stroke. His LKW 2130 last night 02/09/2022. He woke up from sleep ~1am because a cramp in his leg and per wife he had full body shaking with snoring respirations. On EMS arrival, he was noted to have left sided facial droop and left sided weakness especially in left hand. On arrival he was drowsy but able to answer some questions. Per EMS on eliquis for hx of blood clot.  ? ?Wife stated that he was sleeping and around 0100 he woke with cramps in his legs and he extended his arms eyes were briefly open and started to foam at the mouth.  The event lasted less than 1 minute and he went back to sleep and was snoring heavily.  His wife states he is on apixaban twice daily but she is uncertain of the dose. ? ?His wife states he has a history of blood clot in his foot and a pulmonary embolism and recently had a liver biopsy.  She states she is not a smoker or drinker.  He has had leg cramps in the past. ? ?LKW: 2130 ?tNK given: No SAH ?IR Thrombectomy No, not indicated ?Modified Rankin Scale: 0-Completely asymptomatic and back to baseline post- stroke ?NIHSS: 5 ? ?ROS: Unable to assess due to encephalopathy. ? ?Past Medical History:  ?Diagnosis Date  ? Diabetes mellitus without complication ([place])   ? Hypertension   ? Renal insufficiency 06/01/2020  ? ?No family history on file. ? ?Social History:  reports that he has never smoked. He has never used smokeless tobacco. He reports that he does not drink alcohol and does not use drugs. ? ? ?Prior to Admission medications   ?Medication Sig Start Date End Date Taking? Authorizing Provider  ?acetaminophen (TYLENOL) 500 MG tablet Take 1,000 mg by mouth every 6 (six) hours as needed for headache (pain). ?Patient not taking: Reported on 07/27/2020     [provider]  ?allopurinol (ZYLOPRIM) 100 MG tablet Take 1 tablet (100 mg total) by mouth daily. ?Patient not taking: Reported on 07/27/2020 05/25/20   Newt Minion, MD  ?APIXABAN Arne [place]) VTE STARTER PACK (10MG  AND 5MG ) Take as directed on package: start with two-5mg  tablets twice daily for 7 days. On day 8, switch to one-5mg  tablet twice daily. 06/04/20   Mercy Riding, MD  ?Ascorbic Acid (VITAMIN C) 1000 MG tablet Take 1,000 mg by mouth daily. ?Patient not taking: Reported on 06/23/2020    [provider]  ?CALCIUM-MAGNESIUM-VITAMIN D PO Take 1 tablet by mouth daily. ?Patient not taking: Reported on 06/23/2020    [provider]  ?lisinopril-hydrochlorothiazide (PRINZIDE,ZESTORETIC) 20-12.5 MG per tablet TAKE ONE TABLET BY MOUTH EVERY DAY ?Patient not taking: Reported on 06/23/2020 07/30/11   Denita Lung, MD  ?metFORMIN (GLUCOPHAGE) 500 MG tablet Take 1 tablet (500 mg total) by mouth 2 (two) times daily with a meal. 06/04/20 12/01/20  Mercy Riding, MD  ?Multiple Vitamins-Minerals (MULTIVITAMIN GUMMIES ADULT PO) Take by mouth. Take 2 gummies daily    [provider]  ?simvastatin (ZOCOR) 20 MG tablet Take 20 mg by mouth at bedtime.     [provider]  ?vitamin B-12 (CYANOCOBALAMIN) 1000 MCG tablet Take 1,000 mcg by mouth daily.  ?Patient not taking: Reported on 07/27/2020    [provider]  ? ? ?  Exam: ?Current vital signs: ?BP (!) 143/92   Pulse 82   Resp 18   Wt 76.6 kg   SpO2 (!) 89%   BMI 27.26 kg/m?  ? ?Physical Exam  ?Constitutional: Appears well-developed and well-nourished.  ?Psych: Affect appropriate to situation ?Eyes: No scleral injection ?HENT: No OP obstruction. ?Head: Normocephalic.  ?Cardiovascular: Normal rate and regular rhythm.  ?Respiratory: Effort normal, symmetric excursions bilaterally, no audible wheezing. ?GI: Soft.  No distension. There is no tenderness.  ?Skin: WDI ? ?Neuro: ?Mental Status: ?Patient is awake, alert, oriented to  person, place and situation. ?Patient is not able to give a clear and coherent history. ?Speech mild impaired fluency, intact comprehension and repetition. ?No signs of aphasia or neglect. ?Visual Fields are full. Pupils are equal, round, and reactive to light. ?EOMI without ptosis or diplopia.  ?Facial sensation is symmetric to temperature ?Left facial droop.  ?Hearing is intact to voice. ?Uvula midline and palate elevates symmetrically. ?Shoulder shrug is symmetric. ?Tongue is midline without atrophy or fasciculations.  ?Tone is normal. Bulk is normal. 5/5 strength was present in all four extremities. ?Sensation is symmetric to light touch and temperature in the arms and legs. ?Deep Tendon Reflexes: 2+ and symmetric in the biceps and patellae. ?Toes are mute. ?FNF and HKS are intact bilaterally. ?Gait - Deferred ? ?I have reviewed labs in epic and the pertinent results are: ?CBG 191 ? ?I have reviewed the images obtained: ?NCT head showed small focus of subarachnoid hyperdensity at the superomedial aspect area of encephalomalacia, favored to be a small amount ?of subarachnoid hemorrhage. 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 ?CTA head and neck showed no emergent large vessel occlusion or high-grade stenosis of the intracranial arteries. No dissection, aneurysm or hemodynamically significant stenosis of the carotid or vertebral arteries.  Venous sinuses as permitted by contrast timing, patent. ? ?Assessment: Fernando Rhodes is a 66 y.o. male PMHx DVT and PE on apixaban woke with seizure found to have SAH.  He was not a candidate for IV thrombolytic due to Eliquis and SAH.  There is no intervenable LVO.  His mentation is improving he is able to answer questions and follow commands consistently currently does not seem to be seizing. ? 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 ?Plan: ?- Loaded with levetiracetam 1,000mg  STAT - Ordered. ?-  Continue levetiracetam maintenance 500 mg twice daily. ?- Elevate head of bed keep head midline. ?- Blood pressure: MAP >65 SBP <140: ? ?- Consult neurosurgery. ?- Levetiracetam 500mg  two times daily for now - Ordered. ?- Repeat CT head in 6 hours (or sooner if clinical worsening). ?- Echocardiogram. ?- MRI brain without contrast when able. ? ?- Routine EEG - Ordered ?- Lorazepam 2mg  IV as needed seizure lasting more than 5 minutes and please notify neurology if administered. ?- Precautions: seizure, aspiration. ? ?Stroke neurology will continue to follow. ? ?This patient is critically ill and at significant risk of neurological worsening, death and care requires constant monitoring of vital signs, hemodynamics,respiratory and cardiac monitoring, neurological assessment, discussion with family, other specialists and medical decision making of high complexity. I spent 75 minutes of neurocritical care time  in the care of  this patient. This was time spent independent of any time provided by nurse practitioner or PA. ? ?Electronically signed by:  ?Lynnae Sandhoff, MD ?Page: [phone] ?02/10/2022, 2:08 AM ? ?If 7pm- 7am, please page neurology on call as listed in [place]. ? ?

## 2022-02-11 LAB — BASIC METABOLIC PANEL
Anion gap: 10 (ref 5–15)
BUN: 11 mg/dL (ref 8–23)
CO2: 24 mmol/L (ref 22–32)
Calcium: 8.3 mg/dL — ABNORMAL LOW (ref 8.9–10.3)
Chloride: 105 mmol/L (ref 98–111)
Creatinine, Ser: 1.03 mg/dL (ref 0.61–1.24)
GFR, Estimated: >60 mL/min (ref >60)
Glucose, Bld: 109 mg/dL — ABNORMAL HIGH (ref 70–99)
Potassium: 3.3 mmol/L — ABNORMAL LOW (ref 3.5–5.1)
Sodium: 139 mmol/L (ref 135–145)

## 2022-02-11 LAB — GLUCOSE, CAPILLARY
Glucose-Capillary: 170 mg/dL — ABNORMAL HIGH (ref 70–99)
Glucose-Capillary: 70 mg/dL (ref 70–99)
Glucose-Capillary: 185 mg/dL — ABNORMAL HIGH (ref 70–99)
Glucose-Capillary: 124 mg/dL — ABNORMAL HIGH (ref 70–99)

## 2022-02-11 LAB — LIPID PANEL
Cholesterol: 141 mg/dL (ref 0–200)
HDL: 51 mg/dL (ref >40)
LDL Cholesterol: 71 mg/dL (ref 0–99)
Total CHOL/HDL Ratio: 2.8 RATIO
Triglycerides: 97 mg/dL (ref <150)
VLDL: 19 mg/dL (ref 0–40)

## 2022-02-11 LAB — MAGNESIUM: Magnesium: 1.9 mg/dL (ref 1.7–2.4)

## 2022-02-11 LAB — HIV ANTIBODY (ROUTINE TESTING W REFLEX): HIV Screen 4th Generation wRfx: NONREACTIVE

## 2022-02-11 MED ORDER — MAGNESIUM SULFATE 2 GM/50ML IV SOLN
2.0000 g | Freq: Once | INTRAVENOUS | Status: AC
  Administered 2022-02-11: 2 g via INTRAVENOUS
  Filled 2022-02-11: qty 50

## 2022-02-11 MED ORDER — DABIGATRAN ETEXILATE MESYLATE 150 MG PO CAPS
150.0000 mg | ORAL_CAPSULE | Freq: Two times a day (BID) | ORAL | Status: DC
Start: 2022-02-11 — End: 2022-02-13
  Administered 2022-02-11 – 2022-02-13 (×4): 150 mg via ORAL
  Filled 2022-02-11 (×5): qty 1

## 2022-02-11 MED ORDER — POTASSIUM CHLORIDE CRYS ER 20 MEQ PO TBCR
40.0000 meq | EXTENDED_RELEASE_TABLET | ORAL | Status: AC
  Administered 2022-02-11 (×2): 40 meq via ORAL
  Filled 2022-02-11 (×2): qty 2

## 2022-02-11 NOTE — Progress Notes (Signed)
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  Last known normal 2130 on 02/09/2022.  No reported tongue biting or bladder/bowel incontinence.  Patient was confused on arrival, likely due to postictal state.  He was noted to have persistent left facial droop.  He denied fever/chills, no nausea/vomiting/diarrhea, no abdominal pain, no chest pain, no shortness of breath, no palpitations, no urinary complaints.  In the ED, temperature 97.3 F, HR 82, RR 18, BP 143/92, SPO2 89% on room air.  Sodium 135, potassium 3.4, chloride 103, CO2 16, glucose 200, BUN 15, creatinine 1.36, AST 24, ALT 15, total bilirubin 0.6.  WBC 7.9, hemoglobin 14.7, platelets 200.  COVID-19 PCR negative.  Influenza A/B PCR negative.  Hemoglobin A1c 5.7.  CT head without contrast with small focus of subarachnoid hyperdensity at the superior medial aspect of encephalomalacia concerning for subarachnoid hemorrhage; old right insular/opercular infarct.  Neurology was consulted.  EDP ordered Eliquis reversal, labetalol 5 mg IV, 1000 mg IV Keppra loading dose.  Hospitalist service consulted for further evaluation and management of acute subarachnoid hemorrhage with seizure.     Assessment & Plan:   Assessment and Plan: * Seizure [facility]) Patient presenting to ED via EMS after being found unresponsive by spouse with seizure-like activity.  No signs of tongue trauma, and no reported bowel/bladder incontinence.  Patient with history of CVA.  No signs of infectious etiology.  Loaded with IV Keppra.  EEG with no seizure or epileptiform discharges noted throughout the  reading. --Neurology following, appreciate assistance --Keppra 500 mg BID --Ativan 2 mg IV PRN for seizure lasting >43mn --Seizure/aspiration precautions   Acute CVA (cerebrovascular accident) ([facility] Patient presenting to ED following seizure activity with left-sided facial droop which is new.  CT head with small focus of subarachnoid hyperdensity superior medial aspect of the area of encephalomalacia favored to be a small amount of subarachnoid hemorrhage, old right insular/opercular infarct.  CTA head/neck with no emergent large vessel occlusion or high-grade stenosis of the intracranial arteries, no dissection/aneurysm or hemodynamically significant stenosis of the carotid/vertebral arteries.  Repeat CT head stable appearance of right MCA infarct and encephalomalacia.  MR brain without contrast 3/3 with small acute left frontal cortical/subcortical infarct and bilateral punctate cerebellar acute infarcts with no hemorrhage or mass effect; old right frontal infarct with findings of chronic small vessel disease.  TTE with LVEF 60 to 65%, no regional wall motion abnormalities, mild MR, no aortic stenosis, IVC normal in size, bubble study attempted but nondiagnostic due to poor echo window.  Hemoglobin A1c 5.7.  LDL 71. --Neurology following, appreciate assistance --PT/SLP with no recommendations --OT consult: Pending --Simvastatin 20 mg p.o. daily    Subarachnoid hemorrhage ruled out CT head on admission with small focus of subarachnoid hyperdensity superior medial aspect of the area of encephalomalacia favored to be small amount of subarachnoid hemorrhage.  Complicated by history of PE/DVT currently on Eliquis.  Patient received Eliquis reversal by EDP.  CTA head/neck with no dissection/aneurysm noted.  Repeat CT head and MRI brain with no findings of hemorrhage; suspect initial CT results artifact.     Dyslipidemia Lipid panel total cholesterol 141,  HDL 51, LDL 71, triglycerides  97. --Continue home simvastatin 20 mg p.o. daily  Type 2 diabetes mellitus with chronic kidney disease, without long-term current use of insulin (HCC) Hemoglobin A1c 5.7, well controlled.  Home regimen includes metformin 500 mg p.o. twice daily. --Hold oral hypoglycemics while inpatient --SSI for coverage --CBGs qAC/HS   Hx pulmonary embolism, LLE DVT Patient with past history of saddle pulmonary embolism and left lower extremity DVT in June 2021.  Has been on Eliquis since.  Eliquis has been reversed by EDP for concerns of subarachnoid hemorrhage as above. 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 --Neurology to evaluate whether this is an Eliquis failure and can likely restart anticoagulation regimen given continued DVT LLE  Stage 3a chronic kidney disease (CKD) (HCC) Baseline creatinine 1.2-1.6.  Creatinine on admission 1.36. --Cr 1.36>1.40>1.03 --Avoid nephrotoxins, renal dose all medications --Holding metformin as above --BMP daily    DVT prophylaxis: SCD's Start: 02/10/22 0528    Code Status: Full Code Family Communication: Updated patient's family present at bedside this morning  Disposition Plan:  Level of care: Progressive Status is: Inpatient Remains inpatient appropriate because: Awaiting OT evaluation, awaiting further neurology recommendations    Consultants:  Neurology Neuro interventional radiology  Procedures:  TTE Vascular duplex ultrasound lower extremities  Antimicrobials:  None   Subjective: Patient seen examined bedside, resting comfortably.  Family present.  No specific complaints this morning.  On repeat CT head and MR brain no findings of hemorrhage, likely concern for subarachnoid hemorrhage on initial head CT artifact.  Discussed with neurology, Dr. Erlinda Hong; who will discuss with patient today regarding  restart of anticoagulation, may be treatment failure with Eliquis although given his continued chronic DVT left lower extremity.  Denies headache, no visual changes, no chest pain, no palpitations, no shortness of breath, no abdominal pain, no fever/chills/night sweats, no nausea/vomiting/diarrhea.  No acute events overnight per nursing staff.  Objective: Vitals:   02/10/22 2310 02/11/22 0422 02/11/22 0731 02/11/22 1134  BP: 122/75 128/73 (!) 151/89 125/76  Pulse: 72 67 78 72  Resp: '16 18 14 18  '$ Temp: 98.3 F (36.8 C) 98.4 F (36.9 C) 98 F (36.7 C) 98.7 F (37.1 C)  TempSrc: Oral Oral Oral Oral  SpO2: 95% 96% 95% 97%  Weight:      Height:        Intake/Output Summary (Last 24 hours) at 02/11/2022 1348 Last data filed at 02/11/2022 0900 Gross per 24 hour  Intake 2695.26 ml  Output --  Net 2695.26 ml   Filed Weights   02/10/22 0100 02/10/22 1510  Weight: 76.6 kg 72.5 kg    Examination:  Physical Exam: GEN: NAD, alert and oriented x 3, chronically ill in appearance HEENT: NCAT, PERRL, EOMI, sclera clear, MMM PULM: CTAB w/o wheezes/crackles, normal respiratory effort, on room air CV: RRR w/o M/G/R GI: abd soft, NTND, NABS, no R/G/M MSK: no peripheral edema, lower extremity muscle strength slightly decreased 4/5, otherwise globally intact.   NEURO: Left facial droop noted, otherwise CN II-XII intact, sensation to light touch intact PSYCH: normal mood/affect Integumentary: dry/intact, no rashes or wounds    Data Reviewed: I have personally reviewed following labs and imaging studies  CBC: Recent Labs  Lab 02/10/22 0139 02/10/22 0148  WBC 7.9  --   NEUTROABS 3.7  --   HGB 14.7 13.9  HCT 42.8 41.0  MCV  95.7  --   PLT 200  --    Basic Metabolic Panel: Recent Labs  Lab 02/10/22 0139 02/10/22 0148 02/11/22 0430  NA 135 139 139  K 3.4* 3.5 3.3*  CL 103 105 105  CO2 16*  --  24  GLUCOSE 200* 188* 109*  BUN '15 18 11  '$ CREATININE 1.36* 1.40* 1.03  CALCIUM 8.6*   --  8.3*  MG  --   --  1.9   GFR: Estimated Creatinine Clearance: 73.4 mL/min (by C-G formula based on SCr of 1.03 mg/dL). Liver Function Tests: Recent Labs  Lab 02/10/22 0139  AST 24  ALT 15  ALKPHOS 78  BILITOT 0.6  PROT 6.4*  ALBUMIN 3.6   No results for input(s): LIPASE, AMYLASE in the last 168 hours. No results for input(s): AMMONIA in the last 168 hours. Coagulation Profile: Recent Labs  Lab 02/10/22 0139  INR 1.1   Cardiac Enzymes: No results for input(s): CKTOTAL, CKMB, CKMBINDEX, TROPONINI in the last 168 hours. BNP (last 3 results) No results for input(s): PROBNP in the last 8760 hours. HbA1C: Recent Labs    02/10/22 0139  HGBA1C 5.7*   CBG: Recent Labs  Lab 02/10/22 1206 02/10/22 1656 02/10/22 2003 02/11/22 0859 02/11/22 1135  GLUCAP 115* 108* 111* 170* 70   Lipid Profile: Recent Labs    02/11/22 0430  CHOL 141  HDL 51  LDLCALC 71  TRIG 97  CHOLHDL 2.8   Thyroid Function Tests: No results for input(s): TSH, T4TOTAL, FREET4, T3FREE, THYROIDAB in the last 72 hours. Anemia Panel: No results for input(s): VITAMINB12, FOLATE, FERRITIN, TIBC, IRON, RETICCTPCT in the last 72 hours. Sepsis Labs: No results for input(s): PROCALCITON, LATICACIDVEN in the last 168 hours.  Recent Results (from the past 240 hour(s))  Resp Panel by RT-PCR (Flu A&B, Covid) Nasopharyngeal Swab     Status: None   Collection Time: 02/10/22  2:32 AM   Specimen: Nasopharyngeal Swab; Nasopharyngeal(NP) swabs in vial transport medium  Result Value Ref Range Status   SARS Coronavirus 2 by RT PCR NEGATIVE NEGATIVE Final    Comment: (NOTE) SARS-CoV-2 target nucleic acids are NOT DETECTED.  The SARS-CoV-2 RNA is generally detectable in upper respiratory specimens during the acute phase of infection. The lowest concentration of SARS-CoV-2 viral copies this assay can detect is 138 copies/mL. A negative result does not preclude SARS-Cov-2 infection and should not be used as the  sole basis for treatment or other patient management decisions. A negative result may occur with  improper specimen collection/handling, submission of specimen other than nasopharyngeal swab, presence of viral mutation(s) within the areas targeted by this assay, and inadequate number of viral copies(<138 copies/mL). A negative result must be combined with clinical observations, patient history, and epidemiological information. The expected result is Negative.  Fact Sheet for Patients:  EntrepreneurPulse.com.au  Fact Sheet for Healthcare Providers:  IncredibleEmployment.be  This test is no t yet approved or cleared by the [place] FDA and  has been authorized for detection and/or diagnosis of SARS-CoV-2 by FDA under an Emergency Use Authorization (EUA). This EUA will remain  in effect (meaning this test can be used) for the duration of the COVID-19 declaration under Section 564(b)(1) of the Act, 21 U.S.C.section 360bbb-3(b)(1), unless the authorization is terminated  or revoked sooner.       Influenza A by PCR NEGATIVE NEGATIVE Final   Influenza B by PCR NEGATIVE NEGATIVE Final    Comment: (NOTE) The Xpert Xpress SARS-CoV-2/FLU/RSV plus assay  is intended as an aid in the diagnosis of influenza from Nasopharyngeal swab specimens and should not be used as a sole basis for treatment. Nasal washings and aspirates are unacceptable for Xpert Xpress SARS-CoV-2/FLU/RSV testing.  Fact Sheet for Patients: EntrepreneurPulse.com.au  Fact Sheet for Healthcare Providers: IncredibleEmployment.be  This test is not yet approved or cleared by the [place] FDA and has been authorized for detection and/or diagnosis of SARS-CoV-2 by FDA under an Emergency Use Authorization (EUA). This EUA will remain in effect (meaning this test can be used) for the duration of the COVID-19 declaration under Section 564(b)(1) of the  Act, 21 U.S.C. section 360bbb-3(b)(1), unless the authorization is terminated or revoked.  Performed at [facility], [address] [place], [place] [address]          Radiology Studies: CT HEAD WO CONTRAST (5MM)  Result Date: 02/10/2022 CLINICAL DATA:  Subarachnoid hemorrhage. EXAM: CT HEAD WITHOUT CONTRAST TECHNIQUE: Contiguous axial images were obtained from the base of the skull through the vertex without intravenous contrast. RADIATION DOSE REDUCTION: This exam was performed according to the departmental dose-optimization program which includes automated exposure control, adjustment of the mA and/or kV according to patient size and/or use of iterative reconstruction technique. COMPARISON:  CT head and CTA head and neck 02/10/2022 FINDINGS: Brain: The right MCA infarct and encephalomalacia is again noted. Focal hyperdensity along the superomedial aspect of the encephalomalacia is stable. The area is more sharply defined on this study. An additional linear hyperdensity is present along the posterior superior aspect of the encephalomalacia, also sharply defined. No new hyperdensities are present. No acute infarct is present. Basal ganglia are unchanged. Ex vacuo dilation of the right lateral ventricle is stable. A remote infarct in the posterior left operculum is stable. Left hemisphere is otherwise unremarkable. The brainstem and cerebellum are within normal limits. Vascular: No hyperdense vessel or unexpected calcification. Skull: Calvarium is intact. No focal lytic or blastic lesions are present. No significant extracranial soft tissue lesion is present. Sinuses/Orbits: The paranasal sinuses and mastoid air cells are clear. The globes and orbits are within normal limits. IMPRESSION: 1. 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 2. Focal hyperdensity along the superomedial aspect of the encephalomalacia is more sharply defined on this study. This likely represents  calcification related to the prior infarcts. No definite blood products are seen. 3. Stable remote infarct of the posterior left operculum. Of note a similar linear calcification is present. 4. Follow-up head CT in 24-48 hours could be used to confirm stability of these hyperdensities. Electronically Signed   By: San Morelle M.D.   On: 02/10/2022 08:30   MR BRAIN WO CONTRAST  Result Date: 02/10/2022 CLINICAL DATA:  Possible subarachnoid hemorrhage.  Stroke suspected. EXAM: MRI HEAD WITHOUT CONTRAST TECHNIQUE: Multiplanar, multiecho pulse sequences of the brain and surrounding structures were obtained without intravenous contrast. COMPARISON:  Head CT 02/10/2022 FINDINGS: Brain: There is a small left frontal cortical/subcortical acute infarct and bilateral punctate cerebellar acute infarcts. Old right frontal infarct is unchanged. No acute or chronic hemorrhage. There is multifocal hyperintense T2-weighted signal within the white matter. Generalized volume loss without a clear lobar predilection. The midline structures are normal. No focal abnormality to correlate with a small hyperdensity seen on the earlier CT. Vascular: Major flow voids are preserved. Skull and upper cervical spine: Normal calvarium and skull base. Visualized upper cervical spine and soft tissues are normal. Sinuses/Orbits:No paranasal sinus fluid levels or advanced mucosal thickening. No mastoid  or middle ear effusion. Normal orbits. IMPRESSION: 1. Small acute left frontal cortical/subcortical infarct and bilateral punctate cerebellar acute infarcts. No hemorrhage or mass effect. 2. Old right frontal infarct and findings of chronic small vessel disease. Electronically Signed   By: Ulyses Jarred M.D.   On: 02/10/2022 19:35   EEG adult  Result Date: 02/10/2022 Lora Havens, MD     02/10/2022  9:57 AM Patient Name: Clennon Nasca MRN: [id number] Epilepsy Attending: Lora Havens Referring Physician/Provider: Gwinda Maine, MD Date: 02/10/2022 Duration: 25.24 mins Patient history:  66 y.o. male PMHx DVT and PE on apixaban woke with seizure found to have [place]. EEG to evaluate for seizure Level of alertness: Awake AEDs during EEG study: LEV Technical aspects: This EEG study was done with scalp electrodes positioned according to the 10-20 International system of electrode placement. Electrical activity was acquired at a sampling rate of '500Hz'$  and reviewed with a high frequency filter of '70Hz'$  and a low frequency filter of '1Hz'$ . EEG data were recorded continuously and digitally stored. Description: The posterior dominant rhythm consists of 9-10 Hz activity of moderate voltage (25-35 uV) seen predominantly in posterior head regions, symmetric and reactive to eye opening and eye closing. Physiologic photic driving was seen during photic stimulation.  Hyperventilation was not performed.   IMPRESSION: This study is within normal limits. No seizures or epileptiform discharges were seen throughout the recording. Lora Havens   ECHOCARDIOGRAM COMPLETE BUBBLE STUDY  Result Date: 02/10/2022    ECHOCARDIOGRAM REPORT   Patient Name:   BERLIN MOKRY Date of Exam: 02/10/2022 Medical Rec #:  [id number]            Height:       66.0 in Accession #:    [id number]           Weight:       168.9 lb Date of Birth:  June 08, 1956            BSA:          1.861 m Patient Age:    87 years             BP:           130/85 mmHg Patient Gender: M                    HR:           61 bpm. Exam Location:  Inpatient Procedure: 2D Echo, Cardiac Doppler, Color Doppler and Saline Contrast Bubble            Study Indications:    CVA  History:        Patient has prior history of Echocardiogram examinations, most                 recent 05/31/2020. CHF; Risk Factors:Hypertension and Diabetes.  Sonographer:    Luisa Hart RDCS Referring Phys: [id number] JAN A MANSY  Sonographer Comments: Suboptimal apical window and suboptimal subcostal window. Image acquisition challenging  due to patient body habitus. IMPRESSIONS  1. Left ventricular ejection fraction, by estimation, is 60 to 65%. The left ventricle has normal function. The left ventricle has no regional wall motion abnormalities. There is moderate left ventricular hypertrophy. Left ventricular diastolic parameters were normal.  2. Right ventricular systolic function is normal. The right ventricular size is normal.  3. Bubble study attempted but not diagnostic due to poor echo window.  4. The pericardial effusion is posterior to  the left ventricle.  5. The mitral valve is abnormal. Mild mitral valve regurgitation. No evidence of mitral stenosis.  6. The aortic valve is tricuspid. There is mild calcification of the aortic valve. Aortic valve regurgitation is not visualized. Aortic valve sclerosis is present, with no evidence of aortic valve stenosis.  7. The inferior vena cava is normal in size with greater than 50% respiratory variability, suggesting right atrial pressure of 3 mmHg. FINDINGS  Left Ventricle: Left ventricular ejection fraction, by estimation, is 60 to 65%. The left ventricle has normal function. The left ventricle has no regional wall motion abnormalities. The left ventricular internal cavity size was normal in size. There is  moderate left ventricular hypertrophy. Left ventricular diastolic parameters were normal. Right Ventricle: The right ventricular size is normal. No increase in right ventricular wall thickness. Right ventricular systolic function is normal. Left Atrium: Left atrial size was normal in size. Right Atrium: Right atrial size was normal in size. Pericardium: Trivial pericardial effusion is present. The pericardial effusion is posterior to the left ventricle. Mitral Valve: The mitral valve is abnormal. There is moderate thickening of the mitral valve leaflet(s). There is mild calcification of the mitral valve leaflet(s). Mild mitral valve regurgitation. No evidence of mitral valve stenosis. Tricuspid  Valve: The tricuspid valve is normal in structure. Tricuspid valve regurgitation is trivial. No evidence of tricuspid stenosis. Aortic Valve: The aortic valve is tricuspid. There is mild calcification of the aortic valve. Aortic valve regurgitation is not visualized. Aortic valve sclerosis is present, with no evidence of aortic valve stenosis. Aortic valve mean gradient measures 2.0 mmHg. Aortic valve peak gradient measures 3.0 mmHg. Aortic valve area, by VTI measures 3.50 cm. Pulmonic Valve: The pulmonic valve was normal in structure. Pulmonic valve regurgitation is not visualized. No evidence of pulmonic stenosis. Aorta: The aortic root is normal in size and structure. Venous: The inferior vena cava is normal in size with greater than 50% respiratory variability, suggesting right atrial pressure of 3 mmHg. IAS/Shunts: The interatrial septum was not well visualized. Agitated saline contrast was given intravenously to evaluate for intracardiac shunting.  LEFT VENTRICLE PLAX 2D LVIDd:         4.10 cm     Diastology LVIDs:         2.40 cm     LV e' medial:    4.80 cm/s LV PW:         1.10 cm     LV E/e' medial:  11.5 LV IVS:        1.50 cm     LV e' lateral:   8.03 cm/s LVOT diam:     2.20 cm     LV E/e' lateral: 6.9 LV SV:         67 LV SV Index:   36 LVOT Area:     3.80 cm  LV Volumes (MOD) LV vol d, MOD A2C: 62.4 ml LV vol d, MOD A4C: 59.0 ml LV vol s, MOD A2C: 27.6 ml LV vol s, MOD A4C: 20.3 ml LV SV MOD A2C:     34.8 ml LV SV MOD A4C:     59.0 ml LV SV MOD BP:      40.7 ml RIGHT VENTRICLE RV Basal diam:  2.90 cm RV Mid diam:    2.10 cm LEFT ATRIUM             Index        RIGHT ATRIUM  Index LA diam:        3.20 cm 1.72 cm/m   RA Area:     16.00 cm LA Vol (A2C):   34.0 ml 18.27 ml/m  RA Volume:   40.90 ml  21.98 ml/m LA Vol (A4C):   46.9 ml 25.20 ml/m LA Biplane Vol: 40.8 ml 21.92 ml/m  AORTIC VALVE                    PULMONIC VALVE AV Area (Vmax):    3.55 cm     PV Vmax:       0.61 m/s AV Area  (Vmean):   3.36 cm     PV Vmean:      46.700 cm/s AV Area (VTI):     3.50 cm     PV VTI:        0.164 m AV Vmax:           86.30 cm/s   PV Peak grad:  1.5 mmHg AV Vmean:          58.600 cm/s  PV Mean grad:  1.0 mmHg AV VTI:            0.191 m AV Peak Grad:      3.0 mmHg AV Mean Grad:      2.0 mmHg LVOT Vmax:         80.60 cm/s LVOT Vmean:        51.800 cm/s LVOT VTI:          0.176 m LVOT/AV VTI ratio: 0.92  AORTA Ao Root diam: 2.90 cm Ao Asc diam:  3.20 cm MITRAL VALVE MV Area (PHT): 3.91 cm    SHUNTS MV Decel Time: 194 msec    Systemic VTI:  0.18 m MV E velocity: 55.20 cm/s  Systemic Diam: 2.20 cm MV A velocity: 70.60 cm/s MV E/A ratio:  0.78 Jenkins Rouge MD Electronically signed by Jenkins Rouge MD Signature Date/Time: 02/10/2022/10:56:35 AM    Final    CT HEAD CODE STROKE WO CONTRAST  Result Date: 02/10/2022 CLINICAL DATA:  Code stroke. EXAM: CT HEAD WITHOUT CONTRAST TECHNIQUE: Contiguous axial images were obtained from the base of the skull through the vertex without intravenous contrast. RADIATION DOSE REDUCTION: This exam was performed according to the departmental dose-optimization program which includes automated exposure control, adjustment of the mA and/or kV according to patient size and/or use of iterative reconstruction technique. COMPARISON:  None. 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 At the superomedial aspect of the area of encephalomalacia, there is a small focus of subarachnoid hyperdensity. No mass effect. Vascular: No abnormal hyperdensity of the major intracranial arteries or dural venous sinuses. No intracranial atherosclerosis. Skull: The visualized skull base, calvarium and extracranial soft tissues are normal. Sinuses/Orbits: No fluid levels or advanced mucosal thickening of the visualized paranasal sinuses. No mastoid or middle ear effusion. The orbits are normal. IMPRESSION: 1. Small focus of subarachnoid hyperdensity at the superomedial aspect of the area  of encephalomalacia, favored to be a small amount of subarachnoid hemorrhage. 2. 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 Critical Value/emergent results were called by telephone at the time of interpretation on 02/10/2022 at 1:54 am to provider Lynnae Sandhoff, who verbally acknowledged these results. Electronically Signed   By: Ulyses Jarred M.D.   On: 02/10/2022 01:59   VAS [place] LOWER EXTREMITY VENOUS (DVT)  Result Date: 02/10/2022  Lower Venous DVT Study Patient Name:  JACK BOLIO  Date of Exam:   02/10/2022  Medical Rec #: [id number]             Accession #:    [id number] Date of Birth: 1956-08-09             Patient Gender: M Patient Age:   28 years Exam Location:  [facility] Procedure:      VAS [place] LOWER EXTREMITY VENOUS (DVT) Referring Phys: Vitaly Wanat [place] --------------------------------------------------------------------------------  Indications: History of left lower extremity DVT,.  Anticoagulation: Previously on Eliquis, now with SAH. Comparison Study: 06-08-2020 Prior bilateral lower extremity venous was positive                   for acute DVT involving the left lower extremity. Performing Technologist: Darlin Coco RDMS, RVT  Examination Guidelines: A complete evaluation includes B-mode imaging, spectral Doppler, color Doppler, and power Doppler as needed of all accessible portions of each vessel. Bilateral testing is considered an integral part of a complete examination. Limited examinations for reoccurring indications may be performed as noted. The reflux portion of the exam is performed with the patient in reverse Trendelenburg.  +---------+---------------+---------+-----------+----------+--------------+  RIGHT     Compressibility Phasicity Spontaneity Properties Thrombus Aging  +---------+---------------+---------+-----------+----------+--------------+  CFV       Full            Yes       Yes                                     +---------+---------------+---------+-----------+----------+--------------+  SFJ       Full                                                             +---------+---------------+---------+-----------+----------+--------------+  FV Prox   Full                                                             +---------+---------------+---------+-----------+----------+--------------+  FV Mid    Full                                                             +---------+---------------+---------+-----------+----------+--------------+  FV Distal Full                                                             +---------+---------------+---------+-----------+----------+--------------+  PFV       Full                                                             +---------+---------------+---------+-----------+----------+--------------+  POP       Full                                                             +---------+---------------+---------+-----------+----------+--------------+  PTV       Full                                                             +---------+---------------+---------+-----------+----------+--------------+  PERO      Full                                                             +---------+---------------+---------+-----------+----------+--------------+  Gastroc   Full                                                             +---------+---------------+---------+-----------+----------+--------------+   +---------+---------------+---------+-----------+----------+--------------+  LEFT      Compressibility Phasicity Spontaneity Properties Thrombus Aging  +---------+---------------+---------+-----------+----------+--------------+  CFV       Full            Yes       Yes                                    +---------+---------------+---------+-----------+----------+--------------+  SFJ       Full                                                              +---------+---------------+---------+-----------+----------+--------------+  FV Prox   None            No        No                     Chronic         +---------+---------------+---------+-----------+----------+--------------+  FV Mid    None            No        No                     Chronic         +---------+---------------+---------+-----------+----------+--------------+  FV Distal None            No        No                     Chronic         +---------+---------------+---------+-----------+----------+--------------+  PFV  Full            Yes       Yes                                    +---------+---------------+---------+-----------+----------+--------------+  POP       Partial         Yes       Yes                    Chronic         +---------+---------------+---------+-----------+----------+--------------+  PTV       Full                                                             +---------+---------------+---------+-----------+----------+--------------+  PERO      Full                                                             +---------+---------------+---------+-----------+----------+--------------+  Gastroc   Full                                                             +---------+---------------+---------+-----------+----------+--------------+     Summary: RIGHT: - There is no evidence of deep vein thrombosis in the lower extremity.  - No cystic structure found in the popliteal fossa.  LEFT: - Findings consistent with chronic deep vein thrombosis involving the left femoral vein, and left popliteal vein. - No cystic structure found in the popliteal fossa.  *See table(s) above for measurements and observations. Electronically signed by Orlie Pollen on 02/10/2022 at 7:28:18 PM.    Final    CT ANGIO HEAD NECK W WO CM (CODE STROKE)  Result Date: 02/10/2022 CLINICAL DATA:  Stroke follow-up EXAM: CT ANGIOGRAPHY HEAD AND NECK TECHNIQUE: Multidetector CT imaging of the head and neck was  performed using the standard protocol during bolus administration of intravenous contrast. Multiplanar CT image reconstructions and MIPs were obtained to evaluate the vascular anatomy. Carotid stenosis measurements (when applicable) are obtained utilizing NASCET criteria, using the distal internal carotid diameter as the denominator. RADIATION DOSE REDUCTION: This exam was performed according to the departmental dose-optimization program which includes automated exposure control, adjustment of the mA and/or kV according to patient size and/or use of iterative reconstruction technique. CONTRAST:  82m OMNIPAQUE IOHEXOL 350 MG/ML SOLN COMPARISON:  None. FINDINGS: CTA NECK FINDINGS SKELETON: There is no bony spinal canal stenosis. No lytic or blastic lesion. OTHER NECK: Normal pharynx, larynx and major salivary glands. No cervical lymphadenopathy. Unremarkable thyroid gland. UPPER CHEST: No pneumothorax or pleural effusion. No nodules or masses. AORTIC ARCH: Below the field of view. The visualized proximal subclavian arteries are widely patent. RIGHT CAROTID SYSTEM: Normal without aneurysm, dissection or stenosis. LEFT CAROTID SYSTEM: Normal without aneurysm, dissection or stenosis. VERTEBRAL ARTERIES: Left dominant  configuration. Both origins are clearly patent. There is no dissection, occlusion or flow-limiting stenosis to the skull base (V1-V3 segments). CTA HEAD FINDINGS POSTERIOR CIRCULATION: --Vertebral arteries: Normal V4 segments. --Inferior cerebellar arteries: Normal. --Basilar artery: Normal. --Superior cerebellar arteries: Normal. --Posterior cerebral arteries (PCA): Normal. ANTERIOR CIRCULATION: --Intracranial internal carotid arteries: Normal. --Anterior cerebral arteries (ACA): Normal. Both A1 segments are present. Patent anterior communicating artery (a-comm). --Middle cerebral arteries (MCA): Normal. VENOUS SINUSES: As permitted by contrast timing, patent. ANATOMIC VARIANTS: None Review of the MIP  images confirms the above findings. IMPRESSION: 1. No emergent large vessel occlusion or high-grade stenosis of the intracranial arteries. 2. No dissection, aneurysm or hemodynamically significant stenosis of the carotid or vertebral arteries. Electronically Signed   By: Ulyses Jarred M.D.   On: 02/10/2022 03:10        Scheduled Meds:   stroke: mapping our early stages of recovery book   Does not apply Once   ferrous sulfate  220 mg Oral Daily   insulin aspart  0-9 Units Subcutaneous TID AC & HS   labetalol  5 mg Intravenous Once   multivitamin with minerals  1 tablet Oral Daily   simvastatin  20 mg Oral QHS   Continuous Infusions:  [START ON 02/12/2022] sodium chloride     [START ON 02/13/2022]  ceFAZolin (ANCEF) IV     levETIRAcetam 500 mg (02/11/22 0221)     LOS: 1 day    Time spent: 52 minutes spent on chart review, discussion with nursing staff, consultants, updating family and interview/physical exam; more than 50% of that time was spent in counseling and/or coordination of care.    Rilan Eiland J [place] DO [facility] Available via Epic secure chat 7am-7pm After these hours, please refer to coverage provider listed on [email or web address] 02/11/2022, 1:48 PM

## 2022-02-11 NOTE — Progress Notes (Signed)
ANTICOAGULATION CONSULT NOTE - Initial Consult ? ?Pharmacy Consult for Pradaxa ?Indication: atrial fibrillation and stroke ? ?No Known Allergies ? ?Patient Measurements: ?Height: '5\' 9"'$  (175.3 cm) ?Weight: 72.5 kg (159 lb 14.4 oz) ?IBW/kg (Calculated) : 70.7 ?Heparin Dosing Weight:  ? ?Vital Signs: ?Temp: 98.3 ?F (36.8 ?C) (03/04 1615) ?Temp Source: Oral (03/04 1615) ?BP: 145/82 (03/04 1615) ?Pulse Rate: 84 (03/04 1615) ? ?Labs: ?Recent Labs  ?  02/10/22 ?0139 02/10/22 ?0148 02/11/22 ?0430  ?HGB 14.7 13.9  --   ?HCT 42.8 41.0  --   ?PLT 200  --   --   ?APTT 28  --   --   ?LABPROT 14.4  --   --   ?INR 1.1  --   --   ?CREATININE 1.36* 1.40* 1.03  ? ? ?Estimated Creatinine Clearance: 73.4 mL/min (by C-G formula based on SCr of 1.03 mg/dL). ? ? ?Medical History: ?Past Medical History:  ?Diagnosis Date  ? Diabetes mellitus without complication ([place])   ? Hypertension   ? Renal insufficiency 06/01/2020  ? ? ?Medications:  ?Medications Prior to Admission  ?Medication Sig Dispense Refill Last Dose  ? acetaminophen (TYLENOL) 500 MG tablet Take 1,000 mg by mouth every 6 (six) hours as needed for headache (pain).   02/09/2022  ? ELIQUIS 5 MG TABS tablet Take 5 mg by mouth 2 (two) times daily.   02/09/2022 at 1800  ? FERROUS SULFATE PO Take 1 tablet by mouth daily. Unsure of strength   02/09/2022  ? metFORMIN (GLUCOPHAGE) 500 MG tablet Take 1 tablet (500 mg total) by mouth 2 (two) times daily with a meal. 180 tablet 1 02/09/2022  ? Multiple Vitamins-Minerals (MULTIVITAMIN GUMMIES ADULT PO) Take 2 tablets by mouth daily. Take 2 gummies daily   02/09/2022  ? simvastatin (ZOCOR) 20 MG tablet Take 20 mg by mouth at bedtime.    02/09/2022  ? allopurinol (ZYLOPRIM) 100 MG tablet Take 1 tablet (100 mg total) by mouth daily. (Patient not taking: Reported on 07/27/2020) 30 tablet 3 Not Taking  ? APIXABAN (ELIQUIS) VTE STARTER PACK ('10MG'$  AND '5MG'$ ) Take as directed on package: start with two-'5mg'$  tablets twice daily for 7 days. On day 8, switch to one-'5mg'$   tablet twice daily. (Patient not taking: Reported on 02/10/2022) 1 each 0 Completed Course  ? lisinopril-hydrochlorothiazide (PRINZIDE,ZESTORETIC) 20-12.5 MG per tablet TAKE ONE TABLET BY MOUTH EVERY DAY (Patient not taking: No sig reported) 90 tablet 0 Not Taking  ? ?Scheduled:  ?  stroke: mapping our early stages of recovery book   Does not apply Once  ? dabigatran  150 mg Oral BID  ? ferrous sulfate  220 mg Oral Daily  ? insulin aspart  0-9 Units Subcutaneous TID AC & HS  ? labetalol  5 mg Intravenous Once  ? multivitamin with minerals  1 tablet Oral Daily  ? simvastatin  20 mg Oral QHS  ? ? ?Assessment: ?Pt presented with CVA. He was on apixaban prior to admission. Neuro is changing apixaban to pradaxa with the suspicion for apixaban failure.  ? ?CrCl>38m/min ?CBC wnl ? ?Goal of Therapy:  ?Monitor platelets by anticoagulation protocol: Yes ?  ?Plan:  ?Pradaxa '150mg'$  PO BID ?Rx will monitor peripherally ? ?MOnnie Boer PharmD, BCIDP, AAHIVP, CPP ?Infectious Disease Pharmacist ?02/11/2022 4:19 PM ? ? ? ? ?

## 2022-02-11 NOTE — Progress Notes (Signed)
Occupational Therapy Evaluation ?Patient Details ?Name: Fernando Rhodes ?MRN: [id number] ?DOB: 05-Sep-1956 ?Today's Date: 02/11/2022 ? ? ?History of Present Illness Pt is a 66 y.o. who presented to [facility] on 3/3 with acute onset of suspected seizure.  Pt's wife found him rigid and stiff with left facial droop and shaking all over with froth coming out of his mouth and his eyes rolled back. He was unresponsive and confused with suspected postictal phase his seizures were aborted. 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 MRI pending. 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 ?Clinical Impression ?  ?Pt admitted for concerns listed above. PTA pt reported that he was independent with all ADL's and IADL's, including working at a car wash. At this time, pt presents with decreased safety awareness and balance deficits, requiring min guard for safety with functional mobility and ADL's. OT will continue to follow acutely to ensure pt safety with mobility and assist return to independence.  ?   ? ?Recommendations for follow up therapy are one component of a multi-disciplinary discharge planning process, led by the attending physician.  Recommendations may be updated based on patient status, additional functional criteria and insurance authorization.  ? ?Follow Up Recommendations ? No OT follow up  ?  ?Assistance Recommended at Discharge Set up Supervision/Assistance  ?Patient can return home with the following A little help with walking and/or transfers;A little help with bathing/dressing/bathroom;Assistance with cooking/housework;Direct supervision/assist for medications management ? ?  ?Functional Status Assessment ? Patient has had a recent decline in their functional status and demonstrates  the ability to make significant improvements in function in a reasonable and predictable amount of time.  ?Equipment Recommendations ? None recommended by OT  ?  ?Recommendations for Other Services   ? ? ?  ?Precautions / Restrictions Precautions ?Precautions: Fall ?Precaution Comments: Seizure ?Restrictions ?Weight Bearing Restrictions: No  ? ?  ? ?Mobility Bed Mobility ?Overal bed mobility: Modified Independent ?  ?  ?  ?  ?  ?  ?  ?  ? ?Transfers ?Overall transfer level: Needs assistance ?Equipment used: None ?Transfers: Sit to/from Stand ?Sit to Stand: Min guard ?  ?  ?  ?  ?  ?General transfer comment: mild balance deficit concerns, decrease safety awareness ?  ? ?  ?Balance Overall balance assessment: Mild deficits observed, not formally tested ?  ?Sitting balance-Leahy Scale: Good ?  ?  ?  ?Standing balance-Leahy Scale: Fair ?  ?  ?  ?  ?  ?  ?  ?  ?  ?  ?  ?  ?   ? ?ADL either performed or assessed with clinical judgement  ? ?ADL Overall ADL's : Needs assistance/impaired ?  ?  ?  ?  ?  ?  ?  ?  ?  ?  ?  ?  ?  ?  ?  ?  ?  ?  ?  ?General ADL Comments: Min guard due to decrease in safety awareness  ? ? ? ?Vision Baseline Vision/History: 0 No visual deficits ?Ability to See in Adequate Light: 0 Adequate ?Patient Visual Report: No change from baseline ?Vision Assessment?: No apparent visual deficits  ?   ?Perception   ?  ?Praxis   ?  ? ?Pertinent Vitals/Pain Pain Assessment ?Pain Assessment: No/denies pain  ? ? ? ?  Hand Dominance Right ?  ?Extremity/Trunk Assessment Upper Extremity Assessment ?Upper Extremity Assessment: Overall WFL for tasks assessed;LUE deficits/detail ?LUE Deficits / Details: difficulty flexing fingers, incoordination, motor planning deficit ?LUE Sensation: decreased light touch ?LUE Coordination: decreased fine motor;decreased gross motor ?  ?Lower Extremity Assessment ?Lower Extremity Assessment: Defer to PT evaluation ?  ?Cervical / Trunk Assessment ?Cervical / Trunk Assessment: Normal ?   ?Communication Communication ?Communication: Expressive difficulties ?  ?Cognition Arousal/Alertness: Awake/alert ?Behavior During Therapy: [facility] for tasks assessed/performed ?Overall Cognitive Status: History of cognitive impairments - at baseline ?  ?  ?  ?  ?  ?  ?  ?  ?  ?  ?  ?  ?  ?  ?  ?  ?General Comments: Expressive difficulties at baseline ?  ?  ?General Comments  VSS on rA ? ?  ?Exercises   ?  ?Shoulder Instructions    ? ? ?Home Living Family/patient expects to be discharged to:: Private residence ?Living Arrangements: Spouse/significant other ?Available Help at Discharge: Family;Available PRN/intermittently ?Type of Home: House ?Home Access: Level entry ?  ?  ?Home Layout: One level ?  ?  ?Bathroom Shower/Tub: Tub/shower unit ?  ?Bathroom Toilet: Standard ?  ?  ?Home Equipment: None ?  ?  ?  ? ?  ?Prior Functioning/Environment Prior Level of Function : Independent/Modified Independent ?  ?  ?  ?  ?  ?  ?Mobility Comments: Pt. able to walk independently ?ADLs Comments: all independent, worked at a car wash, drives ?  ? ?  ?  ?OT Problem List: Decreased strength;Decreased range of motion;Decreased activity tolerance;Impaired balance (sitting and/or standing);Decreased coordination;Decreased safety awareness;Decreased knowledge of use of DME or AE ?  ?   ?OT Treatment/Interventions: Self-care/ADL training;Therapeutic exercise;Energy conservation;DME and/or AE instruction;Cognitive remediation/compensation;Patient/family education;Balance training  ?  ?OT Goals(Current goals can be found in the care plan section) Acute Rehab OT Goals ?Patient Stated Goal: To go home ?OT Goal Formulation: With patient ?Time For Goal Achievement: 02/25/22 ?Potential to Achieve Goals: Good ?ADL Goals ?Additional ADL Goal #1: Pt will follow multistep commands independently for 100% of the session. ?Additional ADL Goal #2: Pt will complete a multistep pathfinding task independently. ?Additional ADL Goal #3: Pt will complete a  medication management task with 1 mistake or less.  ?OT Frequency: Min 2X/week ?  ? ?Co-evaluation   ?  ?  ?  ?  ? ?  ?AM-PAC OT "6 Clicks" Daily Activity     ?Outcome Measure Help from another person eating meals?: A Little ?Help from another person taking care of personal grooming?: A Little ?Help from another person toileting, which includes using toliet, bedpan, or urinal?: A Little ?Help from another person bathing (including washing, rinsing, drying)?: A Little ?Help from another person to put on and taking off regular upper body clothing?: A Little ?Help from another person to put on and taking off regular lower body clothing?: A Little ?6 Click Score: 18 ?  ?End of Session Equipment Utilized During Treatment: Gait belt ?Nurse Communication: Mobility status ? ?Activity Tolerance: Patient tolerated treatment well ?Patient left: in chair;with call bell/phone within reach;with chair alarm set ? ?OT Visit Diagnosis: Unsteadiness on feet (R26.81);Other abnormalities of gait and mobility (R26.89);Muscle weakness (generalized) (M62.81)  ?              ?Time: 4235-3614 ?OT Time Calculation (min): 12 min ?Charges:  OT General Charges ?$OT Visit: 1 Visit ?OT Evaluation ?$OT Eval Moderate Complexity: 1 Mod ? ?Espiridion Supinski  H., OTR/L ?Acute Rehabilitation ? ?[place] Elane Yolanda Bonine ?02/11/2022, 2:36 PM ?

## 2022-02-11 NOTE — Progress Notes (Signed)
STROKE TEAM PROGRESS NOTE  ? ?INTERVAL HISTORY ?His wife is at the bedside.  Patient brother Fernando Rhodes is on the phone.  No acute event overnight.  MRI and repeat CT showed no SAH.  An MRI did show small multifocal infarcts.  Will need to resume anticoagulation.  Per wife and brother, patient take Eliquis in compliance, will need to switch to Pradaxa given Eliquis failure. ? ?Vitals:  ? 02/11/22 0422 02/11/22 0731 02/11/22 1134 02/11/22 1615  ?BP: 128/73 (!) 151/89 125/76 (!) 145/82  ?Pulse: 67 78 72 84  ?Resp: '18 14 18 16  '$ ?Temp: 98.4 ?F (36.9 ?C) 98 ?F (36.7 ?C) 98.7 ?F (37.1 ?C) 98.3 ?F (36.8 ?C)  ?TempSrc: Oral Oral Oral Oral  ?SpO2: 96% 95% 97% 96%  ?Weight:      ?Height:      ? ?CBC:  ?Recent Labs  ?Lab 02/10/22 ?0139 02/10/22 ?0148  ?WBC 7.9  --   ?NEUTROABS 3.7  --   ?HGB 14.7 13.9  ?HCT 42.8 41.0  ?MCV 95.7  --   ?PLT 200  --   ? ?Basic Metabolic Panel:  ?Recent Labs  ?Lab 02/10/22 ?0139 02/10/22 ?0148 02/11/22 ?0430  ?NA 135 139 139  ?K 3.4* 3.5 3.3*  ?CL 103 105 105  ?CO2 16*  --  24  ?GLUCOSE 200* 188* 109*  ?BUN '15 18 11  '$ ?CREATININE 1.36* 1.40* 1.03  ?CALCIUM 8.6*  --  8.3*  ?MG  --   --  1.9  ? ?Lipid Panel:  ?Recent Labs  ?Lab 02/11/22 ?0430  ?CHOL 141  ?TRIG 97  ?HDL 51  ?CHOLHDL 2.8  ?VLDL 19  ?[place] 71  ?  ?HgbA1c:  ?Recent Labs  ?Lab 02/10/22 ?0139  ?HGBA1C 5.7*  ?  ?Urine Drug Screen: No results for input(s): LABOPIA, COCAINSCRNUR, LABBENZ, AMPHETMU, THCU, LABBARB in the last 168 hours.  ?Alcohol Level No results for input(s): ETH in the last 168 hours. ? ?IMAGING past 24 hours ?MR BRAIN WO CONTRAST ? ?Result Date: 02/10/2022 ?CLINICAL DATA:  Possible subarachnoid hemorrhage.  Stroke suspected. EXAM: MRI HEAD WITHOUT CONTRAST TECHNIQUE: Multiplanar, multiecho pulse sequences of the brain and surrounding structures were obtained without intravenous contrast. COMPARISON:  Head CT 02/10/2022 FINDINGS: Brain: There is a small left frontal cortical/subcortical acute infarct and bilateral punctate  cerebellar acute infarcts. Old right frontal infarct is unchanged. No acute or chronic hemorrhage. There is multifocal hyperintense T2-weighted signal within the white matter. Generalized volume loss without a clear lobar predilection. The midline structures are normal. No focal abnormality to correlate with a small hyperdensity seen on the earlier CT. Vascular: Major flow voids are preserved. Skull and upper cervical spine: Normal calvarium and skull base. Visualized upper cervical spine and soft tissues are normal. Sinuses/Orbits:No paranasal sinus fluid levels or advanced mucosal thickening. No mastoid or middle ear effusion. Normal orbits. IMPRESSION: 1. Small acute left frontal cortical/subcortical infarct and bilateral punctate cerebellar acute infarcts. No hemorrhage or mass effect. 2. Old right frontal infarct and findings of chronic small vessel disease. Electronically Signed   By: Ulyses Jarred M.D.   On: 02/10/2022 19:35   ? ?PHYSICAL EXAM ?Mental Status: ?Patient is awake, alert, oriented to person, place only. Stated Jan 2003 ?Patient is not able to give a clear and coherent history. ?Speech fluent, intact comprehension and repetition. ?No signs of aphasia or neglect. ?Visual Fields are full. Pupils are equal, round, and reactive to light. ?EOMI without ptosis or diplopia.  ?Facial sensation is symmetric to temperature ?Left  facial droop.  ?Hearing is intact to voice. ?Uvula midline and palate elevates symmetrically. ?Shoulder shrug is symmetric. ?Tongue is midline without atrophy or fasciculations.  ?Mild LLE muscular atrophy with 4/5 LLE strength (chronic since childhood). Other 3 extremities 5/5 strength.  ?Sensation is symmetric to light touch and temperature in the arms and legs. ?Toes are mute. ?FNF and HKS are intact bilaterally. ?Gait - Deferred ? ?ASSESSMENT/PLAN ?Fernando Rhodes is a 66 y.o. 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 He presented with left facial droop and left sided weakness especially in left hand. Wife noted generalized shaking and snoring like breathing, likely a seizure. He takes Eliquis for history of blood clot and wife says they make sure he is compliant. Patient was found to have [place] and likely first time seizure, loaded with levetiracetam 1,'000mg'$  then continued on maintenance 500 mg BID. Routine EEG wnl. Left sided weakness is chronic since childhood, confirmed by sister, but L facial droop is new.  ? 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 ?CTA head & neck: Unremarkable ?Repeat CT head: Stable appearance of right MCA infarct and encephalomalacia. Focal hyperdensity along the superomedial aspect of the encephalomalacia likely represents calcification related to the prior infarcts. No definite blood products are seen. Stable remote infarct of the posterior left operculum.  ?Routine EEG: Within normal limits. ?MRI Small acute left frontal cortical/subcortical infarct and bilateral punctate cerebellar acute infarcts.  Bilateral MCA old infarcts. ?2D Echo EF 60 to 65% ?LDL 71 ?HgbA1c 5.7 (02/10/2022) ?UDS pending ?VTE prophylaxis - Scd's  ?Eliquis (apixaban) daily prior to admission, now switch to pradaxa given Eliquis failure ?Therapy recommendations: none ?Disposition:  Pending ? ?Seizure  ?Witnessed seizure at home ?On Keppra status post Keppra load ?Continue Keppra 500 twice daily on discharge ?No driving unless 6 months seizure-free. ?Follow-up with neurology as outpatient ? ?Hypertension ?Home meds: N/A ?Stable ?Long-term BP goal normotensive ? ?Hyperlipidemia ?Home meds:  Simvastatin '20mg'$  qHS, resumed in  hospital ?LDL 71, goal < 70 ?Continue statin at discharge ? ?Diabetes type II Controlled ?Home meds:  Metformin '500mg'$  BID ?HgbA1c 5.7, goal < 7.0 ?CBGs ?SSI ? ?History of DVT and PE ?05/2020 episode of syncope, found to have left LE DVT and PE, septic shock with liver abscess status post biopsy, put on antibiotic and Eliquis. ?Now on Pradaxa ? 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 ?Other Active Problems ?Gout: continue allopurinol ?CKD 3A: per primary ? ?Hospital day # 1 ? ?Neurology will sign off. Please call with questions. Pt will follow up with stroke clinic NP at [facility] in about 4 weeks. Thanks for the consult. ? ?Rosalin Hawking, MD PhD ?Stroke Neurology ?02/11/2022 ?7:47 PM ? ? ?To contact Stroke Continuity provider, please refer to [email or web address] ?After hours, contact General Neurology  ?

## 2022-02-12 LAB — GLUCOSE, CAPILLARY
Glucose-Capillary: 106 mg/dL — ABNORMAL HIGH (ref 70–99)
Glucose-Capillary: 110 mg/dL — ABNORMAL HIGH (ref 70–99)
Glucose-Capillary: 114 mg/dL — ABNORMAL HIGH (ref 70–99)
Glucose-Capillary: 131 mg/dL — ABNORMAL HIGH (ref 70–99)
Glucose-Capillary: 139 mg/dL — ABNORMAL HIGH (ref 70–99)
Glucose-Capillary: 259 mg/dL — ABNORMAL HIGH (ref 70–99)

## 2022-02-12 MED ORDER — SODIUM CHLORIDE 0.9 % IV SOLN
INTRAVENOUS | Status: DC | PRN
Start: 2022-02-12 — End: 2022-02-13

## 2022-02-12 NOTE — Progress Notes (Signed)
PROGRESS NOTE    Fernando Rhodes  ZOX:[id number] DOB: 11/22/1956 DOA: 02/10/2022 PCP: London Pepper, MD    Brief Narrative:  Fernando Rhodes is a 66 year old male with past medical history significant for type 2 diabetes mellitus, HTN, DVT/PE June 2021 on Eliquis, CKD stage IIIa, CVA who presented to [facility] ED on 3/3 via EMS after spouse noted seizure-like activity, left-sided facial droop and left-sided weakness.  Last known normal 2130 on 02/09/2022.  No reported tongue biting or bladder/bowel incontinence.  Patient was confused on arrival, likely due to postictal state.  He was noted to have persistent left facial droop.  He denied fever/chills, no nausea/vomiting/diarrhea, no abdominal pain, no chest pain, no shortness of breath, no palpitations, no urinary complaints.  In the ED, temperature 97.3 F, HR 82, RR 18, BP 143/92, SPO2 89% on room air.  Sodium 135, potassium 3.4, chloride 103, CO2 16, glucose 200, BUN 15, creatinine 1.36, AST 24, ALT 15, total bilirubin 0.6.  WBC 7.9, hemoglobin 14.7, platelets 200.  COVID-19 PCR negative.  Influenza A/B PCR negative.  Hemoglobin A1c 5.7.  CT head without contrast with small focus of subarachnoid hyperdensity at the superior medial aspect of encephalomalacia concerning for subarachnoid hemorrhage; old right insular/opercular infarct.  Neurology was consulted.  EDP ordered Eliquis reversal, labetalol 5 mg IV, 1000 mg IV Keppra loading dose.  Hospitalist service consulted for further evaluation and management of acute subarachnoid hemorrhage with seizure.     Assessment & Plan:   Assessment and Plan: * Seizure [facility]) Patient presenting to ED via EMS after being found unresponsive by spouse with seizure-like activity.  No signs of tongue trauma, and no reported bowel/bladder incontinence.  Patient with history of CVA.  No signs of infectious etiology.  Loaded with IV Keppra.  EEG with no seizure or epileptiform discharges noted throughout the  reading. --Neurology following, appreciate assistance --Keppra 500 mg BID --Ativan 2 mg IV PRN for seizure lasting >68mn --Seizure/aspiration precautions   Acute CVA (cerebrovascular accident) ([facility] Patient presenting to ED following seizure activity with left-sided facial droop which is new.  CT head with small focus of subarachnoid hyperdensity superior medial aspect of the area of encephalomalacia favored to be a small amount of subarachnoid hemorrhage, old right insular/opercular infarct.  CTA head/neck with no emergent large vessel occlusion or high-grade stenosis of the intracranial arteries, no dissection/aneurysm or hemodynamically significant stenosis of the carotid/vertebral arteries.  Repeat CT head stable appearance of right MCA infarct and encephalomalacia.  MR brain without contrast 3/3 with small acute left frontal cortical/subcortical infarct and bilateral punctate cerebellar acute infarcts with no hemorrhage or mass effect; old right frontal infarct with findings of chronic small vessel disease.  TTE with LVEF 60 to 65%, no regional wall motion abnormalities, mild MR, no aortic stenosis, IVC normal in size, bubble study attempted but nondiagnostic due to poor echo window.  Hemoglobin A1c 5.7.  LDL 71. --Neurology following, appreciate assistance --PT/OT/SLP with no recommendations --Simvastatin 20 mg p.o. daily --Outpatient follow-up with neurology in 4 weeks    Subarachnoid hemorrhage ruled out CT head on admission with small focus of subarachnoid hyperdensity superior medial aspect of the area of encephalomalacia favored to be small amount of subarachnoid hemorrhage.  Complicated by history of PE/DVT currently on Eliquis.  Patient received Eliquis reversal by EDP.  CTA head/neck with no dissection/aneurysm noted.  Repeat CT head and MRI brain with no findings of hemorrhage; suspect initial CT results artifact.     Dyslipidemia Lipid  panel total cholesterol 141, HDL 51, LDL  71, triglycerides 97. --Continue home simvastatin 20 mg p.o. daily  Type 2 diabetes mellitus with chronic kidney disease, without long-term current use of insulin (HCC) Hemoglobin A1c 5.7, well controlled.  Home regimen includes metformin 500 mg p.o. twice daily. --Hold oral hypoglycemics while inpatient --SSI for coverage --CBGs qAC/HS   Hx pulmonary embolism, LLE DVT Patient with past history of saddle pulmonary embolism and left lower extremity DVT in June 2021.  Has been on Eliquis since.  Eliquis has been reversed by EDP for concerns of subarachnoid hemorrhage as above. 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 --Neurology recommended change from Eliquis to Pradaxa '150mg'$  PO BID for likely treatment failure with Eliquis with continued left lower extremity DVT --Awaiting TOC for co-pay assessment as Pradaxa may not be covered under his insurance  Stage 3a chronic kidney disease (CKD) (HCC) Baseline creatinine 1.2-1.6.  Creatinine on admission 1.36. --Cr 1.36>1.40>1.03 --Avoid nephrotoxins, renal dose all medications --Holding metformin as above --BMP daily    DVT prophylaxis: SCD's Start: 02/10/22 0528 dabigatran (PRADAXA) capsule 150 mg    Code Status: Full Code Family Communication: Updated patient's family present at bedside this morning  Disposition Plan:  Level of care: Progressive Status is: Inpatient Remains inpatient appropriate because: Awaiting TOC co-pay evaluation for Pradaxa to ensure covered under insurance and affordable prior to safe discharge  Consultants:  Neurology Neuro interventional radiology  Procedures:  TTE Vascular duplex ultrasound lower extremities  Antimicrobials:  None   Subjective: Patient seen examined bedside, resting comfortably.  Family present.  No specific complaints this morning.  Seen  by neurology and now signed off.  Awaiting TOC for evaluation of Pradaxa co-pay to ensure covered under his insurance and affordable given his treatment failure with Eliquis. Denies headache, no visual changes, no chest pain, no palpitations, no shortness of breath, no abdominal pain, no fever/chills/night sweats, no nausea/vomiting/diarrhea.  No acute events overnight per nursing staff.  Objective: Vitals:   02/11/22 2115 02/12/22 0025 02/12/22 0415 02/12/22 0712  BP: (!) 144/93 (!) 142/79 (!) 150/90 (!) 149/90  Pulse: 79 70 74 89  Resp: '17 16 16 16  '$ Temp: 98.1 F (36.7 C) 98 F (36.7 C) 97.7 F (36.5 C) 98 F (36.7 C)  TempSrc: Oral Oral Oral Oral  SpO2: 94% 95% 94% 95%  Weight:      Height:        Intake/Output Summary (Last 24 hours) at 02/12/2022 1236 Last data filed at 02/12/2022 0451 Gross per 24 hour  Intake 457.9 ml  Output 500 ml  Net -42.1 ml   Filed Weights   02/10/22 0100 02/10/22 1510  Weight: 76.6 kg 72.5 kg    Examination:  Physical Exam: GEN: NAD, alert and oriented x 3, chronically ill in appearance HEENT: NCAT, PERRL, EOMI, sclera clear, MMM PULM: CTAB w/o wheezes/crackles, normal respiratory effort, on room air CV: RRR w/o M/G/R GI: abd soft, NTND, NABS, no R/G/M MSK: no peripheral edema, lower extremity muscle strength slightly decreased 4/5, otherwise globally intact.   NEURO: Left facial droop noted, otherwise CN II-XII intact, sensation to light touch intact PSYCH: normal mood/affect Integumentary: dry/intact, no rashes or wounds    Data Reviewed: I have personally reviewed following labs and imaging studies  CBC: Recent Labs  Lab 02/10/22 0139 02/10/22 0148  WBC 7.9  --   NEUTROABS  3.7  --   HGB 14.7 13.9  HCT 42.8 41.0  MCV 95.7  --   PLT 200  --    Basic Metabolic Panel: Recent Labs  Lab 02/10/22 0139 02/10/22 0148 02/11/22 0430  NA 135 139 139  K 3.4* 3.5 3.3*  CL 103 105 105  CO2 16*  --  24  GLUCOSE 200* 188* 109*  BUN  '15 18 11  '$ CREATININE 1.36* 1.40* 1.03  CALCIUM 8.6*  --  8.3*  MG  --   --  1.9   GFR: Estimated Creatinine Clearance: 73.4 mL/min (by C-G formula based on SCr of 1.03 mg/dL). Liver Function Tests: Recent Labs  Lab 02/10/22 0139  AST 24  ALT 15  ALKPHOS 78  BILITOT 0.6  PROT 6.4*  ALBUMIN 3.6   No results for input(s): LIPASE, AMYLASE in the last 168 hours. No results for input(s): AMMONIA in the last 168 hours. Coagulation Profile: Recent Labs  Lab 02/10/22 0139  INR 1.1   Cardiac Enzymes: No results for input(s): CKTOTAL, CKMB, CKMBINDEX, TROPONINI in the last 168 hours. BNP (last 3 results) No results for input(s): PROBNP in the last 8760 hours. HbA1C: Recent Labs    02/10/22 0139  HGBA1C 5.7*   CBG: Recent Labs  Lab 02/11/22 2116 02/12/22 0025 02/12/22 0415 02/12/22 0824 02/12/22 1217  GLUCAP 124* 114* 131* 110* 106*   Lipid Profile: Recent Labs    02/11/22 0430  CHOL 141  HDL 51  LDLCALC 71  TRIG 97  CHOLHDL 2.8   Thyroid Function Tests: No results for input(s): TSH, T4TOTAL, FREET4, T3FREE, THYROIDAB in the last 72 hours. Anemia Panel: No results for input(s): VITAMINB12, FOLATE, FERRITIN, TIBC, IRON, RETICCTPCT in the last 72 hours. Sepsis Labs: No results for input(s): PROCALCITON, LATICACIDVEN in the last 168 hours.  Recent Results (from the past 240 hour(s))  Resp Panel by RT-PCR (Flu A&B, Covid) Nasopharyngeal Swab     Status: None   Collection Time: 02/10/22  2:32 AM   Specimen: Nasopharyngeal Swab; Nasopharyngeal(NP) swabs in vial transport medium  Result Value Ref Range Status   SARS Coronavirus 2 by RT PCR NEGATIVE NEGATIVE Final    Comment: (NOTE) SARS-CoV-2 target nucleic acids are NOT DETECTED.  The SARS-CoV-2 RNA is generally detectable in upper respiratory specimens during the acute phase of infection. The lowest concentration of SARS-CoV-2 viral copies this assay can detect is 138 copies/mL. A negative result does not  preclude SARS-Cov-2 infection and should not be used as the sole basis for treatment or other patient management decisions. A negative result may occur with  improper specimen collection/handling, submission of specimen other than nasopharyngeal swab, presence of viral mutation(s) within the areas targeted by this assay, and inadequate number of viral copies(<138 copies/mL). A negative result must be combined with clinical observations, patient history, and epidemiological information. The expected result is Negative.  Fact Sheet for Patients:  EntrepreneurPulse.com.au  Fact Sheet for Healthcare Providers:  IncredibleEmployment.be  This test is no t yet approved or cleared by the [place] FDA and  has been authorized for detection and/or diagnosis of SARS-CoV-2 by FDA under an Emergency Use Authorization (EUA). This EUA will remain  in effect (meaning this test can be used) for the duration of the COVID-19 declaration under Section 564(b)(1) of the Act, 21 U.S.C.section 360bbb-3(b)(1), unless the authorization is terminated  or revoked sooner.       Influenza A by PCR NEGATIVE NEGATIVE Final   Influenza B by PCR  NEGATIVE NEGATIVE Final    Comment: (NOTE) The Xpert Xpress SARS-CoV-2/FLU/RSV plus assay is intended as an aid in the diagnosis of influenza from Nasopharyngeal swab specimens and should not be used as a sole basis for treatment. Nasal washings and aspirates are unacceptable for Xpert Xpress SARS-CoV-2/FLU/RSV testing.  Fact Sheet for Patients: EntrepreneurPulse.com.au  Fact Sheet for Healthcare Providers: IncredibleEmployment.be  This test is not yet approved or cleared by the [place] FDA and has been authorized for detection and/or diagnosis of SARS-CoV-2 by FDA under an Emergency Use Authorization (EUA). This EUA will remain in effect (meaning this test can be used) for the  duration of the COVID-19 declaration under Section 564(b)(1) of the Act, 21 U.S.C. section 360bbb-3(b)(1), unless the authorization is terminated or revoked.  Performed at [facility], [place] [address] [place], [place] [address]          Radiology Studies: MR BRAIN WO CONTRAST  Result Date: 02/10/2022 CLINICAL DATA:  Possible subarachnoid hemorrhage.  Stroke suspected. EXAM: MRI HEAD WITHOUT CONTRAST TECHNIQUE: Multiplanar, multiecho pulse sequences of the brain and surrounding structures were obtained without intravenous contrast. COMPARISON:  Head CT 02/10/2022 FINDINGS: Brain: There is a small left frontal cortical/subcortical acute infarct and bilateral punctate cerebellar acute infarcts. Old right frontal infarct is unchanged. No acute or chronic hemorrhage. There is multifocal hyperintense T2-weighted signal within the white matter. Generalized volume loss without a clear lobar predilection. The midline structures are normal. No focal abnormality to correlate with a small hyperdensity seen on the earlier CT. Vascular: Major flow voids are preserved. Skull and upper cervical spine: Normal calvarium and skull base. Visualized upper cervical spine and soft tissues are normal. Sinuses/Orbits:No paranasal sinus fluid levels or advanced mucosal thickening. No mastoid or middle ear effusion. Normal orbits. IMPRESSION: 1. Small acute left frontal cortical/subcortical infarct and bilateral punctate cerebellar acute infarcts. No hemorrhage or mass effect. 2. Old right frontal infarct and findings of chronic small vessel disease. Electronically Signed   By: Ulyses Jarred M.D.   On: 02/10/2022 19:35        Scheduled Meds:  dabigatran  150 mg Oral BID   ferrous sulfate  220 mg Oral Daily   insulin aspart  0-9 Units Subcutaneous TID AC & HS   labetalol  5 mg Intravenous Once   multivitamin with minerals  1 tablet Oral Daily   simvastatin  20 mg Oral QHS   Continuous Infusions:  sodium  chloride 10 mL/hr at 02/12/22 0451   [START ON 02/13/2022]  ceFAZolin (ANCEF) IV     levETIRAcetam Stopped (02/12/22 0312)     LOS: 2 days    Time spent: 42 minutes spent on chart review, discussion with nursing staff, consultants, updating family and interview/physical exam; more than 50% of that time was spent in counseling and/or coordination of care.    Rashi Granier J [place] DO [facility] Available via Epic secure chat 7am-7pm After these hours, please refer to coverage provider listed on [email or web address] 02/12/2022, 12:36 PM

## 2022-02-13 ENCOUNTER — Other Ambulatory Visit (HOSPITAL_COMMUNITY): Payer: Self-pay

## 2022-02-13 LAB — GLUCOSE, CAPILLARY
Glucose-Capillary: 135 mg/dL — ABNORMAL HIGH (ref 70–99)
Glucose-Capillary: 161 mg/dL — ABNORMAL HIGH (ref 70–99)

## 2022-02-13 MED ORDER — LEVETIRACETAM 500 MG PO TABS
500.0000 mg | ORAL_TABLET | Freq: Two times a day (BID) | ORAL | 2 refills | Status: DC
  Filled 2022-02-13 – 2022-03-06 (×3): qty 60, 30d supply, fill #0

## 2022-02-13 MED ORDER — DABIGATRAN ETEXILATE MESYLATE 150 MG PO CAPS
150.0000 mg | ORAL_CAPSULE | Freq: Two times a day (BID) | ORAL | 2 refills | Status: DC
  Filled 2022-02-13 – 2022-03-06 (×3): qty 60, 30d supply, fill #0

## 2022-02-13 NOTE — Discharge Instructions (Signed)
Per [facility] statutes, patients with seizures are not allowed to drive until they have been seizure-free for six months. Use caution when using heavy equipment or power tools. Avoid working on ladders or at heights. Take showers instead of baths. Ensure the water temperature is not too high on the home water heater. Do not go swimming alone. Do not lock yourself in a room alone (i.e. bathroom). When caring for infants or small children, sit down when holding, feeding, or changing them to minimize risk of injury to the child in the event you have a seizure. Maintain good sleep hygiene. Avoid alcohol.  ? ?If Fernando Rhodes has another seizure, call 911 and bring them back to the ED if: ?      A.  The seizure lasts longer than 5 minutes.      ?      B.  The patient doesn't wake shortly after the seizure or has new problems such as difficulty seeing, speaking or moving following the seizure ?      C.  The patient was injured during the seizure ?      D.  The patient has a temperature over 102 F (39C) ?      E.  The patient vomited during the seizure and now is having trouble breathing ?

## 2022-02-13 NOTE — Progress Notes (Signed)
Discharge teaching complete. Meds, diet, activity, follow up appointments reviewed and all questions answered. Copy of instructions given to patient and meds at bedside by [facility] pharmacy. Brother in Sports coach present during education. Patient discharged home via wheelchair with brother in law.  ?

## 2022-02-13 NOTE — TOC Benefit Eligibility Note (Signed)
Patient Advocate Encounter ? ?Insurance verification completed.   ? ?The patient is currently admitted and upon discharge could be taking Pradaxa '150mg'$  caps. ? ?The current 30 day co-pay is $398 ? ?Pharmacy Patient Advocate Specialist ?[facility] Patient Advocate Team ?Direct Number: [phone]  Fax: [phone]  ?

## 2022-02-13 NOTE — TOC Transition Note (Signed)
Transition of Care (TOC) - CM/SW Discharge Note ? ? ?Patient Details  ?Name: Fernando Rhodes ?MRN: [id number] ?Date of Birth: 1956/12/02 ? ?Transition of Care (TOC) CM/SW Contact:  ?Pollie Friar, RN ?Phone Number: ?02/13/2022, 10:34 AM ? ? ?Clinical Narrative:    ?Patient is discharging home with self care. No needs per PT/OT/ST. Pt has supervision at home.  ?His brother is going to provide needed transport home.  ?CM provided Pradaxa coupon for patient to assist with his co pays until his deductible is met. [facility] pharmacy will deliver the discharge medications to the patients room. ? ? ?Final next level of care: Home/Self Care ?Barriers to Discharge: No Barriers Identified ? ? ?Patient Goals and CMS Choice ?  ?  ?  ? ?Discharge Placement ?  ?           ?  ?  ?  ?  ? ?Discharge Plan and Services ?  ?  ?           ?  ?  ?  ?  ?  ?  ?  ?  ?  ?  ? ?Social Determinants of Health (SDOH) Interventions ?  ? ? ?Readmission Risk Interventions ?No flowsheet data found. ? ? ? ? ?

## 2022-02-13 NOTE — Discharge Summary (Signed)
Physician Discharge Summary  Fernando Rhodes GGE:[id number] DOB: [birth year] DOA: 02/10/2022  PCP: London Pepper, MD  Admit date: 02/10/2022 Discharge date: 02/13/2022  Admitted From: Home Disposition: Home  Recommendations for Outpatient Follow-up:  Follow up with PCP in 1-2 weeks Follow-up with neurology in 4 weeks Started on Keppra 500 mg p.o. twice daily for seizure Eliquis discontinued in favor of Pradaxa 100 mg p.o. 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  Last known normal 2130 on 02/09/2022.  No reported tongue biting or bladder/bowel incontinence.  Patient was confused on arrival, likely due to postictal state.  He was noted to have persistent left facial droop.  He denied fever/chills, no nausea/vomiting/diarrhea, no abdominal pain, no chest pain, no shortness of breath, no palpitations, no urinary complaints.  In the ED, temperature 97.3 F, HR 82, RR 18, BP 143/92, SPO2 89% on room air.  Sodium 135, potassium 3.4, chloride 103, CO2 16, glucose 200, BUN 15, creatinine 1.36, AST 24, ALT 15, total bilirubin 0.6.  WBC 7.9, hemoglobin 14.7, platelets 200.  COVID-19 PCR negative.  Influenza A/B PCR negative.  Hemoglobin A1c 5.7.  CT head without contrast with small focus of subarachnoid hyperdensity at the superior medial aspect of encephalomalacia concerning for subarachnoid hemorrhage; old right insular/opercular infarct.  Neurology was consulted.  EDP ordered Eliquis reversal, labetalol 5 mg IV,  1000 mg IV Keppra loading dose.  Hospitalist service consulted for further evaluation and management of acute subarachnoid hemorrhage with seizure.   Hospital course:  Assessment and Plan: * Seizure [facility]) Patient presenting to ED via EMS after being found unresponsive by spouse with seizure-like activity.  No signs of tongue trauma, and no reported bowel/bladder incontinence.  Patient with history of CVA.  No signs of infectious etiology.  Neurology was consulted and followed during hospital course.  Loaded with IV Keppra.  EEG with no seizure or epileptiform discharges noted throughout the reading.  Continue Keppra 500 mg p.o. twice daily.  Outpatient follow-up with neurology in 4 weeks.    Acute CVA (cerebrovascular accident) [facility]) Patient presenting to ED following seizure activity with left-sided facial droop which is new.  CT head with small focus of subarachnoid hyperdensity superior medial aspect of the area of encephalomalacia favored to be a small amount of subarachnoid hemorrhage, old right insular/opercular infarct.  CTA head/neck with no emergent large vessel occlusion or high-grade stenosis of the intracranial arteries, no dissection/aneurysm or hemodynamically significant stenosis of the carotid/vertebral arteries.  Repeat CT head stable appearance of right MCA infarct and encephalomalacia.  MR brain without contrast 3/3 with small acute left frontal cortical/subcortical infarct and bilateral punctate cerebellar acute infarcts with no hemorrhage or mass effect; old right frontal infarct with findings of chronic small vessel disease.  TTE with LVEF 60 to 65%, no regional wall motion abnormalities, mild MR, no aortic stenosis, IVC normal in size, bubble study attempted but nondiagnostic due to poor echo window.  Hemoglobin A1c 5.7.  LDL 71.  Home Eliquis was discontinued in favor of Pradaxa 150 mg p.o. twice daily as this was  thought to be a treatment failure by neurology.  Continue  simvastatin 20 mg p.o. daily.  Outpatient follow-up with neurology 4 weeks.    Subarachnoid hemorrhage ruled out CT head on admission with small focus of subarachnoid hyperdensity superior medial aspect of the area of encephalomalacia favored to be small amount of subarachnoid hemorrhage.  Complicated by history of PE/DVT currently on Eliquis.  Patient received Eliquis reversal by EDP.  CTA head/neck with no dissection/aneurysm noted.  Repeat CT head and MRI brain with no findings of hemorrhage; suspect initial CT results artifact.     Dyslipidemia Lipid panel total cholesterol 141, HDL 51, LDL 71, triglycerides 97. Continue home simvastatin 20 mg p.o. daily  Type 2 diabetes mellitus with chronic kidney disease, without long-term current use of insulin (HCC) Hemoglobin A1c 5.7, well controlled.  Home regimen includes metformin 500 mg p.o. twice daily.   Hx pulmonary embolism, LLE DVT Patient with past history of saddle pulmonary embolism and left lower extremity DVT in June 2021.  Has been on Eliquis since.  Eliquis has been reversed by EDP for concerns of subarachnoid hemorrhage as above. 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 Neurology recommended change from Eliquis to Pradaxa '150mg'$  PO BID for likely treatment failure with Eliquis with continued left lower extremity DVT.  Stage 3a chronic kidney disease (CKD) (HCC) Baseline creatinine 1.2-1.6.  Creatinine on admission 1.36.  Creatinine improved to 1.03 at time of discharge.       Discharge Diagnoses:  Principal Problem:   Seizure ([place]) Active Problems:   Acute CVA (cerebrovascular accident) ([place])   Subarachnoid hemorrhage ruled out   Dyslipidemia   Type 2 diabetes mellitus with chronic kidney disease, without long-term current use of insulin ([place])   Hx pulmonary embolism, LLE DVT    Stage 3a chronic kidney disease (CKD) [facility])    Discharge Instructions  Discharge Instructions     Ambulatory referral to Neurology   Complete by: As directed    Follow up with stroke clinic NP (Jessica Vanschaick or Cecille Rubin, if both not available, consider Zachery Dauer, or A[place]) at [facility] in about 4 weeks. Thanks.   Call MD for:  difficulty breathing, headache or visual disturbances   Complete by: As directed    Call MD for:  extreme fatigue   Complete by: As directed    Call MD for:  persistant dizziness or light-headedness   Complete by: As directed    Call MD for:  persistant nausea and vomiting   Complete by: As directed    Call MD for:  severe uncontrolled pain   Complete by: As directed    Call MD for:  temperature >100.4   Complete by: As directed    Diet - low sodium heart healthy   Complete by: As directed    Increase activity slowly   Complete by: As directed       Allergies as of 02/13/2022   No Known Allergies      Medication List     STOP taking these medications    allopurinol 100 MG tablet Commonly known as: ZYLOPRIM   Apixaban Starter Pack ('10mg'$  and '5mg'$ ) Commonly known as: ELIQUIS STARTER PACK   Eliquis 5 MG Tabs tablet Generic drug: apixaban   lisinopril-hydrochlorothiazide 20-12.5 MG tablet Commonly known as: ZESTORETIC       TAKE these medications    acetaminophen 500  MG tablet Commonly known as: TYLENOL Take 1,000 mg by mouth every 6 (six) hours as needed for headache (pain).   dabigatran 150 MG Caps capsule Commonly known as: PRADAXA Take 1 capsule (150 mg total) by mouth 2 (two) times daily.   FERROUS SULFATE PO Take 1 tablet by mouth daily. Unsure of strength   levETIRAcetam 500 MG tablet Commonly known as: Keppra Take 1 tablet (500 mg total) by mouth 2 (two) times daily.   metFORMIN 500 MG tablet Commonly known as: Glucophage Take 1 tablet (500 mg total) by mouth 2 (two) times daily with a meal.   MULTIVITAMIN  GUMMIES ADULT PO Take 2 tablets by mouth daily. Take 2 gummies daily   simvastatin 20 MG tablet Commonly known as: ZOCOR Take 20 mg by mouth at bedtime.        Follow-up Information     [facility]. Schedule an appointment as soon as possible for a visit in 1 month(s).   Specialty: Neurology Why: stroke clinic Contact information: [address] [place] [phone]        London Pepper, MD. Schedule an appointment as soon as possible for a visit in 1 week(s).   Specialty: Family Medicine Contact information: [address] [place] [address] [phone]                No Known Allergies  Consultations: Neurology   Procedures/Studies: CT HEAD WO CONTRAST (5MM)  Result Date: 02/10/2022 CLINICAL DATA:  Subarachnoid hemorrhage. EXAM: CT HEAD WITHOUT CONTRAST TECHNIQUE: Contiguous axial images were obtained from the base of the skull through the vertex without intravenous contrast. RADIATION DOSE REDUCTION: This exam was performed according to the departmental dose-optimization program which includes automated exposure control, adjustment of the mA and/or kV according to patient size and/or use of iterative reconstruction technique. COMPARISON:  CT head and CTA head and neck 02/10/2022 FINDINGS: Brain: The right MCA infarct and encephalomalacia is again noted. Focal hyperdensity along the superomedial aspect of the encephalomalacia is stable. The area is more sharply defined on this study. An additional linear hyperdensity is present along the posterior superior aspect of the encephalomalacia, also sharply defined. No new hyperdensities are present. No acute infarct is present. Basal ganglia are unchanged. Ex vacuo dilation of the right lateral ventricle is stable. A remote infarct in the posterior left operculum is stable. Left hemisphere is otherwise unremarkable. The brainstem and cerebellum are  within normal limits. Vascular: No hyperdense vessel or unexpected calcification. Skull: Calvarium is intact. No focal lytic or blastic lesions are present. No significant extracranial soft tissue lesion is present. Sinuses/Orbits: The paranasal sinuses and mastoid air cells are clear. The globes and orbits are within normal limits. IMPRESSION: 1. 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 2. Focal hyperdensity along the superomedial aspect of the encephalomalacia is more sharply defined on this study. This likely represents calcification related to the prior infarcts. No definite blood products are seen. 3. Stable remote infarct of the posterior left operculum. Of note a similar linear calcification is present. 4. Follow-up head CT in 24-48 hours could be used to confirm stability of these hyperdensities. Electronically Signed   By: San Morelle M.D.   On: 02/10/2022 08:30   MR BRAIN WO CONTRAST  Result Date: 02/10/2022 CLINICAL DATA:  Possible subarachnoid hemorrhage.  Stroke suspected. EXAM: MRI HEAD WITHOUT CONTRAST TECHNIQUE: Multiplanar, multiecho pulse sequences of the brain and surrounding structures were obtained without intravenous  contrast. COMPARISON:  Head CT 02/10/2022 FINDINGS: Brain: There is a small left frontal cortical/subcortical acute infarct and bilateral punctate cerebellar acute infarcts. Old right frontal infarct is unchanged. No acute or chronic hemorrhage. There is multifocal hyperintense T2-weighted signal within the white matter. Generalized volume loss without a clear lobar predilection. The midline structures are normal. No focal abnormality to correlate with a small hyperdensity seen on the earlier CT. Vascular: Major flow voids are preserved. Skull and upper cervical spine: Normal calvarium and skull base. Visualized upper cervical spine and soft tissues are normal. Sinuses/Orbits:No paranasal sinus fluid levels or advanced mucosal thickening. No mastoid  or middle ear effusion. Normal orbits. IMPRESSION: 1. Small acute left frontal cortical/subcortical infarct and bilateral punctate cerebellar acute infarcts. No hemorrhage or mass effect. 2. Old right frontal infarct and findings of chronic small vessel disease. Electronically Signed   By: Ulyses Jarred M.D.   On: 02/10/2022 19:35   EEG adult  Result Date: 02/10/2022 Lora Havens, MD     02/10/2022  9:57 AM Patient Name: Fernando Rhodes MRN: [id number] Epilepsy Attending: Lora Havens Referring Physician/Provider: Gwinda Maine, MD Date: 02/10/2022 Duration: 25.24 mins Patient history:  66 y.o. male PMHx DVT and PE on apixaban woke with seizure found to have [place]. EEG to evaluate for seizure Level of alertness: Awake AEDs during EEG study: LEV Technical aspects: This EEG study was done with scalp electrodes positioned according to the 10-20 International system of electrode placement. Electrical activity was acquired at a sampling rate of '500Hz'$  and reviewed with a high frequency filter of '70Hz'$  and a low frequency filter of '1Hz'$ . EEG data were recorded continuously and digitally stored. Description: The posterior dominant rhythm consists of 9-10 Hz activity of moderate voltage (25-35 uV) seen predominantly in posterior head regions, symmetric and reactive to eye opening and eye closing. Physiologic photic driving was seen during photic stimulation.  Hyperventilation was not performed.   IMPRESSION: This study is within normal limits. No seizures or epileptiform discharges were seen throughout the recording. Lora Havens   ECHOCARDIOGRAM COMPLETE BUBBLE STUDY  Result Date: 02/10/2022    ECHOCARDIOGRAM REPORT   Patient Name:   Fernando Rhodes Date of Exam: 02/10/2022 Medical Rec #:  [id number]            Height:       66.0 in Accession #:    [id number]           Weight:       168.9 lb Date of Birth:  08-Sep-1956            BSA:          1.861 m Patient Age:    5 years             BP:            130/85 mmHg Patient Gender: M                    HR:           61 bpm. Exam Location:  Inpatient Procedure: 2D Echo, Cardiac Doppler, Color Doppler and Saline Contrast Bubble            Study Indications:    CVA  History:        Patient has prior history of Echocardiogram examinations, most                 recent 05/31/2020. CHF; Risk Factors:Hypertension and Diabetes.  Sonographer:    Luisa Hart RDCS Referring Phys: [id number] JAN A MANSY  Sonographer Comments: Suboptimal apical window and suboptimal subcostal window. Image acquisition challenging due to patient body habitus. IMPRESSIONS  1. Left ventricular ejection fraction, by estimation, is 60 to 65%. The left ventricle has normal function. The left ventricle has no regional wall motion abnormalities. There is moderate left ventricular hypertrophy. Left ventricular diastolic parameters were normal.  2. Right ventricular systolic function is normal. The right ventricular size is normal.  3. Bubble study attempted but not diagnostic due to poor echo window.  4. The pericardial effusion is posterior to the left ventricle.  5. The mitral valve is abnormal. Mild mitral valve regurgitation. No evidence of mitral stenosis.  6. The aortic valve is tricuspid. There is mild calcification of the aortic valve. Aortic valve regurgitation is not visualized. Aortic valve sclerosis is present, with no evidence of aortic valve stenosis.  7. The inferior vena cava is normal in size with greater than 50% respiratory variability, suggesting right atrial pressure of 3 mmHg. FINDINGS  Left Ventricle: Left ventricular ejection fraction, by estimation, is 60 to 65%. The left ventricle has normal function. The left ventricle has no regional wall motion abnormalities. The left ventricular internal cavity size was normal in size. There is  moderate left ventricular hypertrophy. Left ventricular diastolic parameters were normal. Right Ventricle: The right ventricular size is normal. No  increase in right ventricular wall thickness. Right ventricular systolic function is normal. Left Atrium: Left atrial size was normal in size. Right Atrium: Right atrial size was normal in size. Pericardium: Trivial pericardial effusion is present. The pericardial effusion is posterior to the left ventricle. Mitral Valve: The mitral valve is abnormal. There is moderate thickening of the mitral valve leaflet(s). There is mild calcification of the mitral valve leaflet(s). Mild mitral valve regurgitation. No evidence of mitral valve stenosis. Tricuspid Valve: The tricuspid valve is normal in structure. Tricuspid valve regurgitation is trivial. No evidence of tricuspid stenosis. Aortic Valve: The aortic valve is tricuspid. There is mild calcification of the aortic valve. Aortic valve regurgitation is not visualized. Aortic valve sclerosis is present, with no evidence of aortic valve stenosis. Aortic valve mean gradient measures 2.0 mmHg. Aortic valve peak gradient measures 3.0 mmHg. Aortic valve area, by VTI measures 3.50 cm. Pulmonic Valve: The pulmonic valve was normal in structure. Pulmonic valve regurgitation is not visualized. No evidence of pulmonic stenosis. Aorta: The aortic root is normal in size and structure. Venous: The inferior vena cava is normal in size with greater than 50% respiratory variability, suggesting right atrial pressure of 3 mmHg. IAS/Shunts: The interatrial septum was not well visualized. Agitated saline contrast was given intravenously to evaluate for intracardiac shunting.  LEFT VENTRICLE PLAX 2D LVIDd:         4.10 cm     Diastology LVIDs:         2.40 cm     LV e' medial:    4.80 cm/s LV PW:         1.10 cm     LV E/e' medial:  11.5 LV IVS:        1.50 cm     LV e' lateral:   8.03 cm/s LVOT diam:     2.20 cm     LV E/e' lateral: 6.9 LV SV:         67 LV SV Index:   36 LVOT Area:     3.80 cm  LV Volumes (MOD)  LV vol d, MOD A2C: 62.4 ml LV vol d, MOD A4C: 59.0 ml LV vol s, MOD A2C: 27.6  ml LV vol s, MOD A4C: 20.3 ml LV SV MOD A2C:     34.8 ml LV SV MOD A4C:     59.0 ml LV SV MOD BP:      40.7 ml RIGHT VENTRICLE RV Basal diam:  2.90 cm RV Mid diam:    2.10 cm LEFT ATRIUM             Index        RIGHT ATRIUM           Index LA diam:        3.20 cm 1.72 cm/m   RA Area:     16.00 cm LA Vol (A2C):   34.0 ml 18.27 ml/m  RA Volume:   40.90 ml  21.98 ml/m LA Vol (A4C):   46.9 ml 25.20 ml/m LA Biplane Vol: 40.8 ml 21.92 ml/m  AORTIC VALVE                    PULMONIC VALVE AV Area (Vmax):    3.55 cm     PV Vmax:       0.61 m/s AV Area (Vmean):   3.36 cm     PV Vmean:      46.700 cm/s AV Area (VTI):     3.50 cm     PV VTI:        0.164 m AV Vmax:           86.30 cm/s   PV Peak grad:  1.5 mmHg AV Vmean:          58.600 cm/s  PV Mean grad:  1.0 mmHg AV VTI:            0.191 m AV Peak Grad:      3.0 mmHg AV Mean Grad:      2.0 mmHg LVOT Vmax:         80.60 cm/s LVOT Vmean:        51.800 cm/s LVOT VTI:          0.176 m LVOT/AV VTI ratio: 0.92  AORTA Ao Root diam: 2.90 cm Ao Asc diam:  3.20 cm MITRAL VALVE MV Area (PHT): 3.91 cm    SHUNTS MV Decel Time: 194 msec    Systemic VTI:  0.18 m MV E velocity: 55.20 cm/s  Systemic Diam: 2.20 cm MV A velocity: 70.60 cm/s MV E/A ratio:  0.78 Jenkins Rouge MD Electronically signed by Jenkins Rouge MD Signature Date/Time: 02/10/2022/10:56:35 AM    Final    CT HEAD CODE STROKE WO CONTRAST  Result Date: 02/10/2022 CLINICAL DATA:  Code stroke. EXAM: CT HEAD WITHOUT CONTRAST TECHNIQUE: Contiguous axial images were obtained from the base of the skull through the vertex without intravenous contrast. RADIATION DOSE REDUCTION: This exam was performed according to the departmental dose-optimization program which includes automated exposure control, adjustment of the mA and/or kV according to patient size and/or use of iterative reconstruction technique. COMPARISON:  None. 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 At the superomedial aspect of the area  of encephalomalacia, there is a small focus of subarachnoid hyperdensity. No mass effect. Vascular: No abnormal hyperdensity of the major intracranial arteries or dural venous sinuses. No intracranial atherosclerosis. Skull: The visualized skull base, calvarium and extracranial soft tissues are normal. Sinuses/Orbits: No fluid levels or advanced mucosal thickening of the visualized paranasal sinuses. No  mastoid or middle ear effusion. The orbits are normal. IMPRESSION: 1. Small focus of subarachnoid hyperdensity at the superomedial aspect of the area of encephalomalacia, favored to be a small amount of subarachnoid hemorrhage. 2. 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 Critical Value/emergent results were called by telephone at the time of interpretation on 02/10/2022 at 1:54 am to provider Lynnae Sandhoff, who verbally acknowledged these results. Electronically Signed   By: Ulyses Jarred M.D.   On: 02/10/2022 01:59   VAS [place] LOWER EXTREMITY VENOUS (DVT)  Result Date: 02/10/2022  Lower Venous DVT Study Patient Name:  Fernando Rhodes  Date of Exam:   02/10/2022 Medical Rec #: [id number]             Accession #:    [id number] Date of Birth: 08-25-1956             Patient Gender: M Patient Age:   11 years Exam Location:  [facility] Procedure:      VAS [place] LOWER EXTREMITY VENOUS (DVT) Referring Phys: Patryk Conant [place] --------------------------------------------------------------------------------  Indications: History of left lower extremity DVT,.  Anticoagulation: Previously on Eliquis, now with SAH. Comparison Study: 06-08-2020 Prior bilateral lower extremity venous was positive                   for acute DVT involving the left lower extremity. Performing Technologist: Darlin Coco RDMS, RVT  Examination Guidelines: A complete evaluation includes B-mode imaging, spectral Doppler, color Doppler, and power Doppler as needed of all accessible portions of each vessel. Bilateral testing is considered an integral part of  a complete examination. Limited examinations for reoccurring indications may be performed as noted. The reflux portion of the exam is performed with the patient in reverse Trendelenburg.  +---------+---------------+---------+-----------+----------+--------------+  RIGHT     Compressibility Phasicity Spontaneity Properties Thrombus Aging  +---------+---------------+---------+-----------+----------+--------------+  CFV       Full            Yes       Yes                                    +---------+---------------+---------+-----------+----------+--------------+  SFJ       Full                                                             +---------+---------------+---------+-----------+----------+--------------+  FV Prox   Full                                                             +---------+---------------+---------+-----------+----------+--------------+  FV Mid    Full                                                             +---------+---------------+---------+-----------+----------+--------------+  FV Distal Full                                                             +---------+---------------+---------+-----------+----------+--------------+  PFV       Full                                                             +---------+---------------+---------+-----------+----------+--------------+  POP       Full                                                             +---------+---------------+---------+-----------+----------+--------------+  PTV       Full                                                             +---------+---------------+---------+-----------+----------+--------------+  PERO      Full                                                             +---------+---------------+---------+-----------+----------+--------------+  Gastroc   Full                                                             +---------+---------------+---------+-----------+----------+--------------+    +---------+---------------+---------+-----------+----------+--------------+  LEFT      Compressibility Phasicity Spontaneity Properties Thrombus Aging  +---------+---------------+---------+-----------+----------+--------------+  CFV       Full            Yes       Yes                                    +---------+---------------+---------+-----------+----------+--------------+  SFJ       Full                                                             +---------+---------------+---------+-----------+----------+--------------+  FV Prox   None            No        No                     Chronic         +---------+---------------+---------+-----------+----------+--------------+  FV Mid    None            No        No  Chronic         +---------+---------------+---------+-----------+----------+--------------+  FV Distal None            No        No                     Chronic         +---------+---------------+---------+-----------+----------+--------------+  PFV       Full            Yes       Yes                                    +---------+---------------+---------+-----------+----------+--------------+  POP       Partial         Yes       Yes                    Chronic         +---------+---------------+---------+-----------+----------+--------------+  PTV       Full                                                             +---------+---------------+---------+-----------+----------+--------------+  PERO      Full                                                             +---------+---------------+---------+-----------+----------+--------------+  Gastroc   Full                                                             +---------+---------------+---------+-----------+----------+--------------+     Summary: RIGHT: - There is no evidence of deep vein thrombosis in the lower extremity.  - No cystic structure found in the popliteal fossa.  LEFT: - Findings consistent with chronic deep vein  thrombosis involving the left femoral vein, and left popliteal vein. - No cystic structure found in the popliteal fossa.  *See table(s) above for measurements and observations. Electronically signed by Orlie Pollen on 02/10/2022 at 7:28:18 PM.    Final    CT ANGIO HEAD NECK W WO CM (CODE STROKE)  Result Date: 02/10/2022 CLINICAL DATA:  Stroke follow-up EXAM: CT ANGIOGRAPHY HEAD AND NECK TECHNIQUE: Multidetector CT imaging of the head and neck was performed using the standard protocol during bolus administration of intravenous contrast. Multiplanar CT image reconstructions and MIPs were obtained to evaluate the vascular anatomy. Carotid stenosis measurements (when applicable) are obtained utilizing NASCET criteria, using the distal internal carotid diameter as the denominator. RADIATION DOSE REDUCTION: This exam was performed according to the departmental dose-optimization program which includes automated exposure control, adjustment of the mA and/or kV according to patient size and/or use of iterative reconstruction technique. CONTRAST:  15m OMNIPAQUE IOHEXOL 350 MG/ML SOLN COMPARISON:  None. FINDINGS: CTA NECK FINDINGS SKELETON: There is no  bony spinal canal stenosis. No lytic or blastic lesion. OTHER NECK: Normal pharynx, larynx and major salivary glands. No cervical lymphadenopathy. Unremarkable thyroid gland. UPPER CHEST: No pneumothorax or pleural effusion. No nodules or masses. AORTIC ARCH: Below the field of view. The visualized proximal subclavian arteries are widely patent. RIGHT CAROTID SYSTEM: Normal without aneurysm, dissection or stenosis. LEFT CAROTID SYSTEM: Normal without aneurysm, dissection or stenosis. VERTEBRAL ARTERIES: Left dominant configuration. Both origins are clearly patent. There is no dissection, occlusion or flow-limiting stenosis to the skull base (V1-V3 segments). CTA HEAD FINDINGS POSTERIOR CIRCULATION: --Vertebral arteries: Normal V4 segments. --Inferior cerebellar arteries:  Normal. --Basilar artery: Normal. --Superior cerebellar arteries: Normal. --Posterior cerebral arteries (PCA): Normal. ANTERIOR CIRCULATION: --Intracranial internal carotid arteries: Normal. --Anterior cerebral arteries (ACA): Normal. Both A1 segments are present. Patent anterior communicating artery (a-comm). --Middle cerebral arteries (MCA): Normal. VENOUS SINUSES: As permitted by contrast timing, patent. ANATOMIC VARIANTS: None Review of the MIP images confirms the above findings. IMPRESSION: 1. No emergent large vessel occlusion or high-grade stenosis of the intracranial arteries. 2. No dissection, aneurysm or hemodynamically significant stenosis of the carotid or vertebral arteries. Electronically Signed   By: Ulyses Jarred M.D.   On: 02/10/2022 03:10     Subjective: Patient seen examined bedside, resting comfortably.  No family present.  Updated patient's brother [name] via telephone.  Discharging home today.  No complaints or concerns at this time.  Questions answered.  Denies headache, no visual changes, no chest pain, palpitations, no shortness of breath, no abdominal pain, no weakness, no fatigue, no fever/chills/night sweats, no nausea/vomiting/diarrhea, no paresthesias.  No acute events overnight per nursing staff.  Discharge Exam: Vitals:   02/13/22 0356 02/13/22 0808  BP: (!) 149/89 (!) 151/98  Pulse: 82 88  Resp: 18 18  Temp: 98.1 F (36.7 C) 98.2 F (36.8 C)  SpO2: 94% 97%   Vitals:   02/12/22 2102 02/12/22 2346 02/13/22 0356 02/13/22 0808  BP: (!) 155/91 (!) 134/93 (!) 149/89 (!) 151/98  Pulse: 91 91 82 88  Resp: '17 17 18 18  '$ Temp: (!) 97.5 F (36.4 C) 98.5 F (36.9 C) 98.1 F (36.7 C) 98.2 F (36.8 C)  TempSrc: Oral Oral Oral   SpO2: 95% 93% 94% 97%  Weight:      Height:        Physical Exam: GEN: NAD, alert and oriented to person/place, wd/wn HEENT: NCAT, PERRL, EOMI, sclera clear, MMM PULM: CTAB w/o wheezes/crackles, normal respiratory effort CV: RRR w/o  M/G/R GI: abd soft, NTND, NABS, no R/G/M MSK: no peripheral edema, LLE strength 4/5, otherwise intact globally  NEURO: Left-sided facial droop, CN II-XII intact, no focal deficits, sensation to light touch intact PSYCH: normal mood/affect Integumentary: dry/intact, no rashes or wounds    The results of significant diagnostics from this hospitalization (including imaging, microbiology, ancillary and laboratory) are listed below for reference.     Microbiology: Recent Results (from the past 240 hour(s))  Resp Panel by RT-PCR (Flu A&B, Covid) Nasopharyngeal Swab     Status: None   Collection Time: 02/10/22  2:32 AM   Specimen: Nasopharyngeal Swab; Nasopharyngeal(NP) swabs in vial transport medium  Result Value Ref Range Status   SARS Coronavirus 2 by RT PCR NEGATIVE NEGATIVE Final    Comment: (NOTE) SARS-CoV-2 target nucleic acids are NOT DETECTED.  The SARS-CoV-2 RNA is generally detectable in upper respiratory specimens during the acute phase of infection. The lowest concentration of SARS-CoV-2 viral copies this assay can detect is 138 copies/mL.  A negative result does not preclude SARS-Cov-2 infection and should not be used as the sole basis for treatment or other patient management decisions. A negative result may occur with  improper specimen collection/handling, submission of specimen other than nasopharyngeal swab, presence of viral mutation(s) within the areas targeted by this assay, and inadequate number of viral copies(<138 copies/mL). A negative result must be combined with clinical observations, patient history, and epidemiological information. The expected result is Negative.  Fact Sheet for Patients:  EntrepreneurPulse.com.au  Fact Sheet for Healthcare Providers:  IncredibleEmployment.be  This test is no t yet approved or cleared by the [place] FDA and  has been authorized for detection and/or diagnosis of SARS-CoV-2  by FDA under an Emergency Use Authorization (EUA). This EUA will remain  in effect (meaning this test can be used) for the duration of the COVID-19 declaration under Section 564(b)(1) of the Act, 21 U.S.C.section 360bbb-3(b)(1), unless the authorization is terminated  or revoked sooner.       Influenza A by PCR NEGATIVE NEGATIVE Final   Influenza B by PCR NEGATIVE NEGATIVE Final    Comment: (NOTE) The Xpert Xpress SARS-CoV-2/FLU/RSV plus assay is intended as an aid in the diagnosis of influenza from Nasopharyngeal swab specimens and should not be used as a sole basis for treatment. Nasal washings and aspirates are unacceptable for Xpert Xpress SARS-CoV-2/FLU/RSV testing.  Fact Sheet for Patients: EntrepreneurPulse.com.au  Fact Sheet for Healthcare Providers: IncredibleEmployment.be  This test is not yet approved or cleared by the [place] FDA and has been authorized for detection and/or diagnosis of SARS-CoV-2 by FDA under an Emergency Use Authorization (EUA). This EUA will remain in effect (meaning this test can be used) for the duration of the COVID-19 declaration under Section 564(b)(1) of the Act, 21 U.S.C. section 360bbb-3(b)(1), unless the authorization is terminated or revoked.  Performed at [facility], [place] [address], [place], [place] [address]      Labs: BNP (last 3 results) No results for input(s): BNP in the last 8760 hours. Basic Metabolic Panel: Recent Labs  Lab 02/10/22 0139 02/10/22 0148 02/11/22 0430  NA 135 139 139  K 3.4* 3.5 3.3*  CL 103 105 105  CO2 16*  --  24  GLUCOSE 200* 188* 109*  BUN '15 18 11  '$ CREATININE 1.36* 1.40* 1.03  CALCIUM 8.6*  --  8.3*  MG  --   --  1.9   Liver Function Tests: Recent Labs  Lab 02/10/22 0139  AST 24  ALT 15  ALKPHOS 78  BILITOT 0.6  PROT 6.4*  ALBUMIN 3.6   No results for input(s): LIPASE, AMYLASE in the last 168 hours. No results for input(s): AMMONIA  in the last 168 hours. CBC: Recent Labs  Lab 02/10/22 0139 02/10/22 0148  WBC 7.9  --   NEUTROABS 3.7  --   HGB 14.7 13.9  HCT 42.8 41.0  MCV 95.7  --   PLT 200  --    Cardiac Enzymes: No results for input(s): CKTOTAL, CKMB, CKMBINDEX, TROPONINI in the last 168 hours. BNP: Invalid input(s): POCBNP CBG: Recent Labs  Lab 02/12/22 1217 02/12/22 1552 02/12/22 2103 02/13/22 0634 02/13/22 0803  GLUCAP 106* 139* 259* 135* 161*   D-Dimer No results for input(s): DDIMER in the last 72 hours. Hgb A1c No results for input(s): HGBA1C in the last 72 hours. Lipid Profile Recent Labs    02/11/22 0430  CHOL 141  HDL 51  LDLCALC 71  TRIG 97  CHOLHDL 2.8   Thyroid function studies No results for input(s): TSH, T4TOTAL, T3FREE, THYROIDAB in the last 72 hours.  Invalid input(s): FREET3 Anemia work up No results for input(s): VITAMINB12, FOLATE, FERRITIN, TIBC, IRON, RETICCTPCT in the last 72 hours. Urinalysis    Component Value Date/Time   COLORURINE YELLOW 05/28/2020 [place] 05/28/2020 0435   LABSPEC 1.023 05/28/2020 0435   PHURINE 5.0 05/28/2020 0435   GLUCOSEU 50 (A) 05/28/2020 0435   HGBUR LARGE (A) 05/28/2020 0435   BILIRUBINUR NEGATIVE 05/28/2020 0435   KETONESUR NEGATIVE 05/28/2020 0435   PROTEINUR NEGATIVE 05/28/2020 0435   NITRITE NEGATIVE 05/28/2020 0435   LEUKOCYTESUR NEGATIVE 05/28/2020 0435   Sepsis Labs Invalid input(s): PROCALCITONIN,  WBC,  LACTICIDVEN Microbiology Recent Results (from the past 240 hour(s))  Resp Panel by RT-PCR (Flu A&B, Covid) Nasopharyngeal Swab     Status: None   Collection Time: 02/10/22  2:32 AM   Specimen: Nasopharyngeal Swab; Nasopharyngeal(NP) swabs in vial transport medium  Result Value Ref Range Status   SARS Coronavirus 2 by RT PCR NEGATIVE NEGATIVE Final    Comment: (NOTE) SARS-CoV-2 target nucleic acids are NOT DETECTED.  The SARS-CoV-2 RNA is generally detectable in upper respiratory specimens  during the acute phase of infection. The lowest concentration of SARS-CoV-2 viral copies this assay can detect is 138 copies/mL. A negative result does not preclude SARS-Cov-2 infection and should not be used as the sole basis for treatment or other patient management decisions. A negative result may occur with  improper specimen collection/handling, submission of specimen other than nasopharyngeal swab, presence of viral mutation(s) within the areas targeted by this assay, and inadequate number of viral copies(<138 copies/mL). A negative result must be combined with clinical observations, patient history, and epidemiological information. The expected result is Negative.  Fact Sheet for Patients:  EntrepreneurPulse.com.au  Fact Sheet for Healthcare Providers:  IncredibleEmployment.be  This test is no t yet approved or cleared by the [place] FDA and  has been authorized for detection and/or diagnosis of SARS-CoV-2 by FDA under an Emergency Use Authorization (EUA). This EUA will remain  in effect (meaning this test can be used) for the duration of the COVID-19 declaration under Section 564(b)(1) of the Act, 21 U.S.C.section 360bbb-3(b)(1), unless the authorization is terminated  or revoked sooner.       Influenza A by PCR NEGATIVE NEGATIVE Final   Influenza B by PCR NEGATIVE NEGATIVE Final    Comment: (NOTE) The Xpert Xpress SARS-CoV-2/FLU/RSV plus assay is intended as an aid in the diagnosis of influenza from Nasopharyngeal swab specimens and should not be used as a sole basis for treatment. Nasal washings and aspirates are unacceptable for Xpert Xpress SARS-CoV-2/FLU/RSV testing.  Fact Sheet for Patients: EntrepreneurPulse.com.au  Fact Sheet for Healthcare Providers: IncredibleEmployment.be  This test is not yet approved or cleared by the [place] FDA and has been authorized for detection  and/or diagnosis of SARS-CoV-2 by FDA under an Emergency Use Authorization (EUA). This EUA will remain in effect (meaning this test can be used) for the duration of the COVID-19 declaration under Section 564(b)(1) of the Act, 21 U.S.C. section 360bbb-3(b)(1), unless the authorization is terminated or revoked.  Performed at [facility], [place] [address] [place], [place] [address]      Time coordinating discharge: Over 30 minutes  SIGNED:   Tayler Heiden J [place] DO  [facility] 02/13/2022, 10:30 AM

## 2022-02-13 NOTE — Progress Notes (Signed)
Occupational Therapy Treatment ?Patient Details ?Name: Fernando Rhodes ?MRN: [id number] ?DOB: 1956-04-20 ?Today's Date: 02/13/2022 ? ? ?History of present illness Pt is a 66 y.o. who presented [facility] on 3/3 with acute onset of suspected seizure.  Pt's wife found him rigid and stiff with left facial droop and shaking all over with froth coming out of his mouth and his eyes rolled back. He was unresponsive and confused with suspected postictal phase his seizures were aborted. 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 MRI pending. 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  ?OT comments ? Patient received in supine and agreeable to OT session. Patient was able to get to EOB without assistance and stood from EOB before bed alarm was turned off and without cueing. Patient given multiple step commands for grooming tasks standing at sink with patient able to follow 4/4 commands without further cues. Patient making good progress with OT treatment. Acute OT to continue to follow.   ? ?Recommendations for follow up therapy are one component of a multi-disciplinary discharge planning process, led by the attending physician.  Recommendations may be updated based on patient status, additional functional criteria and insurance authorization. ?   ?Follow Up Recommendations ? No OT follow up  ?  ?Assistance Recommended at Discharge Set up Supervision/Assistance  ?Patient can return home with the following ? A little help with walking and/or transfers;A little help with bathing/dressing/bathroom;Assistance with cooking/housework;Direct supervision/assist for medications management ?  ?Equipment Recommendations ? None recommended by OT  ?  ?Recommendations for Other Services   ? ?  ?Precautions /  Restrictions Precautions ?Precautions: Fall ?Precaution Comments: Seizure ?Restrictions ?Weight Bearing Restrictions: No  ? ? ?  ? ?Mobility Bed Mobility ?Overal bed mobility: Modified Independent ?  ?  ?  ?  ?  ?  ?General bed mobility comments: able to get from supine to sit without assistance ?  ? ?Transfers ?Overall transfer level: Needs assistance ?Equipment used: None ?Transfers: Sit to/from Stand ?Sit to Stand: Supervision ?  ?  ?  ?  ?  ?General transfer comment: supervision to perform toilet transfers ?  ?  ?Balance Overall balance assessment: Mild deficits observed, not formally tested ?  ?Sitting balance-Leahy Scale: Good ?  ?  ?  ?Standing balance-Leahy Scale: Fair ?  ?  ?  ?  ?  ?  ?  ?  ?  ?  ?  ?  ?   ? ?ADL either performed or assessed with clinical judgement  ? ?ADL Overall ADL's : Needs assistance/impaired ?  ?  ?Grooming: Wash/dry hands;Wash/dry face;Applying deodorant;Supervision/safety;Standing ?Grooming Details (indicate cue type and reason): performed grooming tasks standing at sink following 4 step commands ?  ?  ?  ?  ?  ?  ?  ?  ?Toilet Transfer: Supervision/safety;Ambulation;Regular Toilet ?Toilet Transfer Details (indicate cue type and reason): demonstrated good safety ?  ?  ?  ?  ?  ?General ADL Comments: self care tasks performed to address following multiple step commands with patient following 4/4 step commands ?  ? ?Extremity/Trunk Assessment Upper Extremity Assessment ?LUE Deficits / Details: difficulty flexing fingers, incoordination, motor planning deficit ?LUE Sensation: decreased light touch ?LUE Coordination: decreased fine motor;decreased gross motor ?  ?  ?  ?  ?  ? ?Vision   ?  ?  ?  Perception   ?  ?Praxis   ?  ? ?Cognition Arousal/Alertness: Awake/alert ?Behavior During Therapy: [facility] for tasks assessed/performed ?Overall Cognitive Status: History of cognitive impairments - at baseline ?  ?  ?  ?  ?  ?  ?  ?  ?  ?  ?  ?  ?  ?  ?  ?  ?General Comments: aware of date but not day  of the week ?  ?  ?   ?Exercises   ? ?  ?Shoulder Instructions   ? ? ?  ?General Comments    ? ? ?Pertinent Vitals/ Pain       Pain Assessment ?Pain Assessment: No/denies pain ?Faces Pain Scale: No hurt ? ?Home Living   ?  ?  ?  ?  ?  ?  ?  ?  ?  ?  ?  ?  ?  ?  ?  ?  ?  ?  ? ?  ?Prior Functioning/Environment    ?  ?  ?  ?   ? ?Frequency ? Min 2X/week  ? ? ? ? ?  ?Progress Toward Goals ? ?OT Goals(current goals can now be found in the care plan section) ? Progress towards OT goals: Progressing toward goals ? ?Acute Rehab OT Goals ?Patient Stated Goal: go back to [place] ?OT Goal Formulation: With patient ?Time For Goal Achievement: 02/25/22 ?Potential to Achieve Goals: Good ?ADL Goals ?Additional ADL Goal #1: Pt will follow multistep commands independently for 100% of the session. ?Additional ADL Goal #2: Pt will complete a multistep pathfinding task independently. ?Additional ADL Goal #3: Pt will complete a medication management task with 1 mistake or less.  ?Plan Discharge plan remains appropriate   ? ?Co-evaluation ? ? ?   ?  ?  ?  ?  ? ?  ?AM-PAC OT "6 Clicks" Daily Activity     ?Outcome Measure ? ? Help from another person eating meals?: A Little ?Help from another person taking care of personal grooming?: A Little ?Help from another person toileting, which includes using toliet, bedpan, or urinal?: A Little ?Help from another person bathing (including washing, rinsing, drying)?: A Little ?Help from another person to put on and taking off regular upper body clothing?: A Little ?Help from another person to put on and taking off regular lower body clothing?: A Little ?6 Click Score: 18 ? ?  ?End of Session Equipment Utilized During Treatment: Gait belt ? ?OT Visit Diagnosis: Unsteadiness on feet (R26.81);Other abnormalities of gait and mobility (R26.89);Muscle weakness (generalized) (M62.81) ?  ?Activity Tolerance Patient tolerated treatment well ?  ?Patient Left in chair;with call bell/phone within reach;with  chair alarm set ?  ?Nurse Communication Mobility status ?  ? ?   ? ?Time: 0258-5277 ?OT Time Calculation (min): 17 min ? ?Charges: OT General Charges ?$OT Visit: 1 Visit ?OT Treatments ?$Self Care/Home Management : 8-22 mins ? ?Lodema Hong, OTA ?Acute Rehabilitation Services  ?Pager [phone] ?Office [phone] ? ? ?Trixie Dredge ?02/13/2022, 8:11 AM ?

## 2022-02-13 NOTE — Progress Notes (Addendum)
Physical Therapy Treatment and Discharge ?Patient Details ?Name: Fernando Rhodes ?MRN: [id number] ?DOB: 1955-12-23 ?Today's Date: 02/13/2022 ? ? ?History of Present Illness Pt is a 66 y.o. who presented to [facility] on 3/3 with acute onset of suspected seizure.  Pt's wife found him rigid and stiff with left facial droop and shaking all over with froth coming out of his mouth and his eyes rolled back. He was unresponsive and confused with suspected postictal phase his seizures were aborted. 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 MRI pending. 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 ?  ?PT Comments  ? ? Patient mobilizing at his baseline (chronic left sided deficits). Walking independently and scored 18/24 on DGI (most points lost due to chronic deficits--no imbalance during assessment). All goals met and discharge from PT. ?   ?Recommendations for follow up therapy are one component of a multi-disciplinary discharge planning process, led by the attending physician.  Recommendations may be updated based on patient status, additional functional criteria and insurance authorization. ? ?Follow Up Recommendations ? No PT follow up ?  ?  ?Assistance Recommended at Discharge PRN  ?Patient can return home with the following Assist for transportation;Assistance with cooking/housework ?  ?Equipment Recommendations ? None recommended by PT  ?  ?Recommendations for Other Services   ? ? ?  ?Precautions / Restrictions Precautions ?Precautions: Fall ?Precaution Comments: Seizure ?Restrictions ?Weight Bearing Restrictions: No  ?  ? ?Mobility ? Bed Mobility ?Overal bed mobility: Modified Independent ?  ?  ?  ?  ?  ?  ?General bed mobility comments: able to get from supine to/from sit without  assistance ?  ? ?Transfers ?Overall transfer level: Needs assistance ?Equipment used: None ?Transfers: Sit to/from Stand ?Sit to Stand: Independent ?  ?  ?  ?  ?  ?General transfer comment: from eOB with no difficulty ?  ? ?Ambulation/Gait ?Ambulation/Gait assistance: Independent ?Gait Distance (Feet): 500 Feet ?Assistive device: None ?Gait Pattern/deviations: Step-through pattern, Decreased dorsiflexion - left, Decreased stride length ?Gait velocity: WNL; able to slow down and speed up ?Gait velocity interpretation: >2.62 ft/sec, indicative of community ambulatory ?  ?General Gait Details: These deficits are from baseline, mild L impairments ? ? ?Stairs ?  ?  ?  ?  ?  ? ? ?Wheelchair Mobility ?  ? ?Modified Rankin (Stroke Patients Only) ?Modified Rankin (Stroke Patients Only) ?Pre-Morbid Rankin Score: No significant disability ?Modified Rankin: No significant disability ? ? ?  ?Balance Overall balance assessment: Mild deficits observed, not formally tested ?  ?Sitting balance-Leahy Scale: Good ?  ?  ?  ?Standing balance-Leahy Scale: Good ?  ?  ?  ?  ?  ?  ?  ?  ?High Level Balance Comments: Pt. shows good control throughout gait and is able to tolerate moderate balance challenges ?Standardized Balance Assessment ?Standardized Balance Assessment : Dynamic Gait Index ?  ?Dynamic Gait Index ?Level Surface: Mild Impairment ?Change in Gait Speed: Normal ?Gait with Horizontal Head Turns: Mild Impairment ?Gait with Vertical Head Turns: Mild Impairment ?Gait and Pivot Turn: Normal ?Step Over Obstacle: Mild Impairment ?Step Around Obstacles: Mild Impairment ?Steps: Mild Impairment ?Total Score: 18 ?  ? ?  ?Cognition Arousal/Alertness: Awake/alert ?Behavior During Therapy: [facility] for tasks assessed/performed ?Overall Cognitive Status: History of cognitive impairments - at baseline ?  ?  ?  ?  ?  ?  ?  ?  ?  ?  ?  ?  ?  ?  ?  ?  ?  General Comments: difficulty with some multi-step commands; reports he is baseline (?) ?  ?  ? ?   ?Exercises   ? ?  ?General Comments   ?  ?  ? ?Pertinent Vitals/Pain Pain Assessment ?Pain Assessment: No/denies pain ?Faces Pain Scale: No hurt  ? ? ?Home Living   ?  ?  ?  ?  ?  ?  ?  ?  ?  ?   ?  ?Prior Function    ?  ?  ?   ? 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 ?  ?Frequency ? ? ?   ? ? ? ?  ?PT Plan Current plan remains appropriate  ? ? ?Co-evaluation   ?  ?  ?  ?  ? ?  ?AM-PAC PT "6 Clicks" Mobility   ?Outcome Measure ? Help needed turning from your back to your side while in a flat bed without using bedrails?: None ?Help needed moving from lying on your back to sitting on the side of a flat bed without using bedrails?: None ?Help needed moving to and from a bed to a chair (including a wheelchair)?: None ?Help needed standing up from a chair using your arms (e.g., wheelchair or bedside chair)?: None ?Help needed to walk in hospital room?: None ?Help needed climbing 3-5 steps with a railing? : None ?6 Click Score: 24 ? ?  ?End of Session Equipment Utilized During Treatment: Gait belt ?Activity Tolerance: Patient tolerated treatment well ?Patient left: in bed;with call bell/phone within reach;with bed alarm set ?Nurse Communication: Other (comment) (dc from PT) ?PT Visit Diagnosis: Other abnormalities of gait and mobility (R26.89);Ataxic gait (R26.0);Hemiplegia and hemiparesis ?Hemiplegia - Right/Left: Left ?Hemiplegia - dominant/non-dominant: Non-dominant ?Hemiplegia - caused by: Unspecified (From baseline) ?  ?PT Discharge Note ? ?Patient is being discharged from PT services secondary to: ? ?Goals met and no further therapy needs identified. ? ?Please see latest Therapy Progress Note for current level  of functioning and progress toward goals. ? ?Progress and discharge plan and discussed with patient/caregiver and they ? ?Agree ? ? ?Time: [phone] ?PT Time Calculation (min) (ACUTE ONLY): 12 min ? ?Charges:  $Gait Training: 8-22 mins          ?          ? ? ?Arby Barrette, PT ?Acute Rehabilitation Services  ?Pager [phone] ?Office [phone] ? ? ? ?Jeanie Cooks Aleigha Gilani ?02/13/2022, 9:12 AM ? ?

## 2022-02-22 ENCOUNTER — Other Ambulatory Visit (HOSPITAL_COMMUNITY): Payer: Self-pay

## 2022-02-28 ENCOUNTER — Other Ambulatory Visit (HOSPITAL_COMMUNITY): Payer: Self-pay

## 2022-03-06 ENCOUNTER — Other Ambulatory Visit (HOSPITAL_COMMUNITY): Payer: Self-pay

## 2022-03-07 ENCOUNTER — Other Ambulatory Visit (HOSPITAL_COMMUNITY): Payer: Self-pay

## 2022-03-13 NOTE — Progress Notes (Signed)
? ? ?Patient: Fernando Rhodes ?Date of Birth: Sep 20, 1956 ? ?Reason for Visit: stroke follow-up  ?History from: Patient, brother in law, [name]  ?Primary Neurologist: Saw Dr. Erlinda Hong in hospital  ? ?[name] ?66 y.o. year old male  ? ?1.  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 ?2.  Seizure ?-Single witnessed seizure, routine EEG was normal  ?-Initial CT head favored small focus of SAH at superomedial aspect of the area of encephalomalacia; repeat CT head felt the focal hyperdensity along the superomedial aspect of encephalomalacia likely represents calcification related to prior infarcts, no definite blood products were seen ?-Continue Keppra 500 mg twice daily, at risk for recurrent seizure for stroke history ? ?3.  Hypertension ?-Goal less than 130/80 ?-BP up today, could be white coat, need to check at home, if continues to be higher than goal, contact PCP, review of chart indicates on Lisinopril/HCTZ before hospital admision, not any longer, reportedly was dropping to low? ?-Get BP under control before returning to work ? ?4.  Hyperlipidemia ?-LDL 71, goal less than 70 ?-On simvastatin ? ?5.  Type 2 diabetes ?-A1c 5.7, goal less than 7 ?-On metformin ? ?6.  History of DVT, PE ?-Eliquis failure, now on Pradaxa ? ?For any acute stroke symptoms, he should go to the ER immediately.  I will see back in 8 to 12 months or sooner if needed, will follow him for seizures.  ? ?HISTORY OF PRESENT ILLNESS: ?Today 03/14/22 ?Fernando Rhodes here today for follow-up, 02/09/22 woke from sleep because of leg cramp, had witnessed seizure was full body shaking, snoring respirations. When EMS arrived, left-sided facial droop and weakness.  CT head showed small SAH,  likely etiology of seizure, given IV Keppra.  Left-sided weakness chronic since childhood, but left-sided facial droop was new.  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 ? ?Here today with brother in law, [name]. Still has chronic left sided weakness, facial droop resolved. Is back to baseline. Lives with wife, he never had drivers license. Remains on Keppra 500 mg twice daily. Tolerating well. No seizures. 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 Not taking BP medication currently. Has seen PCP. Denies headaches. Doing well, back at baseline.  ? ?-MRI showed small acute left frontal cortical/subcortical infarct and bilateral punctate cerebellar acute infarcts.  Bilateral MCA old infarcts.  ?-CTA head and neck unremarkable.   ?-Repeat CT head showed stable appearance of right MCA infarct and encephalomalacia.  Focal hyperdensity along the superomedial aspect of the encephalomalacia likely represents calcification related to prior infarcts, there is no definite blood products seen ?-Routine EEG was normal ?-2D echo EF 60 to 65% ?-LDL 71 ?-A1c 5.7 ? ?HISTORY  ?Copied Dr. Erlinda Hong note 02/11/22: Fernando Rhodes is a 66 y.o. 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 He presented with left facial droop and left sided weakness especially in left hand. Wife noted generalized shaking and snoring like breathing, likely a seizure. He takes Eliquis for history of blood clot  and wife says they make sure he is compliant. Patient was found to have [place] and likely first time seizure, loaded with levetiracetam 1,'000mg'$  then continued on maintenance 500 mg BID. Routine EEG wnl. Left sided weakness is chronic since childhood, confirmed by sister, but L facial droop is new.   ? ?REVIEW OF SYSTEMS: Out of a complete 14 system review of symptoms, the patient complains only of the following symptoms, and all other reviewed systems are negative. ? ?See HPI ? ?ALLERGIES: ?No Known Allergies ? ?HOME MEDICATIONS: ?Outpatient Medications Prior to Visit  ?Medication Sig Dispense Refill  ? acetaminophen (TYLENOL) 500 MG tablet Take 1,000 mg by mouth every 6 (six) hours as needed for headache (pain).    ? dabigatran (PRADAXA) 150 MG CAPS capsule Take 1 capsule (150 mg total) by mouth 2 (two) times daily. 60 capsule 2  ? FERROUS SULFATE PO Take 1 tablet by mouth daily. Unsure of strength    ? levETIRAcetam (KEPPRA) 500 MG tablet Take 1 tablet (500 mg total) by mouth 2 (two) times daily. 60 tablet 2  ? Multiple Vitamins-Minerals (MULTIVITAMIN GUMMIES ADULT PO) Take 2 tablets by mouth daily. Take 2 gummies daily    ? simvastatin (ZOCOR) 20 MG tablet Take 20 mg by mouth at bedtime.     ? metFORMIN (GLUCOPHAGE) 500 MG tablet Take 1 tablet (500 mg total) by mouth 2 (two) times daily with a meal. 180 tablet 1  ? ?No facility-administered medications prior to visit.  ? ? ?PAST MEDICAL HISTORY: ?Past Medical History:  ?Diagnosis Date  ? Basal cell carcinoma of skin 03/14/2022  ? Diabetes mellitus without complication ([place])   ? Hypertension   ? Renal insufficiency 06/01/2020  ? ? ?PAST SURGICAL HISTORY: ?Past Surgical History:  ?Procedure Laterality Date  ? ABDOMINAL SURGERY    ? IR US GUIDE BX ASP/DRAIN  06/01/2020  ? ? ?FAMILY HISTORY: ?History reviewed. No pertinent family history. ? ?SOCIAL HISTORY: ?Social History  ? ?Socioeconomic History  ? Marital status: Single  ?  Spouse name: Not on file  ? Number of children: Not on file  ? Years of education: Not on file  ? Highest education level: Not on file  ?Occupational History  ? Not on file  ?Tobacco Use  ? Smoking status: Never  ? Smokeless tobacco: Never  ?Substance and Sexual Activity  ? Alcohol use: No  ? Drug use: No  ? Sexual activity: Not on file   ?Other Topics Concern  ? Not on file  ?Social History Narrative  ? Not on file  ? ?Social Determinants of Health  ? ?Financial Resource Strain: Not on file  ?Food Insecurity: Not on file  ?Transportation Needs: Not on file  ?Physical Activity: Not on file  ?Stress: Not on file  ?Social Connections: Not on file  ?Intimate Partner Violence: Not on file  ? ? ?PHYSICAL EXAM ? ?Vitals:  ? 03/14/22 0758 03/14/22 0800  ?BP: (!) 171/100 (!) 171/100  ?Pulse: 80 83  ?Weight: 165 lb 8 oz (75.1 kg)   ?Height: '5\' 9"'$  (1.753 m)   ? ?Body mass index is 24.44 kg/m?. ? ?Generalized: Well developed, in no acute distress  ?Neurological examination  ?Mentation: Alert oriented to time, place, history taking. Follows all commands, speech is slightly dysarthric, but is understandable. ?Cranial nerve II-XII: Mild asymmetry to left eye compared to right. Pupils were equal round reactive to light. Extraocular movements were full, visual field were full on confrontational test. Facial sensation and strength were  normal. Head turning and shoulder shrug  were normal and symmetric. Smile appears very slight droop on left. ?Motor: Mild weakness to the left arm and leg 4.5/5, atrophy of left arm noted, flexion of fingers ?Sensory: Decreased sensation to left face, arm, leg ?Coordination: Cerebellar testing reveals good finger-nose-finger and heel-to-shin bilaterally.  ?Gait and station: Gait is slightly wide-based, slight limp on the left, but is independent ?Reflexes: Deep tendon reflexes are symmetric but increased on the left ? ?DIAGNOSTIC DATA (LABS, IMAGING, TESTING) ?- I reviewed patient records, labs, notes, testing and imaging myself where available. ? ?Lab Results  ?Component Value Date  ? WBC 7.9 02/10/2022  ? HGB 13.9 02/10/2022  ? HCT 41.0 02/10/2022  ? MCV 95.7 02/10/2022  ? PLT 200 02/10/2022  ? ?   ?Component Value Date/Time  ? NA 139 02/11/2022 0430  ? K 3.3 (L) 02/11/2022 0430  ? CL 105 02/11/2022 0430  ? CO2 24 02/11/2022 0430  ?  GLUCOSE 109 (H) 02/11/2022 0430  ? BUN 11 02/11/2022 0430  ? CREATININE 1.03 02/11/2022 0430  ? CALCIUM 8.3 (L) 02/11/2022 0430  ? PROT 6.4 (L) 02/10/2022 0139  ? ALBUMIN 3.6 02/10/2022 0139  ? AST 24 02/10/2022

## 2022-03-14 ENCOUNTER — Other Ambulatory Visit (HOSPITAL_COMMUNITY): Payer: Self-pay

## 2022-03-14 ENCOUNTER — Telehealth: Payer: Self-pay | Admitting: Neurology

## 2022-03-14 ENCOUNTER — Telehealth: Payer: Self-pay

## 2022-03-14 ENCOUNTER — Ambulatory Visit: Payer: 59 | Admitting: Neurology

## 2022-03-14 ENCOUNTER — Encounter: Payer: Self-pay | Admitting: Neurology

## 2022-03-14 VITALS — BP 150/70 | HR 83 | Ht 69.0 in | Wt 165.5 lb

## 2022-03-14 DIAGNOSIS — C4491 Basal cell carcinoma of skin, unspecified: Secondary | ICD-10-CM

## 2022-03-14 DIAGNOSIS — I700 Atherosclerosis of aorta: Secondary | ICD-10-CM | POA: Insufficient documentation

## 2022-03-14 DIAGNOSIS — E785 Hyperlipidemia, unspecified: Secondary | ICD-10-CM | POA: Insufficient documentation

## 2022-03-14 DIAGNOSIS — I609 Nontraumatic subarachnoid hemorrhage, unspecified: Secondary | ICD-10-CM

## 2022-03-14 DIAGNOSIS — M2170 Unequal limb length (acquired), unspecified site: Secondary | ICD-10-CM | POA: Insufficient documentation

## 2022-03-14 DIAGNOSIS — K648 Other hemorrhoids: Secondary | ICD-10-CM | POA: Insufficient documentation

## 2022-03-14 DIAGNOSIS — I639 Cerebral infarction, unspecified: Secondary | ICD-10-CM | POA: Diagnosis not present

## 2022-03-14 DIAGNOSIS — R569 Unspecified convulsions: Secondary | ICD-10-CM | POA: Diagnosis not present

## 2022-03-14 DIAGNOSIS — K5730 Diverticulosis of large intestine without perforation or abscess without bleeding: Secondary | ICD-10-CM | POA: Insufficient documentation

## 2022-03-14 DIAGNOSIS — Z8249 Family history of ischemic heart disease and other diseases of the circulatory system: Secondary | ICD-10-CM | POA: Insufficient documentation

## 2022-03-14 DIAGNOSIS — M179 Osteoarthritis of knee, unspecified: Secondary | ICD-10-CM | POA: Insufficient documentation

## 2022-03-14 DIAGNOSIS — I82459 Acute embolism and thrombosis of unspecified peroneal vein: Secondary | ICD-10-CM | POA: Insufficient documentation

## 2022-03-14 HISTORY — DX: Basal cell carcinoma of skin, unspecified: C44.91

## 2022-03-14 MED ORDER — LEVETIRACETAM 500 MG PO TABS
500.0000 mg | ORAL_TABLET | Freq: Two times a day (BID) | ORAL | 3 refills | Status: DC
  Filled 2022-03-14: qty 180, 90d supply, fill #0

## 2022-03-14 MED ORDER — DABIGATRAN ETEXILATE MESYLATE 150 MG PO CAPS
150.0000 mg | ORAL_CAPSULE | Freq: Two times a day (BID) | ORAL | 5 refills | Status: DC
  Filled 2022-03-14 – 2022-04-06 (×2): qty 60, 30d supply, fill #0
  Filled 2022-05-09: qty 60, 30d supply, fill #1
  Filled 2022-06-04: qty 60, 30d supply, fill #2
  Filled 2022-06-12 – 2022-06-28 (×2): qty 60, 30d supply, fill #3
  Filled 2022-08-03: qty 60, 30d supply, fill #4

## 2022-03-14 MED ORDER — DABIGATRAN ETEXILATE MESYLATE 150 MG PO CAPS
150.0000 mg | ORAL_CAPSULE | Freq: Two times a day (BID) | ORAL | 5 refills | Status: DC
  Filled 2022-03-14: qty 60, 30d supply, fill #0

## 2022-03-14 MED ORDER — LEVETIRACETAM 500 MG PO TABS
500.0000 mg | ORAL_TABLET | Freq: Two times a day (BID) | ORAL | 3 refills | Status: DC
  Filled 2022-03-14 – 2022-04-06 (×2): qty 180, 90d supply, fill #0
  Filled 2022-07-07: qty 180, 90d supply, fill #1

## 2022-03-14 NOTE — Telephone Encounter (Signed)
Informed pt that letter is ready for pick up or he can print from [place].  ?Appt was made for PCP tomorrow am to check BP. ?

## 2022-03-14 NOTE — Telephone Encounter (Signed)
Requested by NP to call back with vitals taken from Fernando Rhodes. Pt BP 146/90, pulse 85, took second time 157/82 pulse 88.  ?

## 2022-03-14 NOTE — Telephone Encounter (Signed)
Noted. Thanks for call back. We discussed BP goal post stroke < 130/80. It looked like his BP med was stopped in hospital, I am sure PCP will address if needs restarting.  ?

## 2022-03-14 NOTE — Telephone Encounter (Signed)
I spoke to the patient and his wife. They took his BP readings to his PCP office today. They are waiting on a call back for further instructions about restarting his medication.  ?

## 2022-03-14 NOTE — Patient Instructions (Addendum)
Continue current medications ?Get a BP cuff, monitor at home, goal should be < 130/80 ?Continue to see primary care  ?See you back in 8-12 months ?Go to ER for any acute issues ?

## 2022-03-15 ENCOUNTER — Other Ambulatory Visit (HOSPITAL_COMMUNITY): Payer: Self-pay

## 2022-03-15 ENCOUNTER — Other Ambulatory Visit (HOSPITAL_BASED_OUTPATIENT_CLINIC_OR_DEPARTMENT_OTHER): Payer: Self-pay

## 2022-03-15 MED ORDER — AMLODIPINE BESYLATE 5 MG PO TABS
ORAL_TABLET | ORAL | 3 refills | Status: DC
  Filled 2022-03-15 (×2): qty 90, 90d supply, fill #0
  Filled 2022-05-31: qty 90, 90d supply, fill #1
  Filled 2022-06-04 – 2022-09-04 (×5): qty 90, 90d supply, fill #2
  Filled 2022-11-10 – 2022-11-17 (×3): qty 90, 90d supply, fill #3

## 2022-03-16 NOTE — Progress Notes (Signed)
I agree with the above plan 

## 2022-04-03 ENCOUNTER — Other Ambulatory Visit (HOSPITAL_COMMUNITY): Payer: Self-pay

## 2022-04-04 ENCOUNTER — Other Ambulatory Visit (HOSPITAL_COMMUNITY): Payer: Self-pay

## 2022-04-04 ENCOUNTER — Other Ambulatory Visit: Payer: Self-pay

## 2022-04-04 MED ORDER — SIMVASTATIN 20 MG PO TABS
20.0000 mg | ORAL_TABLET | Freq: Every day | ORAL | 2 refills | Status: DC
  Filled 2022-04-04: qty 90, 90d supply, fill #0
  Filled 2022-06-29: qty 90, 90d supply, fill #1
  Filled 2022-09-27: qty 90, 90d supply, fill #2

## 2022-04-04 MED ORDER — METFORMIN HCL 500 MG PO TABS
500.0000 mg | ORAL_TABLET | Freq: Two times a day (BID) | ORAL | 2 refills | Status: DC
  Filled 2022-04-04: qty 180, 90d supply, fill #0
  Filled 2022-06-28: qty 180, 90d supply, fill #1
  Filled 2022-09-27: qty 180, 90d supply, fill #2

## 2022-04-06 ENCOUNTER — Other Ambulatory Visit (HOSPITAL_COMMUNITY): Payer: Self-pay

## 2022-05-05 ENCOUNTER — Other Ambulatory Visit (HOSPITAL_COMMUNITY): Payer: Self-pay

## 2022-05-09 ENCOUNTER — Other Ambulatory Visit (HOSPITAL_COMMUNITY): Payer: Self-pay

## 2022-05-09 ENCOUNTER — Telehealth: Payer: Self-pay | Admitting: Neurology

## 2022-05-09 NOTE — Telephone Encounter (Signed)
I spoke to the patient's wife. Her husband would like to return to work. 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 Feels he is unable to understand computer work since having his stroke. However, he does feel he can continue with his other responsibilities (directing cars onto carwash track, spraying soap on cars, washing/folding towels, emptying trash, straightening up work area, Warden/ranger). She is going to drop off paperwork from his employer this week. She is aware we are closed on Fridays.

## 2022-05-09 NOTE — Telephone Encounter (Signed)
Pt's brother-in-law, Fernando Rhodes (not on DPR) Calling on behalf the pt. Employer will allow pt to return to work until form are fill out by the physician to return to work. Would like a call from the nurse to patient's wife, Fernando Rhodes, [phone]  Informed Mr. Fernando Rhodes due not on DPR is allowed to discuss pt information with you.

## 2022-05-10 ENCOUNTER — Other Ambulatory Visit (HOSPITAL_COMMUNITY): Payer: Self-pay

## 2022-05-16 NOTE — Telephone Encounter (Signed)
Pt's wife,  Norberta Keens (not a DPR on file) inquiring about paperwork left at last office visit. Need paperwork filled out for him to return to work. His employer requesting the paperwork. Would like a call back.

## 2022-05-17 NOTE — Telephone Encounter (Addendum)
I have returned the call. 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 He may return to work. He would not be able to operate a motor vehicle or heavy machinery until six months seizure-free (seizure occurred 02/10/22). Per his wife, he will need assistance w/ Engineer, mining. This is not listed as a duty, skill or requirement in his job description. We stated his need but indicated it would not hinder him from doing fulfilling his role. Provided to medical records. They will pick up the paperwork today from our office.

## 2022-05-25 ENCOUNTER — Telehealth: Payer: Self-pay | Admitting: Neurology

## 2022-05-25 NOTE — Telephone Encounter (Signed)
Chart reviewed, seen for stroke follow up 03/14/22 accompanied by his brother-in-law, there was no mention of any memory loss.  He has never driven, and never had a driver's license.  He was felt to be completely back at baseline.  If this is a new issue that PCP feels needs to be evaluated, we may need to get a new referral.  In regards to his disability, he was very motivated to get back to work at [employer] he has worked for for 40 years.

## 2022-05-25 NOTE — Telephone Encounter (Signed)
I called the pt's wife back and we discussed.Pt is having issues with short term memory loss. He was evaluated by his PCP 3-4 weeks ago but wife did not mention this issue.   PCP is also discussing disability options with the pt, but wants updated input from the provider.  Reports his job did not let him resume work after letter was provided that he could.

## 2022-05-25 NOTE — Telephone Encounter (Signed)
Pt's wife, Norberta Keens would like a call from the nurse to discuss a sooner appt. Pt getting up taking his night that has been put out for him, having some memory issue.   Informed Ms. Collins pt would need to see PCP.

## 2022-05-25 NOTE — Telephone Encounter (Signed)
Noted, thank you

## 2022-05-31 ENCOUNTER — Other Ambulatory Visit (HOSPITAL_COMMUNITY): Payer: Self-pay

## 2022-06-05 ENCOUNTER — Other Ambulatory Visit (HOSPITAL_COMMUNITY): Payer: Self-pay

## 2022-06-07 ENCOUNTER — Other Ambulatory Visit (HOSPITAL_COMMUNITY): Payer: Self-pay

## 2022-06-12 ENCOUNTER — Other Ambulatory Visit (HOSPITAL_COMMUNITY): Payer: Self-pay

## 2022-06-14 NOTE — Progress Notes (Unsigned)
Patient: Fernando Rhodes Date of Birth: [birth year]  Reason for Visit: stroke follow-up  History from: Patient, brother in law, Fernando Rhodes, wife  Primary Neurologist: Saw Dr. Erlinda Hong in hospital  ASSESSMENT AND PLAN 66 y.o. year old male   55.  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  Memory change, couldn't not obtain [place] today 3.  Seizure, single seizure 02/10/22, at high risk for recurrent seizure with history of stroke, TBI 4.  History of prolonged febrile illness as a child with subsequent traumatic brain injury, chronic left-sided weakness, intellectual disability 5.  History of DVT, PE -Concern for mood changes, worsening left sided weakness, fatigue since stroke event. -Start Lamictal titrating up to 100 mg twice a day, will then wean off Keppra, concerned Keppra having adverse effects to the mood -Check EEG -Check MRI of the brain -Continue Pradaxa, previously had Eliquis failure -He is pursuing disability, PCP will complete, his job is under new management, has implemented computer systems that have been difficult for him to navigate, unclear he could have done this pre-stroke event, I think it is a reasonable option for him to explore disability at this point  -Follow up here in 3 months with Dr. Leonie Man or sooner if needed   Meds ordered this encounter  Medications   lamoTRIgine (LAMICTAL) 25 MG tablet    Sig: Take 1 tablet (25 mg total) by mouth 2 times daily for 7 days, THEN 2 tablets (50 mg total) 2 times daily for 7 days, THEN 3 tablets (75 mg total) 2 times daily for 7 days, THEN 4 tablets (100 mg total) 2 times daily for 7 days.    Dispense:  140 tablet    Refill:  0    After finish titration with small dose of lamotrigine 25 mg, change to lamotrigine 100 mg twice a day   HISTORY OF PRESENT  ILLNESS: Today 06/15/22 Here today for earlier visit. 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 They don't understand his limitations. The previous owners sold. [name] had issues before the stroke. Since his stroke, the car wash implemented a computer system, he can't operate. Since stroke, changes in his behavior, he is more forgetful. Needs help managing medication, double dosed 1 time, gets agitated/irritating with any questioning. His left side has always been his weak side, question if feeling weaker, could be because not about work/sitting around more, hasn't worked since end of April. His work claimed he has slowed down. As a child, he had prolonged fever illness that left him with left sided deficits, brain injury. He didn't want to complete MOCA testing today.  Tearful about not being able to contribute financially.  He remains on Keppra, Pradaxa.  Update 03/14/22 SS; [name] here today for follow-up, 02/09/22 woke from sleep because of leg cramp, had witnessed seizure was full body shaking, snoring respirations. When EMS arrived, left-sided facial droop and weakness.  CT head showed small SAH, likely etiology of seizure, given IV Keppra.  Left-sided weakness chronic since childhood, but left-sided facial droop was new.  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  Here today with brother in  law, Fernando Rhodes. Still has chronic left sided weakness, facial droop resolved. Is back to baseline. Lives with wife, he never had drivers license. Remains on Keppra 500 mg twice daily. Tolerating well. No seizures. 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 Not taking BP medication currently. Has seen PCP. Denies headaches. Doing well, back at baseline.   -MRI showed small  acute left frontal cortical/subcortical infarct and bilateral punctate cerebellar acute infarcts.  Bilateral MCA old infarcts.  -CTA head and neck unremarkable.   -Repeat CT head showed stable appearance of right MCA infarct and encephalomalacia.  Focal hyperdensity along the superomedial aspect of the encephalomalacia likely represents calcification related to prior infarcts, there is no definite blood products seen -Routine EEG was normal -2D echo EF 60 to 65% -LDL 71 -A1c 5.7  HISTORY  Copied Dr. Erlinda Hong note 02/11/22: Mr. Fernando Rhodes is a 66 y.o. 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 He presented with left facial droop and left sided weakness especially in left hand. Wife noted generalized shaking and snoring like breathing, likely a seizure. He takes Eliquis for history of blood clot and wife says they make sure he is compliant. Patient was found to have [place] and likely first time seizure, loaded with levetiracetam 1,'000mg'$  then continued on maintenance 500 mg BID. Routine EEG wnl. Left sided weakness is chronic since childhood, confirmed by sister, but L facial droop is new.   REVIEW OF SYSTEMS: Out of a complete 14 system review of symptoms, the patient complains only of the following symptoms, and all other reviewed systems are negative.  See HPI  ALLERGIES: No Known Allergies  HOME MEDICATIONS: Outpatient Medications Prior to Visit  Medication Sig Dispense Refill   acetaminophen (TYLENOL) 500 MG tablet Take 1,000 mg by mouth every 6 (six) hours as needed for headache (pain).     amLODipine (NORVASC) 5 MG tablet Take 1 tablet by mouth once daily 90 tablet 3   dabigatran (PRADAXA) 150 MG CAPS capsule Take 1 capsule (150 mg total) by mouth 2 (two) times daily. 60 capsule 5   FERROUS SULFATE PO Take 1 tablet by mouth daily. Unsure of strength     levETIRAcetam (KEPPRA) 500  MG tablet Take 1 tablet (500 mg total) by mouth 2 (two) times daily. 180 tablet 3   metFORMIN (GLUCOPHAGE) 500 MG tablet Take 1 tablet (500 mg total) by mouth 2 (two) times daily with a meal. 180 tablet 2   Multiple Vitamins-Minerals (MULTIVITAMIN GUMMIES ADULT PO) Take 2 tablets by mouth daily. Take 2 gummies daily     simvastatin (ZOCOR) 20 MG tablet Take 1 tablet (20 mg total) by mouth daily. 90 tablet 2   metFORMIN (GLUCOPHAGE) 500 MG tablet Take 1 tablet (500 mg total) by mouth 2 (two) times daily with a meal. 180 tablet 1   simvastatin (ZOCOR) 20 MG tablet Take 20 mg by mouth at bedtime.      No facility-administered medications prior to visit.    PAST MEDICAL HISTORY: Past Medical History:  Diagnosis Date   Basal cell carcinoma of skin 03/14/2022   Diabetes mellitus without complication ([place])    Hypertension    Renal insufficiency 06/01/2020    PAST SURGICAL HISTORY: Past Surgical History:  Procedure Laterality Date   ABDOMINAL SURGERY     IR  US GUIDE BX ASP/DRAIN  06/01/2020    FAMILY HISTORY: History reviewed. No pertinent family history.  SOCIAL HISTORY: Social History   Socioeconomic History   Marital status: Single    Spouse name: Not on file   Number of children: Not on file   Years of education: Not on file   Highest education level: Not on file  Occupational History   Not on file  Tobacco Use   Smoking status: Never   Smokeless tobacco: Never  Substance and Sexual Activity   Alcohol use: No   Drug use: No   Sexual activity: Not on file  Other Topics Concern   Not on file  Social History Narrative   Not on file   Social Determinants of Health   Financial Resource Strain: Not on file  Food Insecurity: Not on file  Transportation Needs: Not on file  Physical Activity: Not on file  Stress: Not on file  Social Connections: Not on file  Intimate Partner Violence: Not on file    PHYSICAL EXAM  Vitals:   06/15/22 0840  BP: 127/85  Pulse: 87   Weight: 165 lb (74.8 kg)  Height: '5\' 9"'$  (1.753 m)    Body mass index is 24.37 kg/m.  Generalized: Well developed, in no acute distress  Neurological examination  Mentation: Alert oriented to time, place, history taking. Follows all commands, speech is slightly dysarthric, but is understandable. Patient is quiet/somewhat withdrawn, history is obtained from family. Cranial nerve II-XII: Mild asymmetry to left eye compared to right. Pupils were equal round reactive to light. Extraocular movements were full, visual field were full on confrontational test. Facial sensation and strength were normal. Head turning and shoulder shrug  were normal and symmetric. Smile appears very slight droop on left. Motor: Mild weakness to the left arm and leg 4.5/5, atrophy of left arm noted, flexion of fingers Sensory: Decreased sensation to left face, arm, leg Coordination: Cerebellar testing reveals good finger-nose-finger and heel-to-shin bilaterally.  Gait and station: Gait is slightly wide-based, slightly more limp on the left that noted before, but is independent Reflexes: Deep tendon reflexes are symmetric but increased on the left  DIAGNOSTIC DATA (LABS, IMAGING, TESTING) - I reviewed patient records, labs, notes, testing and imaging myself where available.  Lab Results  Component Value Date   WBC 7.9 02/10/2022   HGB 13.9 02/10/2022   HCT 41.0 02/10/2022   MCV 95.7 02/10/2022   PLT 200 02/10/2022      Component Value Date/Time   NA 139 02/11/2022 0430   K 3.3 (L) 02/11/2022 0430   CL 105 02/11/2022 0430   CO2 24 02/11/2022 0430   GLUCOSE 109 (H) 02/11/2022 0430   BUN 11 02/11/2022 0430   CREATININE 1.03 02/11/2022 0430   CALCIUM 8.3 (L) 02/11/2022 0430   PROT 6.4 (L) 02/10/2022 0139   ALBUMIN 3.6 02/10/2022 0139   AST 24 02/10/2022 0139   ALT 15 02/10/2022 0139   ALKPHOS 78 02/10/2022 0139   BILITOT 0.6 02/10/2022 0139   GFRNONAA >60 02/11/2022 0430   GFRAA >60 06/04/2020 0422    Lab Results  Component Value Date   CHOL 141 02/11/2022   HDL 51 02/11/2022   LDLCALC 71 02/11/2022   TRIG 97 02/11/2022   CHOLHDL 2.8 02/11/2022   Lab Results  Component Value Date   HGBA1C 5.7 (H) 02/10/2022   No results found for: "VITAMINB12" No results found for: "TSH"  [name], AGNP-C, DNP 06/15/2022, 9:11 AM [facility]  [address], [place], [place] [address] [phone]

## 2022-06-15 ENCOUNTER — Encounter: Payer: Self-pay | Admitting: Neurology

## 2022-06-15 ENCOUNTER — Other Ambulatory Visit (HOSPITAL_COMMUNITY): Payer: Self-pay

## 2022-06-15 ENCOUNTER — Ambulatory Visit: Payer: 59 | Admitting: Neurology

## 2022-06-15 ENCOUNTER — Other Ambulatory Visit: Payer: Self-pay | Admitting: Neurology

## 2022-06-15 VITALS — BP 127/85 | HR 87 | Ht 69.0 in | Wt 165.0 lb

## 2022-06-15 DIAGNOSIS — I639 Cerebral infarction, unspecified: Secondary | ICD-10-CM

## 2022-06-15 DIAGNOSIS — R569 Unspecified convulsions: Secondary | ICD-10-CM

## 2022-06-15 MED ORDER — LAMOTRIGINE 25 MG PO TABS: ORAL_TABLET | ORAL | 0 refills | Status: DC

## 2022-06-15 MED ORDER — LAMOTRIGINE 25 MG PO TABS
ORAL_TABLET | ORAL | 0 refills | Status: DC
Start: 2022-06-15 — End: 2022-06-15
  Filled 2022-06-15: qty 140, 28d supply, fill #0

## 2022-06-15 NOTE — Patient Instructions (Addendum)
Start lamictal titration working up to 100 mg twice daily over 1 month, follow the titration schedule below, watch for rash  Once on Lamictal 100 mg twice daily will wean off Keppra  1 tablet twice a day for the first week 2 tablets twice a day for the second week 3 tablets twice a day for the third week 4 tablets twice a day for the fourth week  For total of 140 tablets  After finish titration with small dose of lamotrigine 25 mg, change to lamotrigine 100 mg twice a day  Check MRI brain  Check EEG

## 2022-06-16 ENCOUNTER — Other Ambulatory Visit (HOSPITAL_COMMUNITY): Payer: Self-pay

## 2022-06-16 MED ORDER — LAMOTRIGINE 25 MG PO TABS
ORAL_TABLET | ORAL | 0 refills | Status: DC
  Filled 2022-06-16: qty 140, 28d supply, fill #0

## 2022-06-20 ENCOUNTER — Telehealth: Payer: Self-pay | Admitting: Neurology

## 2022-06-20 NOTE — Telephone Encounter (Signed)
30 mins MR brain wo contrast Dr. April Manson [facility] Josem Kaufmann: [id number] exp. 06/16/22-09/16/22 scheduled at [facility] 06/21/22 at 9:30am

## 2022-06-21 ENCOUNTER — Ambulatory Visit: Payer: 59

## 2022-06-21 DIAGNOSIS — I639 Cerebral infarction, unspecified: Secondary | ICD-10-CM | POA: Diagnosis not present

## 2022-06-27 ENCOUNTER — Telehealth: Payer: Self-pay | Admitting: Neurology

## 2022-06-27 NOTE — Telephone Encounter (Signed)
Please call the patient, likely best to discuss with his wife or brother in law [name] brings him to appointments).  MRI of the brain did not show any acute changes.  It shows expected evolutionary changes from prior strokes.  There are no new strokes noted.  EEG is scheduled for this week.  Please check on how he is doing with the Lamictal titration.  IMPRESSION: MRI scan of the brain without contrast showing expected evolutionary changes in the left anterior frontal MCA branch infarct as well as remote age large right MCA and left posterior MCA branch infarcts.  No acute abnormalities noted.  Compared to MRI of dated 02/10/2022 expected evolutionary changes are noted.  There is no acute abnormality noted.

## 2022-06-27 NOTE — Telephone Encounter (Signed)
I spoke to the patient's brother, [name]. Provided him with the test results. States he is doing well on lamotrigine. He currently taking '50mg'$ , BID - still titrating the dose. No seizure-like activity. He will relay this information to his wife and the patient.

## 2022-06-28 ENCOUNTER — Other Ambulatory Visit (HOSPITAL_COMMUNITY): Payer: Self-pay

## 2022-06-29 ENCOUNTER — Ambulatory Visit: Payer: 59 | Admitting: *Deleted

## 2022-06-29 ENCOUNTER — Other Ambulatory Visit (HOSPITAL_COMMUNITY): Payer: Self-pay

## 2022-07-07 ENCOUNTER — Other Ambulatory Visit (HOSPITAL_COMMUNITY): Payer: Self-pay

## 2022-07-07 ENCOUNTER — Other Ambulatory Visit: Payer: Self-pay | Admitting: Neurology

## 2022-07-10 ENCOUNTER — Telehealth: Payer: Self-pay | Admitting: Neurology

## 2022-07-10 ENCOUNTER — Other Ambulatory Visit (HOSPITAL_COMMUNITY): Payer: Self-pay

## 2022-07-10 ENCOUNTER — Encounter: Payer: Self-pay | Admitting: Neurology

## 2022-07-10 ENCOUNTER — Other Ambulatory Visit: Payer: Self-pay | Admitting: Neurology

## 2022-07-10 NOTE — Telephone Encounter (Signed)
Pt's wife, Norberta Keens (no DPR on file) instructed by [name], NP to let her know when he got to 4 tablets to wean him off lamoTRIgine (LAMICTAL) 25 MG tablet . Would like a call from the nurse to clarify the medication.

## 2022-07-10 NOTE — Telephone Encounter (Signed)
LVM and mychart msg informing pt of need to reschedule EEG due to equipment issues

## 2022-07-10 NOTE — Telephone Encounter (Signed)
Wife(no DPR) has called, phone rep asked to speak with pt and she said he is not there and if he were he would not understand and would ask that we speak with her.  The terms and purpose of the DPR were explained to wife.  Phone rep informed her the message would be sent but, also explained there is no guarantee of a call back.  Wife states she wants a call from RN to confirm what pt needs to continue taking and to confirm what pt no longer needs to take.  There is confusion on the  lamoTRIgine (LAMICTAL) 25 MG tablet ., and the levETIRAcetam (KEPPRA) 500 MG tablet

## 2022-07-11 ENCOUNTER — Other Ambulatory Visit: Payer: 59 | Admitting: *Deleted

## 2022-07-11 ENCOUNTER — Telehealth: Payer: Self-pay | Admitting: Neurology

## 2022-07-11 ENCOUNTER — Other Ambulatory Visit (HOSPITAL_COMMUNITY): Payer: Self-pay

## 2022-07-11 MED ORDER — LAMOTRIGINE 100 MG PO TABS
100.0000 mg | ORAL_TABLET | Freq: Two times a day (BID) | ORAL | 3 refills | Status: DC
  Filled 2022-07-11: qty 180, 90d supply, fill #0
  Filled 2022-10-04: qty 180, 90d supply, fill #1
  Filled 2023-01-02: qty 180, 90d supply, fill #2
  Filled 2023-04-02: qty 180, 90d supply, fill #3

## 2022-07-11 NOTE — Telephone Encounter (Signed)
I called the patient and his wife.  He is doing well on Lamictal 100 mg twice a day.  I will send in the 100 mg tablet.  He remains on Keppra 500 mg twice a day, he will reduce to 1/2 tablet twice a day for 1 week, then stop.  EEG is scheduled for next week.  He is pursuing disability.  Denies any seizures.  Meds ordered this encounter  Medications   lamoTRIgine (LAMICTAL) 100 MG tablet    Sig: Take 1 tablet (100 mg total) by mouth 2 (two) times daily.    Dispense:  180 tablet    Refill:  3

## 2022-07-11 NOTE — Telephone Encounter (Signed)
Phone call made by [name], NP. Note in Epic.

## 2022-07-11 NOTE — Telephone Encounter (Signed)
Pt brother in law came into office to ask about lamoTRIgine (LAMICTAL) 100 MG tablet Pt understands his prescription has gone up to '100mg'$ . Pt brother in law would like to know if this prescription he can pick up now at pharmacy. Would like to know how to ween pt off of other medication, can not remember the name of other medication. Would like a call.  [phone].

## 2022-07-11 NOTE — Telephone Encounter (Signed)
[name], NP made a phone call to the patient's wife this morning.   Sarah's documentation from other message:   I called the patient and his wife.  He is doing well on Lamictal 100 mg twice a day.  I will send in the 100 mg tablet.  He remains on Keppra 500 mg twice a day, he will reduce to 1/2 tablet twice a day for 1 week, then stop.  EEG is scheduled for next week.  He is pursuing disability.  Denies any seizures.

## 2022-07-11 NOTE — Telephone Encounter (Signed)
Fernando Rhodes is aware that Fernando Rhodes spoke to the patient's wife this morning and provided the following information:  From Fernando Rhodes: I called the patient and his wife.  He is doing well on Lamictal 100 mg twice a day.  I will send in the 100 mg tablet.  He remains on Keppra 500 mg twice a day, he will reduce to 1/2 tablet twice a day for 1 week, then stop.  EEG is scheduled for next week. He is pursuing disability.  Denies any seizures.  He will make sure the patient is taking the medication correctly.

## 2022-07-13 ENCOUNTER — Telehealth: Payer: Self-pay | Admitting: Neurology

## 2022-07-13 NOTE — Telephone Encounter (Addendum)
I spoke to the patient's wife. She wanted to let us know that he is nearly finished with the lamotrigine '25mg'$  tablets. He is now taking four tabs twice daily without any side effects. She will pick up his new rx for lamotrigine '100mg'$ , one tab BID for his maintenance dose. He is still weaning off levetiracetam. He only has three more days before off. She feels he is doing well.

## 2022-07-13 NOTE — Telephone Encounter (Signed)
Wife is needing to speak to the RN to clear up confusion of the pt's medication and when he is to be weaned off of them. Please advise.

## 2022-07-17 ENCOUNTER — Other Ambulatory Visit: Payer: 59 | Admitting: *Deleted

## 2022-07-19 ENCOUNTER — Ambulatory Visit (INDEPENDENT_AMBULATORY_CARE_PROVIDER_SITE_OTHER): Payer: Commercial Managed Care - HMO | Admitting: Neurology

## 2022-07-19 DIAGNOSIS — R569 Unspecified convulsions: Secondary | ICD-10-CM | POA: Diagnosis not present

## 2022-07-24 ENCOUNTER — Telehealth: Payer: Self-pay | Admitting: Neurology

## 2022-07-24 NOTE — Telephone Encounter (Signed)
Please call, EEG was normal. No seizures were recorded. I saw them in the hallway when leaving. He is off Keppra and doing well on Lamictal 100 mg twice daily. Please make sure he continues this. Call for any seizures or issues. Thanks.   Summary  Normal electroencephalogram, awake, asleep and with activation procedures. There are no focal lateralizing or epileptiform features.

## 2022-07-24 NOTE — Telephone Encounter (Signed)
I called his wife. She is aware the EEG came back normal. She understands to continue lamotrigine '100mg'$ , one tablet twice daily.

## 2022-07-31 ENCOUNTER — Other Ambulatory Visit (HOSPITAL_COMMUNITY): Payer: Self-pay

## 2022-08-02 ENCOUNTER — Encounter: Payer: Self-pay | Admitting: Neurology

## 2022-08-03 ENCOUNTER — Other Ambulatory Visit (HOSPITAL_COMMUNITY): Payer: Self-pay

## 2022-08-07 ENCOUNTER — Telehealth: Payer: Self-pay | Admitting: Neurology

## 2022-08-07 ENCOUNTER — Other Ambulatory Visit (HOSPITAL_COMMUNITY): Payer: Self-pay

## 2022-08-07 MED ORDER — DABIGATRAN ETEXILATE MESYLATE 150 MG PO CAPS
150.0000 mg | ORAL_CAPSULE | Freq: Two times a day (BID) | ORAL | 5 refills | Status: DC
  Filled 2022-08-07 – 2022-08-08 (×3): qty 60, 30d supply, fill #0
  Filled 2022-09-04: qty 180, 90d supply, fill #1
  Filled 2022-09-04: qty 60, 30d supply, fill #1

## 2022-08-07 NOTE — Telephone Encounter (Signed)
Rx filled as per last OV note.

## 2022-08-07 NOTE — Addendum Note (Signed)
Addended by: Andre Lefort on: 08/07/2022 04:20 PM   Modules accepted: Orders

## 2022-08-07 NOTE — Telephone Encounter (Signed)
Pt's wife, Deloris Collins request refill for dabigatran (PRADAXA) 150 MG CAPS capsule at [facility]

## 2022-08-08 ENCOUNTER — Other Ambulatory Visit (HOSPITAL_COMMUNITY): Payer: Self-pay

## 2022-08-08 NOTE — Telephone Encounter (Signed)
PA for Pradaxa has been sent via cmm and instant approval has been received.  (Key: [id number])  This request has been approved.  Please note any additional information provided by [facility] at the bottom of your screen.

## 2022-08-08 NOTE — Telephone Encounter (Signed)
Pa is needed for this med we are working the request.

## 2022-08-08 NOTE — Telephone Encounter (Signed)
Pt's wife called stating that the pharmacy has not received the pt's dabigatran (PRADAXA) 150 MG CAPS capsule refill request. She would like to know if it can be sent again.

## 2022-08-09 ENCOUNTER — Other Ambulatory Visit (HOSPITAL_COMMUNITY): Payer: Self-pay

## 2022-08-25 ENCOUNTER — Other Ambulatory Visit (HOSPITAL_COMMUNITY): Payer: Self-pay

## 2022-09-04 ENCOUNTER — Other Ambulatory Visit (HOSPITAL_COMMUNITY): Payer: Self-pay

## 2022-09-05 ENCOUNTER — Other Ambulatory Visit (HOSPITAL_COMMUNITY): Payer: Self-pay

## 2022-09-05 ENCOUNTER — Other Ambulatory Visit: Payer: Self-pay | Admitting: Neurology

## 2022-09-05 MED ORDER — DABIGATRAN ETEXILATE MESYLATE 150 MG PO CAPS
150.0000 mg | ORAL_CAPSULE | Freq: Two times a day (BID) | ORAL | 1 refills | Status: DC
  Filled 2022-09-05: qty 180, 90d supply, fill #0
  Filled 2022-09-06: qty 120, 60d supply, fill #0
  Filled 2022-09-07: qty 60, 30d supply, fill #0
  Filled 2022-11-28: qty 180, 90d supply, fill #1

## 2022-09-05 NOTE — Progress Notes (Signed)
Pharmacy requested a 90 day supply of Pradaxa, I sent it in.   Meds ordered this encounter  Medications   dabigatran (PRADAXA) 150 MG CAPS capsule    Sig: Take 1 capsule (150 mg total) by mouth 2 (two) times daily.    Dispense:  180 capsule    Refill:  1

## 2022-09-06 ENCOUNTER — Other Ambulatory Visit (HOSPITAL_COMMUNITY): Payer: Self-pay

## 2022-09-07 ENCOUNTER — Other Ambulatory Visit (HOSPITAL_COMMUNITY): Payer: Self-pay

## 2022-09-19 ENCOUNTER — Ambulatory Visit: Payer: 59 | Admitting: Neurology

## 2022-09-27 ENCOUNTER — Other Ambulatory Visit (HOSPITAL_COMMUNITY): Payer: Self-pay

## 2022-10-04 ENCOUNTER — Other Ambulatory Visit (HOSPITAL_COMMUNITY): Payer: Self-pay

## 2022-10-10 ENCOUNTER — Ambulatory Visit: Payer: Commercial Managed Care - HMO | Admitting: Neurology

## 2022-10-10 ENCOUNTER — Other Ambulatory Visit (HOSPITAL_COMMUNITY): Payer: Self-pay

## 2022-10-10 ENCOUNTER — Encounter: Payer: Self-pay | Admitting: Neurology

## 2022-10-10 VITALS — BP 125/85 | HR 82 | Ht 69.6 in | Wt 175.8 lb

## 2022-10-10 DIAGNOSIS — G40909 Epilepsy, unspecified, not intractable, without status epilepticus: Secondary | ICD-10-CM

## 2022-10-10 DIAGNOSIS — Z8673 Personal history of transient ischemic attack (TIA), and cerebral infarction without residual deficits: Secondary | ICD-10-CM

## 2022-10-10 DIAGNOSIS — G8110 Spastic hemiplegia affecting unspecified side: Secondary | ICD-10-CM | POA: Diagnosis not present

## 2022-10-10 MED ORDER — DABIGATRAN ETEXILATE MESYLATE 150 MG PO CAPS
150.0000 mg | ORAL_CAPSULE | Freq: Two times a day (BID) | ORAL | 3 refills | Status: DC
  Filled 2022-10-10 – 2022-12-26 (×2): qty 60, 30d supply, fill #0

## 2022-10-10 NOTE — Progress Notes (Signed)
Patient: [place] Date of Birth: 1956/07/22  Reason for Visit: stroke follow-up  History from: Patient, brother in law, [place], wife  Primary Neurologist: Saw Dr. Erlinda [place] in [place]    HISTORY OF PRESENT ILLNESS: Update 10/10/2022 : He returns for follow-up after last visit 3 months ago.  [name].  He has transitioned from Eliquis to Pradaxa and seems to be tolerating it well without bleeding or bruising.  [employer] his insurance company has suggested changing to generic Pradaxa when it becomes available in January.  Patient was also tolerated switching from Keppra to Lamictal 100 twice daily which she is now tolerating well without side effects.  Family feels that his moodiness is a lot improved since stopping Keppra.  His blood pressure is under good control today is 125/85.  His sugars are also well controlled and last A1c was 6.6.  Patient had MRI scan of the brain on 06/23/2022 which showed expected evolutionary changes in the left anterior frontal MCA branch infarct as well as old remote age large MCA and posterior left MCA branch.  EEG on 07/20/2022 was normal.  Prior visit 06/15/2022 [place], NP Here today for earlier visit. 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 They don't understand his limitations. The previous owners sold. [facility] [place] had issues before the stroke. Since his stroke, the car wash implemented a computer system, he can't operate. Since stroke, changes in his behavior, he is more forgetful. Needs help managing medication, double dosed 1 time, gets agitated/irritating with any questioning. His left side has always been his weak side, question if feeling weaker, could be because not about work/sitting around more, hasn't worked since end of April. His work claimed he has slowed down. As a child, he had prolonged fever illness that left him with  left sided deficits, brain injury. He didn't want to complete [place] testing today.  Tearful about not being able to contribute financially.  He remains on Keppra, Pradaxa.  Update 03/14/22 SS; [facility] [place] here today for follow-up, 02/09/22 woke from sleep because of leg cramp, had witnessed seizure was full body shaking, snoring respirations. When EMS arrived, left-sided facial droop and weakness.  CT [place] showed small SAH, likely etiology of seizure, given IV Keppra.  Left-sided weakness chronic since childhood, but left-sided facial droop was new.  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  Here today with brother in law, [place]. Still has chronic left sided weakness, facial droop resolved. Is back to baseline. Lives with wife, he never had drivers license. Remains on Keppra 500 mg twice daily. Tolerating well. No seizures. 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 Not taking BP medication currently. Has seen PCP. Denies headaches. Doing well, back at baseline.   -MRI showed small acute left frontal cortical/subcortical infarct and bilateral punctate cerebellar acute infarcts.  Bilateral MCA old infarcts.  -CTA [place] and neck unremarkable.   -Repeat CT [place] showed stable appearance of right MCA infarct and encephalomalacia.  Focal hyperdensity along the superomedial aspect of the encephalomalacia likely represents calcification related to prior infarcts, there is no definite blood products seen -Routine EEG was normal -2D echo EF 60 to 65% -LDL 71 -A1c 5.7  HISTORY  Copied Dr. Erlinda [place] note 02/11/22: Mr. [place] is a 66 y.o. 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 He presented with left facial droop and left sided weakness especially in left  hand. Wife noted generalized shaking and snoring like breathing, likely a seizure. He takes Eliquis for history of blood clot and wife says they make sure he is compliant. Patient was found to have Sedan and likely first time seizure, loaded with levetiracetam 1,'000mg'$  then continued on maintenance 500 mg BID. Routine EEG wnl. Left sided weakness is chronic since childhood, confirmed by sister, but L facial droop is new.   REVIEW OF SYSTEMS: Out of a complete 14 system review of symptoms, the patient complains only of the following symptoms, and all other reviewed systems are negative.  See HPI  ALLERGIES: No Known Allergies  HOME MEDICATIONS: Outpatient Medications Prior to Visit  Medication Sig Dispense Refill   acetaminophen (TYLENOL) 500 MG tablet Take 1,000 mg by mouth every 6 (six) hours as needed for headache (pain).     amLODipine (NORVASC) 5 MG tablet Take 1 tablet by mouth once daily 90 tablet 3   dabigatran (PRADAXA) 150 MG CAPS capsule Take 1 capsule (150 mg total) by mouth 2 (two) times daily. 180 capsule 1   FERROUS SULFATE PO Take 1 tablet by mouth daily. Unsure of strength     lamoTRIgine (LAMICTAL) 100 MG tablet Take 1 tablet (100 mg total) by mouth 2 (two) times daily. 180 tablet 3   metFORMIN (GLUCOPHAGE) 500 MG tablet Take 1 tablet (500 mg total) by mouth 2 (two) times daily with a meal. 180 tablet 2   Multiple Vitamins-Minerals (MULTIVITAMIN GUMMIES ADULT PO) Take 2 tablets by mouth daily. Take 2 gummies daily     simvastatin (ZOCOR) 20 MG tablet Take 1 tablet (20 mg total) by mouth daily. 90 tablet 2   No facility-administered medications prior to visit.    PAST MEDICAL HISTORY: Past Medical History:  Diagnosis Date   Basal cell carcinoma of skin 03/14/2022   Diabetes mellitus without complication ([place])    Hypertension    Renal insufficiency 06/01/2020    PAST SURGICAL HISTORY: Past Surgical History:  Procedure Laterality Date   ABDOMINAL SURGERY     IR US GUIDE  BX ASP/DRAIN  06/01/2020    FAMILY HISTORY: History reviewed. No pertinent family history.  SOCIAL HISTORY: Social History   Socioeconomic History   Marital status: Single    Spouse name: Not on file   Number of children: Not on file   Years of education: Not on file   Highest education level: Not on file  Occupational History   Not on file  Tobacco Use   Smoking status: Never   Smokeless tobacco: Never  Substance and Sexual Activity   Alcohol use: No   Drug use: No   Sexual activity: Not on file  Other Topics Concern   Not on file  Social History Narrative   Not on file   Social Determinants of Health   Financial Resource Strain: Not on file  Food Insecurity: Not on file  Transportation Needs: Not on file  Physical Activity: Not on file  Stress: Not on file  Social Connections: Not on file  Intimate Partner Violence: Not on file    PHYSICAL EXAM  Vitals:   10/10/22 1509  BP: 125/85  Pulse: 82  Weight: 175 lb 12.8 oz (79.7 kg)  Height: 5' 9.6" (1.768 m)    Body mass index is 25.52 kg/m.  Generalized: Well developed, in no acute distress  Neurological examination  Mentation: Alert oriented to time, place, history taking. Follows all commands, speech is slightly dysarthric, but is understandable. Patient is quiet/somewhat withdrawn, history is obtained from family. Cranial nerve II-XII: Mild asymmetry to left eye compared to right. Pupils were equal round reactive to light. Extraocular movements were full, visual field were full on confrontational test. Facial sensation and strength were normal. [place] turning and shoulder shrug  were normal and symmetric. Smile appears very slight droop on left. Motor: Mild weakness to the left arm and leg 4.5/5, atrophy of left arm noted, flexion of fingers.  Spasticity on the left side. Sensory: Decreased sensation to left face, arm, leg Coordination: Cerebellar testing reveals good finger-nose-finger and heel-to-shin  bilaterally.  Gait and station: Gait is slightly wide-based, spastic hemiplegic gait with slight left foot drop Reflexes: Deep tendon reflexes are symmetric but increased on the left  DIAGNOSTIC DATA (LABS, IMAGING, TESTING) - I reviewed patient records, labs, notes, testing and imaging myself where available.  Lab Results  Component Value Date   WBC 7.9 02/10/2022   HGB 13.9 02/10/2022   HCT 41.0 02/10/2022   MCV 95.7 02/10/2022   PLT 200 02/10/2022      Component Value Date/Time   NA 139 02/11/2022 0430   K 3.3 (L) 02/11/2022 0430   CL 105 02/11/2022 0430   CO2 24 02/11/2022 0430   GLUCOSE 109 (H) 02/11/2022 0430   BUN 11 02/11/2022 0430   CREATININE 1.03 02/11/2022 0430   CALCIUM 8.3 (L) 02/11/2022 0430   PROT 6.4 (L) 02/10/2022 0139   ALBUMIN 3.6 02/10/2022 0139   AST 24 02/10/2022 0139   ALT 15 02/10/2022 0139   ALKPHOS 78 02/10/2022 0139   BILITOT 0.6 02/10/2022 0139   GFRNONAA >60 02/11/2022 0430   GFRAA >60 06/04/2020 0422   Lab Results  Component Value Date   CHOL 141 02/11/2022   HDL 51 02/11/2022   LDLCALC 71 02/11/2022   TRIG 97 02/11/2022   CHOLHDL 2.8 02/11/2022   Lab Results  Component Value Date   HGBA1C 5.7 (H) 02/10/2022   No results found for: "VITAMINB12" No results found for: "TSH"  ASSESSMENT : 66 year old Caucasian male with 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  History of seizure disorder well-controlled on Lamictal 100 twice daily.  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  On long-term anticoagulation due to history of DVT and PE  PLAN ; I had a long discussion with the patient and his brother-in-law regarding his recent stroke as well as seizure disorder and history of DVT and pulmonary embolism.  I recommend he continue Pradaxa 150 mg twice daily he needs to switch to generic dabigatran after December when it becomes available as  recommended by [facility].  Continue with Lamictal and the current dose of 100 mg twice daily for seizures collectors he seems to be tolerating it well without side effects or breakthrough seizures.  He will return for follow-up in the future in 6 months with [facility] [place] nurse practitioner call earlier if necessary.  Greater than 50% time during this 35-minute visit was spent on counseling and coordination of care about his strokes and seizures and answering questions   Antony Contras, MD  10/10/2022, 3:52 PM [facility] [address], [place], [place] [address] [phone]

## 2022-10-10 NOTE — Patient Instructions (Signed)
I had a long discussion with the patient and his brother-in-law regarding his recent stroke as well as seizure disorder and history of DVT and pulmonary embolism.  I recommend he continue Pradaxa 150 mg twice daily he needs to switch to generic dabigatran after December when it becomes available as recommended by [facility].  Continue with Lamictal and the current dose of 100 mg twice daily for seizures collectors he seems to be tolerating it well without side effects or breakthrough seizures.  He will return for follow-up in the future in 6 months with [name] nurse practitioner call earlier if necessary.

## 2022-11-10 ENCOUNTER — Other Ambulatory Visit (HOSPITAL_COMMUNITY): Payer: Self-pay

## 2022-11-10 MED ORDER — SIMVASTATIN 20 MG PO TABS
20.0000 mg | ORAL_TABLET | Freq: Every day | ORAL | 2 refills | Status: AC
  Filled 2022-11-10 – 2022-12-26 (×2): qty 90, 90d supply, fill #0
  Filled 2023-03-22: qty 90, 90d supply, fill #1

## 2022-11-17 ENCOUNTER — Other Ambulatory Visit (HOSPITAL_COMMUNITY): Payer: Self-pay

## 2022-11-28 ENCOUNTER — Ambulatory Visit: Payer: 59 | Admitting: Neurology

## 2022-11-28 ENCOUNTER — Other Ambulatory Visit (HOSPITAL_COMMUNITY): Payer: Self-pay

## 2022-11-29 ENCOUNTER — Other Ambulatory Visit (HOSPITAL_COMMUNITY): Payer: Self-pay

## 2022-11-30 ENCOUNTER — Other Ambulatory Visit (HOSPITAL_COMMUNITY): Payer: Self-pay

## 2022-12-05 ENCOUNTER — Telehealth: Payer: Self-pay | Admitting: Neurology

## 2022-12-05 NOTE — Telephone Encounter (Signed)
Pt wife is calling. Stated pt insurance will not be covering medication dabigatran (PRADAXA) 150 MG CAPS capsule. She is requesting provider change the medication to something that insurance will cover.

## 2022-12-06 ENCOUNTER — Other Ambulatory Visit (HOSPITAL_COMMUNITY): Payer: Self-pay

## 2022-12-06 NOTE — Addendum Note (Signed)
Addended by: Verlin Grills on: 12/06/2022 02:44 PM   Modules accepted: Orders

## 2022-12-07 ENCOUNTER — Other Ambulatory Visit: Payer: Self-pay

## 2022-12-08 ENCOUNTER — Other Ambulatory Visit (HOSPITAL_COMMUNITY): Payer: Self-pay

## 2022-12-08 ENCOUNTER — Telehealth: Payer: Self-pay | Admitting: Neurology

## 2022-12-08 MED ORDER — APIXABAN 5 MG PO TABS
5.0000 mg | ORAL_TABLET | Freq: Two times a day (BID) | ORAL | 2 refills | Status: DC
  Filled 2022-12-08 – 2022-12-26 (×2): qty 60, 30d supply, fill #0

## 2022-12-08 NOTE — Telephone Encounter (Signed)
Pt's wife [place] requesting Eliquis 5 mg sent to [facility]. If have questions can call [phone]

## 2022-12-08 NOTE — Addendum Note (Signed)
Addended by: Marcial Pacas on: 12/08/2022 02:06 PM   Modules accepted: Orders

## 2022-12-08 NOTE — Telephone Encounter (Signed)
Meds ordered this encounter  Medications   apixaban (ELIQUIS) 5 MG TABS tablet    Sig: Take 1 tablet (5 mg total) by mouth 2 (two) times daily.    Dispense:  60 tablet    Refill:  2

## 2022-12-08 NOTE — Telephone Encounter (Signed)
Patient called on-call physician for Eliquis prescription, sent to pharmacy

## 2022-12-12 ENCOUNTER — Other Ambulatory Visit (HOSPITAL_COMMUNITY): Payer: Self-pay

## 2022-12-12 ENCOUNTER — Telehealth: Payer: Self-pay

## 2022-12-12 NOTE — Telephone Encounter (Signed)
A PA for Eliquis 5 mg tablet has been started on [facility]. Key #: [id number]. This request has been approved.

## 2022-12-26 ENCOUNTER — Other Ambulatory Visit: Payer: Self-pay

## 2022-12-26 ENCOUNTER — Other Ambulatory Visit (HOSPITAL_COMMUNITY): Payer: Self-pay

## 2022-12-27 ENCOUNTER — Other Ambulatory Visit (HOSPITAL_COMMUNITY): Payer: Self-pay

## 2022-12-27 MED ORDER — METFORMIN HCL 500 MG PO TABS
500.0000 mg | ORAL_TABLET | Freq: Two times a day (BID) | ORAL | 2 refills | Status: DC
  Filled 2022-12-27: qty 180, 90d supply, fill #0
  Filled 2023-03-22: qty 180, 90d supply, fill #1

## 2022-12-29 ENCOUNTER — Other Ambulatory Visit (HOSPITAL_COMMUNITY): Payer: Self-pay

## 2023-01-01 ENCOUNTER — Telehealth: Payer: Self-pay | Admitting: Neurology

## 2023-01-01 NOTE — Telephone Encounter (Signed)
Wife states insurance has informed they will no longer cover PRADAXA, wife states the apixaban (ELIQUIS) 5 MG TABS tablet , affected pt's mood. She would like a call to discuss pt being put on another medication.

## 2023-01-02 ENCOUNTER — Other Ambulatory Visit (HOSPITAL_COMMUNITY): Payer: Self-pay

## 2023-01-08 ENCOUNTER — Other Ambulatory Visit (HOSPITAL_COMMUNITY): Payer: Self-pay

## 2023-01-08 MED ORDER — APIXABAN 5 MG PO TABS
5.0000 mg | ORAL_TABLET | Freq: Two times a day (BID) | ORAL | 5 refills | Status: DC
  Filled 2023-01-08 – 2023-02-24 (×2): qty 60, 30d supply, fill #0
  Filled 2023-03-22: qty 60, 30d supply, fill #1

## 2023-01-08 NOTE — Addendum Note (Signed)
Addended by: Suzzanne Cloud on: 01/08/2023 06:46 AM   Modules accepted: Orders

## 2023-02-24 ENCOUNTER — Other Ambulatory Visit (HOSPITAL_COMMUNITY): Payer: Self-pay

## 2023-02-26 ENCOUNTER — Other Ambulatory Visit: Payer: Self-pay

## 2023-02-26 ENCOUNTER — Other Ambulatory Visit (HOSPITAL_COMMUNITY): Payer: Self-pay

## 2023-02-27 ENCOUNTER — Other Ambulatory Visit (HOSPITAL_COMMUNITY): Payer: Self-pay

## 2023-03-01 ENCOUNTER — Other Ambulatory Visit (HOSPITAL_COMMUNITY): Payer: Self-pay

## 2023-03-01 MED ORDER — AMLODIPINE BESYLATE 5 MG PO TABS
5.0000 mg | ORAL_TABLET | Freq: Every day | ORAL | 0 refills | Status: DC
  Filled 2023-03-01: qty 90, 90d supply, fill #0

## 2023-03-02 ENCOUNTER — Other Ambulatory Visit (HOSPITAL_COMMUNITY): Payer: Self-pay

## 2023-03-22 ENCOUNTER — Other Ambulatory Visit (HOSPITAL_COMMUNITY): Payer: Self-pay

## 2023-04-02 ENCOUNTER — Other Ambulatory Visit (HOSPITAL_COMMUNITY): Payer: Self-pay

## 2023-04-02 MED ORDER — METFORMIN HCL 1000 MG PO TABS
1000.0000 mg | ORAL_TABLET | Freq: Two times a day (BID) | ORAL | 3 refills | Status: AC
  Filled 2023-04-02: qty 180, 90d supply, fill #0

## 2023-04-10 ENCOUNTER — Ambulatory Visit (INDEPENDENT_AMBULATORY_CARE_PROVIDER_SITE_OTHER): Payer: Commercial Managed Care - HMO | Admitting: Neurology

## 2023-04-10 ENCOUNTER — Encounter: Payer: Self-pay | Admitting: Neurology

## 2023-04-10 VITALS — BP 150/96 | HR 91 | Ht 69.0 in | Wt 175.0 lb

## 2023-04-10 DIAGNOSIS — I639 Cerebral infarction, unspecified: Secondary | ICD-10-CM | POA: Diagnosis not present

## 2023-04-10 DIAGNOSIS — R569 Unspecified convulsions: Secondary | ICD-10-CM | POA: Diagnosis not present

## 2023-04-10 MED ORDER — LAMOTRIGINE 100 MG PO TABS: 100.0000 mg | ORAL_TABLET | Freq: Two times a day (BID) | ORAL | 3 refills | Status: DC

## 2023-04-10 MED ORDER — APIXABAN 5 MG PO TABS: 5.0000 mg | ORAL_TABLET | Freq: Two times a day (BID) | ORAL | 5 refills | Status: DC

## 2023-04-10 NOTE — Patient Instructions (Signed)
Continue current medications  Strict management of vascular risk factors with a goal BP less than 130/90, A1c less than 7.0, LDL less than 70 for secondary stroke prevention Follow up in 6 months with Dr. Pearlean Brownie

## 2023-04-10 NOTE — Progress Notes (Signed)
Patient: Fernando Rhodes Date of Birth: 02/27/1956  Reason for Visit: stroke follow-up  History from: Patient, brother in law, Kathlene Rhodes Primary Neurologist: Dr.Sethi   ASSESSMENT AND PLAN 67 y.o. year old male   1. Stroke, multifocal infarcts March 2023 due to Eliquis failure 2. Seizure, single seizure 02/10/22, at high risk for recurrent seizure with history of stroke, TBI 3.  History of prolonged febrile illness as a child with subsequent traumatic brain injury, chronic left-sided weakness, intellectual disability 4.  History of DVT, PE  -Continue Eliquis 5 mg twice daily for secondary stroke prevention (started on Pradaxa in March post CVA, insurance would no longer pay for Pradaxa in December 2023 he was switched to Eliquis 5 mg twice a day), his wife feels potentially some mood related side effects from Eliquis?  Unclear if this is related?  Nonetheless, apparently his mood was better on Pradaxa, he will be switching to Medicare in September, we could try again for coverage at that time. Dr.Sethi discussed enrollment in research trials to help with cost of medication, he declined -Continue Lamictal 100 mg twice daily for seizure prevention -Strict management of vascular risk factors with a goal BP less than 130/90, A1c less than 7.0, LDL less than 70 for secondary stroke prevention -Follow-up in 6 months or sooner if needed  Meds ordered this encounter  Medications   apixaban (ELIQUIS) 5 MG TABS tablet    Sig: Take 1 tablet (5 mg total) by mouth 2 (two) times daily.    Dispense:  60 tablet    Refill:  5   lamoTRIgine (LAMICTAL) 100 MG tablet    Sig: Take 1 tablet (100 mg total) by mouth 2 (two) times daily.    Dispense:  180 tablet    Refill:  3   HISTORY OF PRESENT ILLNESS: Today 04/10/23 He is on Eliquis 5 mg twice daily. He was on Pradaxa but his insurance won't pay for it. On Lamictal 100 mg BID, no seizures reported. Some question of if Eliquis is causing mood swings in  his wife's opinion. He is trying to get disability. He has never driven. He feels he is back to normal. He mows the grass, cleans the house. He will be 67 in September, hoping Pradaxa will be covered. His wife felt his memory was better on Pradaxa. He doesn't understand things the way he used to. He gets frustrated with himself.   Update 10/10/2022 Dr. Pearlean Brownie : He returns for follow-up after last visit 3 months ago.  [name].  He has transitioned from Eliquis to Pradaxa and seems to be tolerating it well without bleeding or bruising.  [employer] his insurance company has suggested changing to generic Pradaxa when it becomes available in January.  Patient was also tolerated switching from Keppra to Lamictal 100 twice daily which she is now tolerating well without side effects.  Family feels that his moodiness is a lot improved since stopping Keppra.  His blood pressure is under good control today is 125/85.  His sugars are also well controlled and last A1c was 6.6.  Patient had MRI scan of the brain on 06/23/2022 which showed expected evolutionary changes in the left anterior frontal MCA branch infarct as well as old remote age large MCA and posterior left MCA branch.  EEG on 07/20/2022 was normal.   Update 06/15/22 SS: Here today for earlier visit. 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 They don't understand his limitations. The previous owners sold. Fernando Rhodes had issues before the stroke. Since his stroke, the car wash implemented a computer system, he can't operate. Since stroke, changes in his behavior, he is more forgetful. Needs help managing medication, double dosed 1 time, gets agitated/irritating with any questioning. His left side has always been his weak side, question if feeling weaker, could be because not about work/sitting around more, hasn't worked since end of April. His  work claimed he has slowed down. As a child, he had prolonged fever illness that left him with left sided deficits, brain injury. He didn't want to complete MOCA testing today.  Tearful about not being able to contribute financially.  He remains on Keppra, Pradaxa.  Update 03/14/22 SS; Sudie Bailey here today for follow-up, 02/09/22 woke from sleep because of leg cramp, had witnessed seizure was full body shaking, snoring respirations. When EMS arrived, left-sided facial droop and weakness.  CT head showed small SAH, likely etiology of seizure, given IV Keppra.  Left-sided weakness chronic since childhood, but left-sided facial droop was new.  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  Here today with brother in law, Kathlene Rhodes. Still has chronic left sided weakness, facial droop resolved. Is back to baseline. Lives with wife, he never had drivers license. Remains on Keppra 500 mg twice daily. Tolerating well. No seizures. 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 Not taking BP medication currently. Has seen PCP. Denies headaches. Doing well, back at baseline.   -MRI showed small acute left frontal cortical/subcortical infarct and bilateral punctate cerebellar acute infarcts.  Bilateral MCA old infarcts.  -CTA head and neck unremarkable.   -Repeat CT head showed stable appearance of right MCA infarct and encephalomalacia.  Focal hyperdensity along the superomedial aspect of the encephalomalacia likely represents calcification related to prior infarcts, there is no definite blood products seen -Routine EEG was normal -2D echo EF 60 to 65% -LDL 71 -A1c 5.7  HISTORY  Copied Dr. Roda Shutters note 02/11/22: Fernando Rhodes is a 67 y.o. 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 He presented with left facial droop and left sided weakness especially in left hand. Wife noted generalized shaking and snoring like breathing, likely a seizure. He takes Eliquis for history of blood clot and wife says they make sure he is compliant. Patient was found to have SAH and likely first time seizure, loaded with levetiracetam 1,000mg  then continued on maintenance 500 mg BID. Routine EEG wnl. Left sided weakness is chronic since childhood, confirmed by sister, but L facial droop is new.   REVIEW OF SYSTEMS: Out of a complete 14 system review of symptoms, the patient complains only of the following symptoms, and all other reviewed systems are negative.  See HPI  ALLERGIES: No Known Allergies  HOME MEDICATIONS: Outpatient Medications Prior to Visit  Medication Sig Dispense Refill   acetaminophen (TYLENOL) 500 MG tablet Take 1,000 mg by mouth every 6 (six) hours as needed for headache (pain).     amLODipine (NORVASC) 5 MG tablet Take 1 tablet by mouth once daily 90 tablet 0   FERROUS SULFATE  PO Take 1 tablet by mouth daily. Unsure of strength     metFORMIN (GLUCOPHAGE) 1000 MG tablet Take 1 tablet (1,000 mg total) by mouth 2 (two) times daily with a meal. 180 tablet 3   Multiple Vitamins-Minerals (MULTIVITAMIN GUMMIES ADULT PO) Take 2 tablets by mouth daily. Take 2 gummies daily     simvastatin (ZOCOR) 20 MG tablet Take 1 tablet (20 mg total) by mouth daily. 90 tablet 2   apixaban (ELIQUIS) 5 MG TABS tablet Take 1 tablet (5 mg total) by mouth 2 (two) times daily. 60 tablet 5   dabigatran (PRADAXA) 150 MG CAPS capsule Take 1 capsule (150 mg total) by mouth 2 (two) times daily. 180 capsule 1   dabigatran (PRADAXA) 150 MG CAPS capsule Take 1 capsule (150 mg total) by mouth 2 (two) times daily. 60 capsule 3   lamoTRIgine (LAMICTAL) 100 MG tablet Take 1 tablet (100 mg total) by mouth 2 (two) times daily. 180 tablet 3   metFORMIN (GLUCOPHAGE) 500 MG tablet Take 1 tablet (500 mg  total) by mouth 2 (two) times daily with a meal. 180 tablet 2   No facility-administered medications prior to visit.    PAST MEDICAL HISTORY: Past Medical History:  Diagnosis Date   Basal cell carcinoma of skin 03/14/2022   Diabetes mellitus without complication (HCC)    Hypertension    Renal insufficiency 06/01/2020    PAST SURGICAL HISTORY: Past Surgical History:  Procedure Laterality Date   ABDOMINAL SURGERY     IR US GUIDE BX ASP/DRAIN  06/01/2020    FAMILY HISTORY: History reviewed. No pertinent family history.  SOCIAL HISTORY: Social History   Socioeconomic History   Marital status: Single    Spouse name: Not on file   Number of children: Not on file   Years of education: Not on file   Highest education level: Not on file  Occupational History   Not on file  Tobacco Use   Smoking status: Never   Smokeless tobacco: Never  Substance and Sexual Activity   Alcohol use: No   Drug use: No   Sexual activity: Not on file  Other Topics Concern   Not on file  Social History Narrative   Not on file   Social Determinants of Health   Financial Resource Strain: Not on file  Food Insecurity: Not on file  Transportation Needs: Not on file  Physical Activity: Not on file  Stress: Not on file  Social Connections: Not on file  Intimate Partner Violence: Not on file   PHYSICAL EXAM  Vitals:   04/10/23 1256  BP: (!) 150/96  Pulse: 91  Weight: 175 lb (79.4 kg)  Height: 5\' 9"  (1.753 m)   Body mass index is 25.84 kg/m.  Generalized: Well developed, in no acute distress  Neurological examination  Mentation: Alert oriented to time, place, history taking, most history is provided by Kathlene Rhodes. Follows all commands, speech is slightly dysarthric, but is understandable.  Seems happy, upbeat. Cranial nerve II-XII: Mild asymmetry to left eye compared to right. Pupils were equal round reactive to light. Extraocular movements were full, visual field were full on confrontational  test. Facial sensation and strength were normal. Head turning and shoulder shrug  were normal and symmetric. Smile appears very slight droop on left. Motor: Mild weakness to the left arm and leg 4.5/5, atrophy of left arm noted, flexion of fingers Sensory: Decreased sensation to left face, arm, leg Coordination: Cerebellar testing reveals good finger-nose-finger and heel-to-shin bilaterally.  Gait and station: Gait is slightly wide-based, slightly more limp on the left that noted before, but is independent Reflexes: Deep tendon reflexes are symmetric but increased on the left  DIAGNOSTIC DATA (LABS, IMAGING, TESTING) - I reviewed patient records, labs, notes, testing and imaging myself where available.  Lab Results  Component Value Date   WBC 7.9 02/10/2022   HGB 13.9 02/10/2022   HCT 41.0 02/10/2022   MCV 95.7 02/10/2022   PLT 200 02/10/2022      Component Value Date/Time   NA 139 02/11/2022 0430   K 3.3 (L) 02/11/2022 0430   CL 105 02/11/2022 0430   CO2 24 02/11/2022 0430   GLUCOSE 109 (H) 02/11/2022 0430   BUN 11 02/11/2022 0430   CREATININE 1.03 02/11/2022 0430   CALCIUM 8.3 (L) 02/11/2022 0430   PROT 6.4 (L) 02/10/2022 0139   ALBUMIN 3.6 02/10/2022 0139   AST 24 02/10/2022 0139   ALT 15 02/10/2022 0139   ALKPHOS 78 02/10/2022 0139   BILITOT 0.6 02/10/2022 0139   GFRNONAA >60 02/11/2022 0430   GFRAA >60 06/04/2020 0422   Lab Results  Component Value Date   CHOL 141 02/11/2022   HDL 51 02/11/2022   LDLCALC 71 02/11/2022   TRIG 97 02/11/2022   CHOLHDL 2.8 02/11/2022   Lab Results  Component Value Date   HGBA1C 5.7 (H) 02/10/2022   No results found for: "VITAMINB12" No results found for: "TSH"  Margie Ege, Edrick Oh, DNP 04/10/2023, 3:45 PM [facility] [address], [address] [place], [place] [address] [phone]

## 2023-04-11 NOTE — Progress Notes (Signed)
I agree with the above plan 

## 2023-04-12 ENCOUNTER — Encounter: Payer: Self-pay | Admitting: Neurology

## 2023-04-15 DIAGNOSIS — R2231 Localized swelling, mass and lump, right upper limb: Secondary | ICD-10-CM | POA: Insufficient documentation

## 2023-04-18 ENCOUNTER — Other Ambulatory Visit (HOSPITAL_COMMUNITY): Payer: Self-pay

## 2023-04-18 MED ORDER — AMLODIPINE BESYLATE 10 MG PO TABS
10.0000 mg | ORAL_TABLET | Freq: Every day | ORAL | 3 refills | Status: AC
  Filled 2023-04-18: qty 90, 90d supply, fill #0

## 2023-04-19 ENCOUNTER — Other Ambulatory Visit (HOSPITAL_COMMUNITY): Payer: Self-pay

## 2023-05-24 ENCOUNTER — Other Ambulatory Visit (HOSPITAL_COMMUNITY): Payer: Self-pay

## 2023-09-27 DIAGNOSIS — Z0000 Encounter for general adult medical examination without abnormal findings: Secondary | ICD-10-CM | POA: Diagnosis not present

## 2023-09-27 DIAGNOSIS — E785 Hyperlipidemia, unspecified: Secondary | ICD-10-CM | POA: Diagnosis not present

## 2023-09-27 DIAGNOSIS — E1122 Type 2 diabetes mellitus with diabetic chronic kidney disease: Secondary | ICD-10-CM | POA: Diagnosis not present

## 2023-09-27 DIAGNOSIS — E11620 Type 2 diabetes mellitus with diabetic dermatitis: Secondary | ICD-10-CM | POA: Diagnosis not present

## 2023-09-27 DIAGNOSIS — E1169 Type 2 diabetes mellitus with other specified complication: Secondary | ICD-10-CM | POA: Diagnosis not present

## 2023-09-27 DIAGNOSIS — I10 Essential (primary) hypertension: Secondary | ICD-10-CM | POA: Diagnosis not present

## 2023-09-27 DIAGNOSIS — Z86711 Personal history of pulmonary embolism: Secondary | ICD-10-CM | POA: Diagnosis not present

## 2023-09-27 DIAGNOSIS — I700 Atherosclerosis of aorta: Secondary | ICD-10-CM | POA: Diagnosis not present

## 2023-09-27 DIAGNOSIS — N1831 Chronic kidney disease, stage 3a: Secondary | ICD-10-CM | POA: Diagnosis not present

## 2023-09-27 DIAGNOSIS — L309 Dermatitis, unspecified: Secondary | ICD-10-CM | POA: Diagnosis not present

## 2023-10-23 ENCOUNTER — Encounter: Payer: Self-pay | Admitting: Neurology

## 2023-10-23 ENCOUNTER — Ambulatory Visit: Payer: Medicare Other | Admitting: Neurology

## 2023-10-23 VITALS — BP 125/85 | HR 87 | Ht 69.0 in | Wt 175.0 lb

## 2023-10-23 DIAGNOSIS — Z8673 Personal history of transient ischemic attack (TIA), and cerebral infarction without residual deficits: Secondary | ICD-10-CM | POA: Diagnosis not present

## 2023-10-23 DIAGNOSIS — Z79899 Other long term (current) drug therapy: Secondary | ICD-10-CM | POA: Diagnosis not present

## 2023-10-23 DIAGNOSIS — I480 Paroxysmal atrial fibrillation: Secondary | ICD-10-CM

## 2023-10-23 DIAGNOSIS — R799 Abnormal finding of blood chemistry, unspecified: Secondary | ICD-10-CM | POA: Diagnosis not present

## 2023-10-23 NOTE — Progress Notes (Signed)
Patient: Fernando Rhodes Date of Birth: Jul 20, 1956  Reason for Visit: stroke follow-up  History from: Patient, brother in law, Fernando Rhodes Primary Neurologist: Dr.Sher Hellinger      HISTORY OF PRESENT ILLNESS: Update 10/23/23 he returns for follow-up after last visit with nurse practitioner 6 months ago.  [name].  Patient states he is doing quite well.  He has no respiratory deficit from his stroke.  [name].  Remains on Eliquis which is tolerating well with minor bruising and no bleeding.  He remains on Lamictal 100 mg twice daily which is tolerating well without side effects.  He has had no recurrent seizures or seizure-like episodes.  States his blood pressure is in the very good control and his sugars are doing well as well.  He is tolerating Zocor well without muscle aches and pains.  He has not had any lipid panel in more than a year. He has no new complaints.  Prior visit 04/10/23 Margie Ege, NP )He is on Eliquis 5 mg twice daily. He was on Pradaxa but his insurance won't pay for it. On Lamictal 100 mg BID, no seizures reported. Some question of if Eliquis is causing mood swings in his wife's opinion. He is trying to get disability. He has never driven. He feels he is back to normal. He mows the grass, cleans the house. He will be 67 in September, hoping Pradaxa will be covered. His wife felt his memory was better on Pradaxa. He doesn't understand things the way he used to. He gets frustrated with himself.   Update 10/10/2022 Dr. Pearlean Brownie : He returns for follow-up after last visit 3 months ago.  [name].  He has transitioned from Eliquis to Pradaxa and seems to be tolerating it well without bleeding or bruising.  [employer] his insurance company has suggested changing to generic Pradaxa when it becomes available in January.  Patient was also tolerated switching from Keppra to Lamictal 100 twice  daily which she is now tolerating well without side effects.  Family feels that his moodiness is a lot improved since stopping Keppra.  His blood pressure is under good control today is 125/85.  His sugars are also well controlled and last A1c was 6.6.  Patient had MRI scan of the brain on 06/23/2022 which showed expected evolutionary changes in the left anterior frontal MCA branch infarct as well as old remote age large MCA and posterior left MCA branch.  EEG on 07/20/2022 was normal.   Update 06/15/22 SS: Here today for earlier visit. 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 They don't understand his limitations. The previous owners sold. Fernando Rhodes had issues before the stroke. Since his stroke, the car wash implemented a computer system, he can't operate. Since stroke, changes in his behavior, he is more forgetful. Needs help managing medication, double dosed 1 time, gets agitated/irritating with any questioning. His left side has always been his weak side, question if feeling weaker, could be because not about work/sitting around more, hasn't worked since end of April. His work claimed he has slowed down. As a child, he had prolonged fever illness that left him with left sided deficits, brain injury. He didn't want to complete MOCA testing today.  Tearful about not being able to contribute financially.  He remains on  Keppra, Pradaxa.  Update 03/14/22 SS; Sudie Bailey here today for follow-up, 02/09/22 woke from sleep because of leg cramp, had witnessed seizure was full body shaking, snoring respirations. When EMS arrived, left-sided facial droop and weakness.  CT head showed small SAH, likely etiology of seizure, given IV Keppra.  Left-sided weakness chronic since childhood, but left-sided facial droop was new.  On Eliquis for history of blood LE DVT and PE 2021 had septic shock with liver abscess status post  biopsy, switched to Pradaxa given Eliquis failure.  Here today with brother in law, Fernando Rhodes. Still has chronic left sided weakness, facial droop resolved. Is back to baseline. Lives with wife, he never had drivers license. Remains on Keppra 500 mg twice daily. Tolerating well. No seizures. 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 Not taking BP medication currently. Has seen PCP. Denies headaches. Doing well, back at baseline.   -MRI showed small acute left frontal cortical/subcortical infarct and bilateral punctate cerebellar acute infarcts.  Bilateral MCA old infarcts.  -CTA head and neck unremarkable.   -Repeat CT head showed stable appearance of right MCA infarct and encephalomalacia.  Focal hyperdensity along the superomedial aspect of the encephalomalacia likely represents calcification related to prior infarcts, there is no definite blood products seen -Routine EEG was normal -2D echo EF 60 to 65% -LDL 71 -A1c 5.7  HISTORY  Copied Dr. Roda Shutters note 02/11/22: Fernando Rhodes is a 67 y.o. 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 hospital ED as a code stroke with LKW 2130 on 02/09/22. He presented with left facial droop and left sided weakness especially in left hand. Wife noted generalized shaking and snoring like breathing, likely a seizure. He takes Eliquis for history of blood clot and wife says they make sure he is compliant. Patient was found to have SAH and likely first time seizure, loaded with levetiracetam 1,000mg  then continued on maintenance 500 mg BID. Routine EEG wnl. Left sided weakness is chronic since childhood, confirmed by sister, but L facial droop is new.   REVIEW OF SYSTEMS: Out of a complete 14 system review of symptoms, the patient complains only of the following symptoms, and all other reviewed systems are negative.  See HPI  ALLERGIES: No Known Allergies  HOME  MEDICATIONS: Outpatient Medications Prior to Visit  Medication Sig Dispense Refill   acetaminophen (TYLENOL) 500 MG tablet Take 1,000 mg by mouth every 6 (six) hours as needed for headache (pain).     amLODipine (NORVASC) 10 MG tablet Take 1 tablet (10 mg total) by mouth daily. 90 tablet 3   apixaban (ELIQUIS) 5 MG TABS tablet Take 1 tablet (5 mg total) by mouth 2 (two) times daily. 60 tablet 5   FERROUS SULFATE PO Take 1 tablet by mouth daily. Unsure of strength     lamoTRIgine (LAMICTAL) 100 MG tablet Take 1 tablet (100 mg total) by mouth 2 (two) times daily. 180 tablet 3   metFORMIN (GLUCOPHAGE) 1000 MG tablet Take 1 tablet (1,000 mg total) by mouth 2 (two) times daily with a meal. 180 tablet 3   Multiple Vitamins-Minerals (MULTIVITAMIN GUMMIES ADULT PO) Take 2 tablets by mouth daily. Take 2 gummies daily     simvastatin (ZOCOR) 20 MG tablet Take 1 tablet (20 mg total) by mouth daily. 90 tablet 2   amLODipine (NORVASC) 5 MG tablet Take 1 tablet by mouth  once daily (Patient not taking: Reported on 10/23/2023) 90 tablet 0   No facility-administered medications prior to visit.    PAST MEDICAL HISTORY: Past Medical History:  Diagnosis Date   Basal cell carcinoma of skin 03/14/2022   Diabetes mellitus without complication (HCC)    Hypertension    Renal insufficiency 06/01/2020    PAST SURGICAL HISTORY: Past Surgical History:  Procedure Laterality Date   ABDOMINAL SURGERY     IR US GUIDE BX ASP/DRAIN  06/01/2020    FAMILY HISTORY: History reviewed. No pertinent family history.  SOCIAL HISTORY: Social History   Socioeconomic History   Marital status: Single    Spouse name: Not on file   Number of children: Not on file   Years of education: Not on file   Highest education level: Not on file  Occupational History   Not on file  Tobacco Use   Smoking status: Never   Smokeless tobacco: Never  Substance and Sexual Activity   Alcohol use: No   Drug use: No   Sexual activity: Not  on file  Other Topics Concern   Not on file  Social History Narrative   Not on file   Social Determinants of Health   Financial Resource Strain: Not on file  Food Insecurity: Not on file  Transportation Needs: Not on file  Physical Activity: Not on file  Stress: Not on file  Social Connections: Unknown (04/25/2022)   Received from [facility]   Social Network    Social Network: Not on file  Intimate Partner Violence: Unknown (03/17/2022)   Received from [facility], [facility]   HITS    Physically Hurt: Not on file    Insult or Talk Down To: Not on file    Threaten Physical Harm: Not on file    Scream or Curse: Not on file   PHYSICAL EXAM  Vitals:   10/23/23 1323  BP: 125/85  Pulse: 87  Weight: 175 lb (79.4 kg)  Height: 5\' 9"  (1.753 m)   Body mass index is 25.84 kg/m.  Generalized: Well developed, in no acute distress  Neurological examination  Mentation: Alert oriented to time, place, diminished attention registration and recall.  Follows all commands, speech is slightly dysarthric, but is understandable.  Seems happy, upbeat. Cranial nerve II-XII: Mild asymmetry to left eye compared to right. Pupils were equal round reactive to light. Extraocular movements were full, visual field were full on confrontational test. Facial sensation and strength were normal. Head turning and shoulder shrug  were normal and symmetric. Smile appears very slight droop on left. Motor: Mild weakness to the left arm and leg 4.5/5, atrophy of left arm noted, flexion of fingers.  Increased tone with mild spasticity in the left arm and left leg. Sensory: Decreased sensation to left face, arm, leg Coordination: Cerebellar testing reveals good finger-nose-finger and heel-to-shin bilaterally.  Gait and station: Gait is slightly wide-based, slightly more limp on the left that noted before, but is independent Reflexes: Deep tendon reflexes are symmetric but increased on the  left  DIAGNOSTIC DATA (LABS, IMAGING, TESTING) - I reviewed patient records, labs, notes, testing and imaging myself where available.  Lab Results  Component Value Date   WBC 7.9 02/10/2022   HGB 13.9 02/10/2022   HCT 41.0 02/10/2022   MCV 95.7 02/10/2022   PLT 200 02/10/2022      Component Value Date/Time   NA 139 02/11/2022 0430   K 3.3 (L) 02/11/2022 0430   CL  105 02/11/2022 0430   CO2 24 02/11/2022 0430   GLUCOSE 109 (H) 02/11/2022 0430   BUN 11 02/11/2022 0430   CREATININE 1.03 02/11/2022 0430   CALCIUM 8.3 (L) 02/11/2022 0430   PROT 6.4 (L) 02/10/2022 0139   ALBUMIN 3.6 02/10/2022 0139   AST 24 02/10/2022 0139   ALT 15 02/10/2022 0139   ALKPHOS 78 02/10/2022 0139   BILITOT 0.6 02/10/2022 0139   GFRNONAA >60 02/11/2022 0430   GFRAA >60 06/04/2020 0422   Lab Results  Component Value Date   CHOL 141 02/11/2022   HDL 51 02/11/2022   LDLCALC 71 02/11/2022   TRIG 97 02/11/2022   CHOLHDL 2.8 02/11/2022   Lab Results  Component Value Date   HGBA1C 5.7 (H) 02/10/2022   No results found for: "VITAMINB12" No results found for: "TSH"  ASSESSMENT AND PLAN 67 y.o. year old male   1. Stroke, multifocal infarcts March 2023 due to Eliquis failure 2. Seizure, single seizure 02/10/22, at high risk for recurrent seizure with history of stroke, TBI 3.  History of prolonged febrile illness as a child with subsequent traumatic brain injury, chronic left-sided weakness, intellectual disability 4.  History of DVT, PE  -I had a long d/w patient  and his brother in law about his recent stroke, paroxysmal atrial fibrillation,risk for recurrent stroke/TIAs, personally independently reviewed imaging studies and stroke evaluation results and answered questions.Continue Eliquis (apixaban) daily  for secondary stroke prevention and maintain strict control of hypertension with blood pressure goal below 130/90, diabetes with hemoglobin A1c goal below 6.5% and lipids with LDL cholesterol goal  below 70 mg/dL. I also advised the patient to eat a healthy diet with plenty of whole grains, cereals, fruits and vegetables, exercise regularly and maintain ideal body weight . Check lipid panel, HbA1c and carotid ultrasound.Followup in the future with my NP in 1 year or call earlier if needed.  Greater than 50% time during this 35-minute visit was spent in counseling and coordination of care about his recent strokes and discussion about anticoagulation and Eliquis risk-benefit and answering questions  Delia Heady, MD11/11/2023, 1:55 PM [facility] [address], [address] [place], [place] [address] [phone]

## 2023-10-23 NOTE — Patient Instructions (Signed)
I had a long d/w patient  and his brother in law about his recent stroke, paroxysmal atrial fibrillation,risk for recurrent stroke/TIAs, personally independently reviewed imaging studies and stroke evaluation results and answered questions.Continue Eliquis (apixaban) daily  for secondary stroke prevention and maintain strict control of hypertension with blood pressure goal below 130/90, diabetes with hemoglobin A1c goal below 6.5% and lipids with LDL cholesterol goal below 70 mg/dL. I also advised the patient to eat a healthy diet with plenty of whole grains, cereals, fruits and vegetables, exercise regularly and maintain ideal body weight . Check lipid panel, HbA1c and carotid ultrasound.Followup in the future with my NP in 1 year or call earlier if needed.

## 2023-10-24 LAB — LIPID PANEL
Chol/HDL Ratio: 3.6 ratio (ref 0.0–5.0)
Cholesterol, Total: 174 mg/dL (ref 100–199)
HDL: 49 mg/dL (ref >39)
LDL Chol Calc (NIH): 79 mg/dL (ref 0–99)
Triglycerides: 282 mg/dL — ABNORMAL HIGH (ref 0–149)
VLDL Cholesterol Cal: 46 mg/dL — ABNORMAL HIGH (ref 5–40)

## 2023-10-24 LAB — HEMOGLOBIN A1C
Est. average glucose Bld gHb Est-mCnc: 151 mg/dL
Hgb A1c MFr Bld: 6.9 % — ABNORMAL HIGH (ref 4.8–5.6)

## 2023-11-02 ENCOUNTER — Other Ambulatory Visit (HOSPITAL_COMMUNITY): Payer: Self-pay

## 2023-11-05 ENCOUNTER — Other Ambulatory Visit (HOSPITAL_COMMUNITY): Payer: Self-pay

## 2023-11-05 MED ORDER — AMLODIPINE BESYLATE 10 MG PO TABS
10.0000 mg | ORAL_TABLET | Freq: Every day | ORAL | 0 refills | Status: AC
  Filled 2023-12-20: qty 90, 90d supply, fill #0

## 2023-11-05 MED ORDER — LAMOTRIGINE 100 MG PO TABS
100.0000 mg | ORAL_TABLET | Freq: Two times a day (BID) | ORAL | 3 refills | Status: DC
  Filled 2023-12-19: qty 180, 90d supply, fill #0
  Filled 2024-03-18: qty 180, 90d supply, fill #1

## 2023-11-05 MED ORDER — METFORMIN HCL ER 500 MG PO TB24: 1000.0000 mg | ORAL_TABLET | Freq: Two times a day (BID) | ORAL | 3 refills | Status: AC

## 2023-11-07 ENCOUNTER — Other Ambulatory Visit (HOSPITAL_COMMUNITY): Payer: Self-pay

## 2023-11-11 NOTE — Progress Notes (Signed)
Kindly inform the patient that cholesterol profile shows that bad cholesterol and triglycerides are elevated.  I recommend increasing the dose of Lipitor to 40 mg daily.  Also asked him to see primary care physician to seek help to lower his triglycerides

## 2023-11-14 ENCOUNTER — Other Ambulatory Visit (HOSPITAL_COMMUNITY): Payer: Self-pay

## 2023-11-16 ENCOUNTER — Other Ambulatory Visit (HOSPITAL_COMMUNITY): Payer: Self-pay

## 2023-11-22 DIAGNOSIS — I69898 Other sequelae of other cerebrovascular disease: Secondary | ICD-10-CM | POA: Diagnosis not present

## 2023-11-22 DIAGNOSIS — H2513 Age-related nuclear cataract, bilateral: Secondary | ICD-10-CM | POA: Diagnosis not present

## 2023-11-22 DIAGNOSIS — E119 Type 2 diabetes mellitus without complications: Secondary | ICD-10-CM | POA: Diagnosis not present

## 2023-11-22 DIAGNOSIS — H35033 Hypertensive retinopathy, bilateral: Secondary | ICD-10-CM | POA: Diagnosis not present

## 2023-11-22 DIAGNOSIS — H53022 Refractive amblyopia, left eye: Secondary | ICD-10-CM | POA: Diagnosis not present

## 2023-11-26 ENCOUNTER — Other Ambulatory Visit: Payer: Self-pay | Admitting: Neurology

## 2023-11-26 ENCOUNTER — Ambulatory Visit (HOSPITAL_COMMUNITY)
Admission: RE | Admit: 2023-11-26 | Discharge: 2023-11-26 | Disposition: A | Discharge status: Home / Self Care | Payer: Medicare Other | Source: Ambulatory Visit | Attending: Neurology | Admitting: Neurology

## 2023-11-26 DIAGNOSIS — Z8673 Personal history of transient ischemic attack (TIA), and cerebral infarction without residual deficits: Secondary | ICD-10-CM | POA: Insufficient documentation

## 2023-11-26 DIAGNOSIS — I480 Paroxysmal atrial fibrillation: Secondary | ICD-10-CM

## 2023-11-26 NOTE — Telephone Encounter (Signed)
NEEDS NEW METABOLIC PANEL FOR ELIQUIS REFILL. STILL QUALIFIES FOR REFILL AS AGE LESS THAN 80 AND WT OVER 60KG. ROUTING TO PROVIDER TO SEE IF WILLING TO ORDER METABOLIC PANEL FOR SCR.   Prescription refill request for Eliquis received. Indication:DVT Last office visit:10/23/23 Scr:1.03 02/11/22  OUTDATED Age: 67 YR OLD MALE Weight:79.4KG

## 2023-12-07 IMAGING — CT CT HEAD CODE STROKE
3 series · 14 of 47 positions shown, 16 images · non-contrast
Comparison: None.

CLINICAL DATA: Code stroke.



[Series 2: head 5.0 st · axial · 0.40mm/px · z∈[-90,+40]mm · 8 of 32 slices shown, 10 images]
[im 3/32  brain]
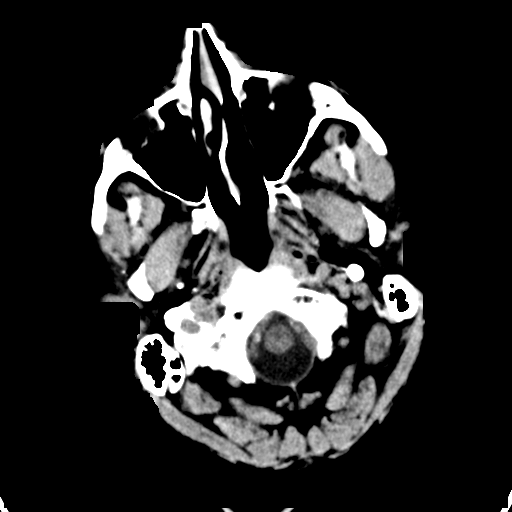
[im 3/32  bone]
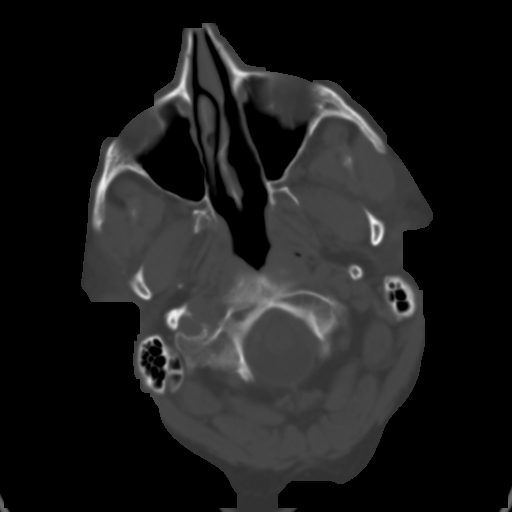
[im 7/32  brain]
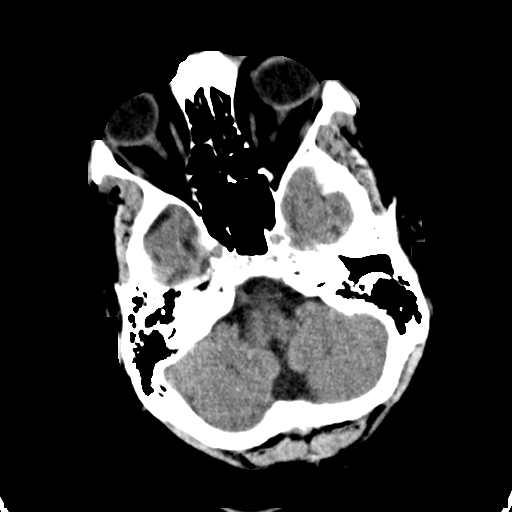
[im 10/32  brain]
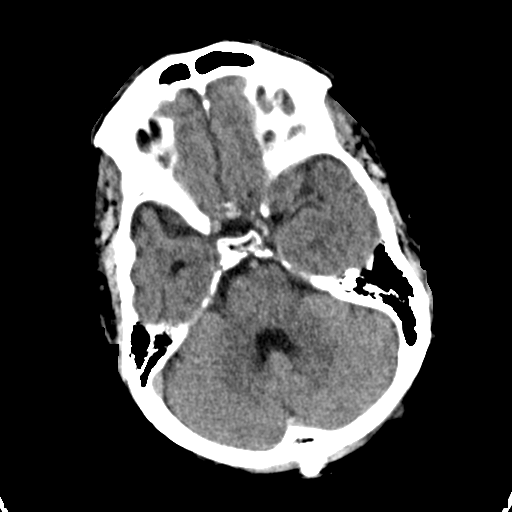
[im 14/32  brain]
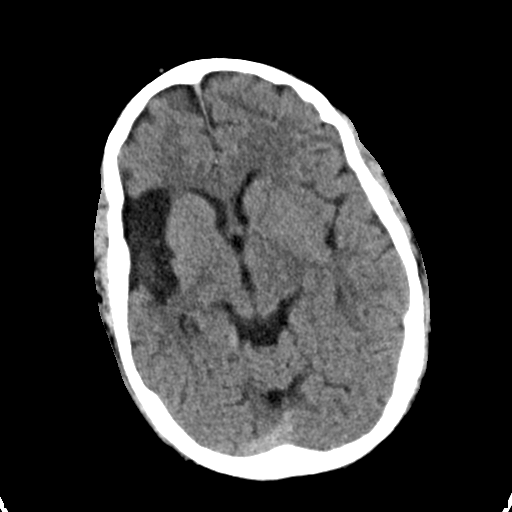
[im 18/32  brain]
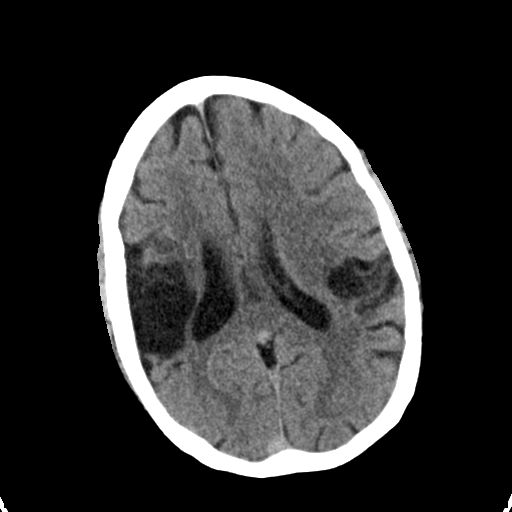
[im 18/32  bone]
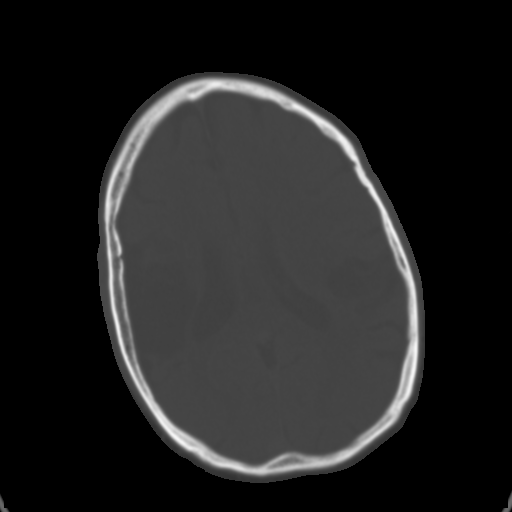
[im 22/32  brain]
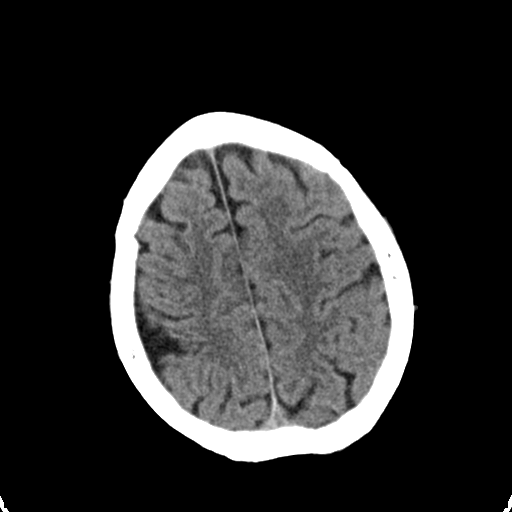
[im 25/32  brain]
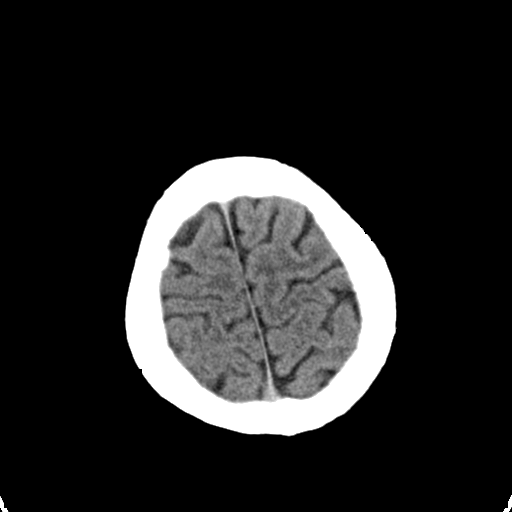
[im 29/32  brain]
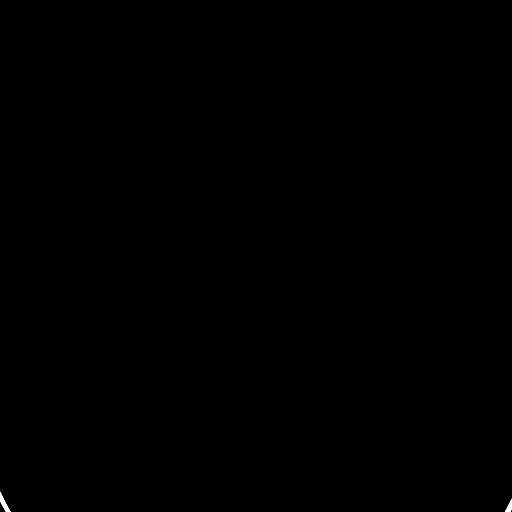

[Series 4: head 3.0 cor st · coronal · 0.33mm/px · 3 of 67 slices shown]
[im 23/67  brain]
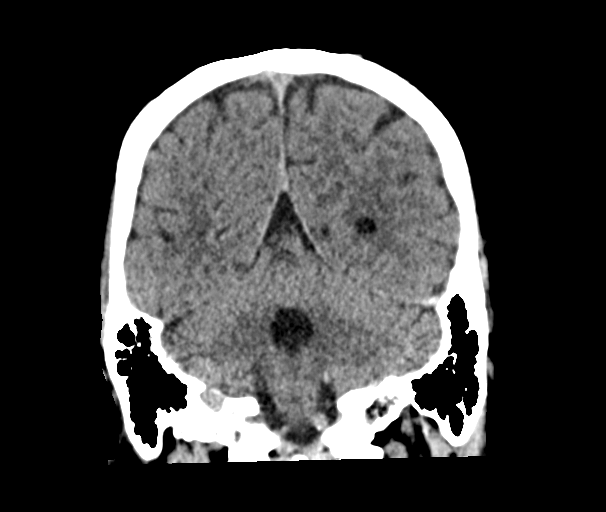
[im 30/67  brain]
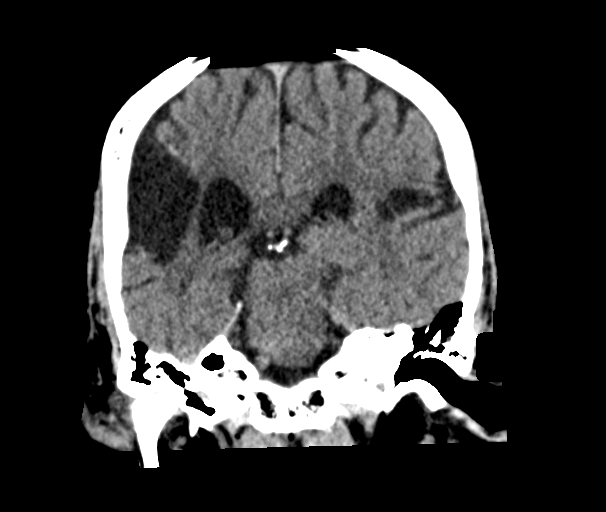
[im 37/67  brain]
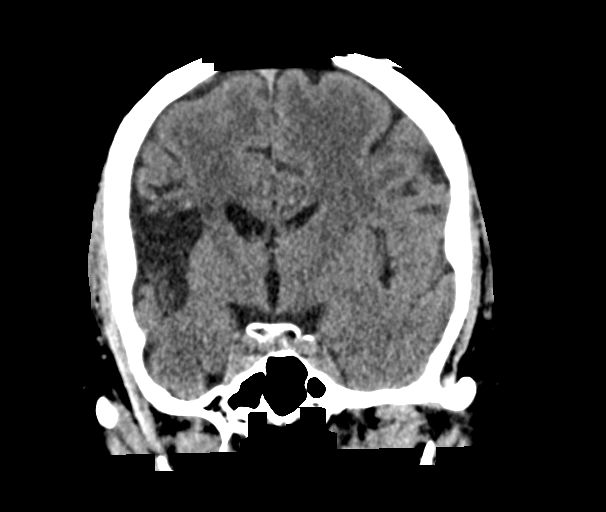

[Series 5: head 3.0 sag st · sagittal · 0.30mm/px · 3 of 64 slices shown]
[im 22/64  brain]
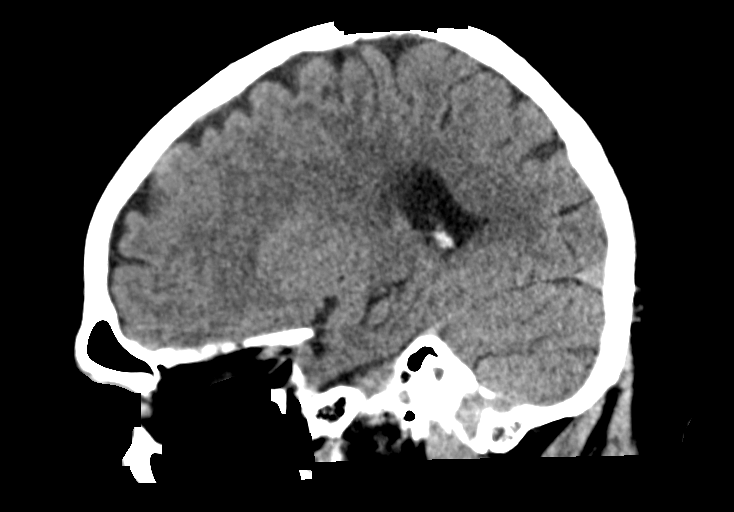
[im 32/64  brain]
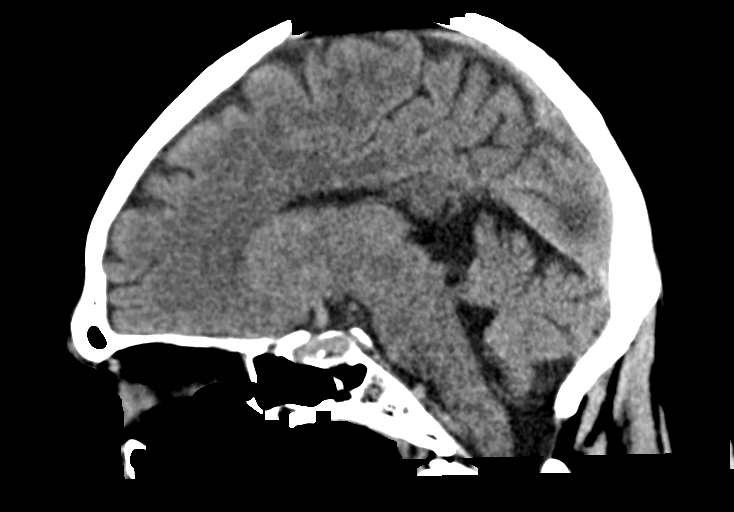
[im 43/64  brain]
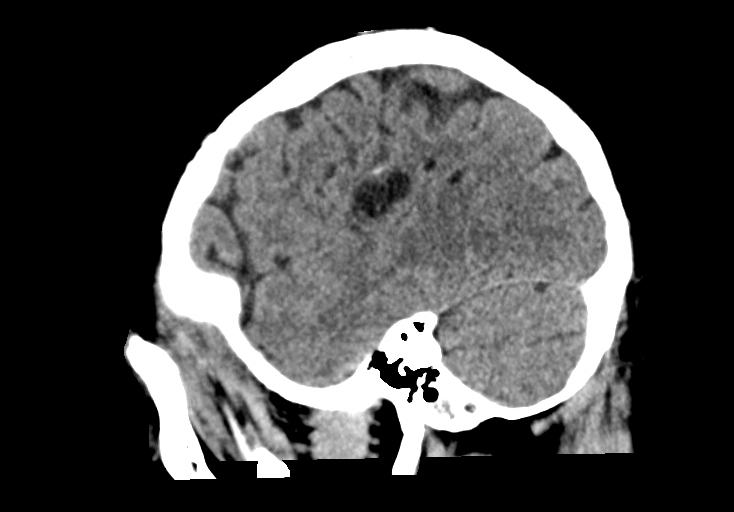

[14 of 47 positions shown; findings below may reference images not displayed]

FINDINGS: Brain: There is an old right insular/opercular infarct. At the
superomedial aspect of the area of encephalomalacia, there is a
small focus of subarachnoid hyperdensity. No mass effect.

Vascular: No abnormal hyperdensity of the major intracranial
arteries or dural venous sinuses. No intracranial atherosclerosis.

Skull: The visualized skull base, calvarium and extracranial soft
tissues are normal.

Sinuses/Orbits: No fluid levels or advanced mucosal thickening of
the visualized paranasal sinuses. No mastoid or middle ear effusion.
The orbits are normal.
IMPRESSION: 1. Small focus of subarachnoid hyperdensity at the superomedial
aspect of the area of encephalomalacia, favored to be a small amount
of subarachnoid hemorrhage.
2. Old right insular/opercular infarct.

Critical Value/emergent results were called by telephone at the time
of interpretation on 02/10/2022 at [DATE] to provider Nura M Jim,
who verbally acknowledged these results.

## 2023-12-07 IMAGING — MR MR HEAD W/O CM
8 of 10 series · 38 of 48 positions shown · non-contrast
Comparison: Head CT 02/10/2022

CLINICAL DATA: Possible subarachnoid hemorrhage.  Stroke suspected.

EXAM:
MRI HEAD WITHOUT CONTRAST
TECHNIQUE: Multiplanar, multiecho pulse sequences of the brain and surrounding
structures were obtained without intravenous contrast.

[Series 3: DWI · axial · 3.0mm · 1.09mm/px · z∈[-105,+45]mm · 9 of 104 slices shown (1 of 4)]
[im 1/104]
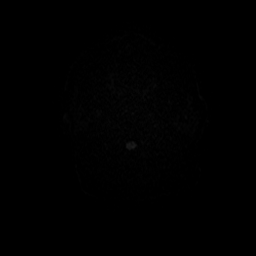
[im 19/104]
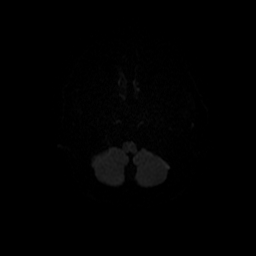
[im 29/104]
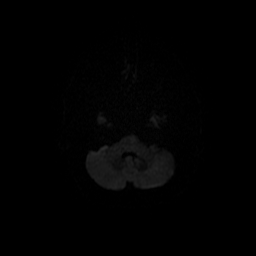
[im 47/104]
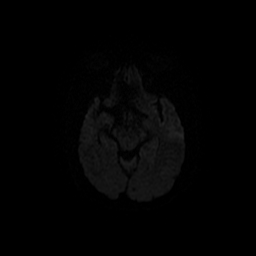
[im 57/104]
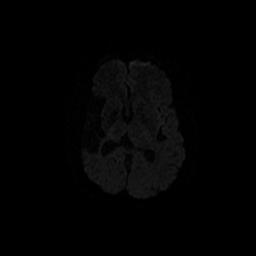
[im 75/104]
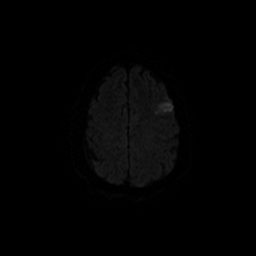
[im 85/104]
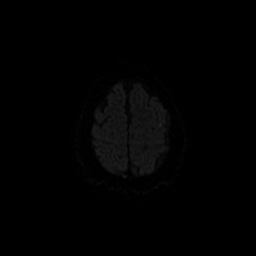
[im 94/104]
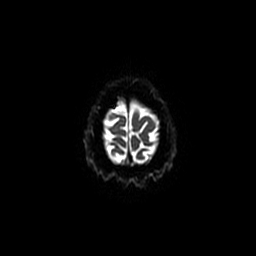
[im 104/104]
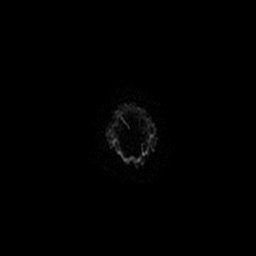

[Series 4: DWI · coronal · 5.0mm · 1.09mm/px · 8 of 72 slices shown (2 of 4)]
[im 1/72]
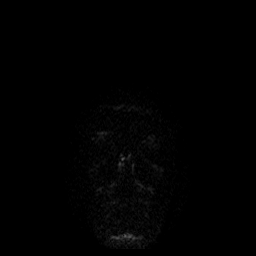
[im 11/72]
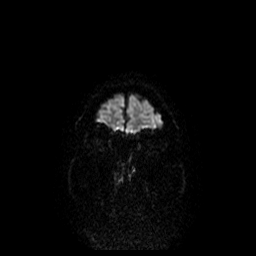
[im 21/72]
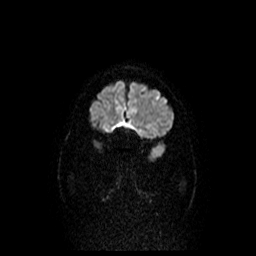
[im 31/72]
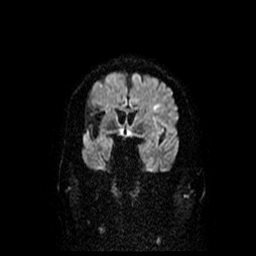
[im 41/72]
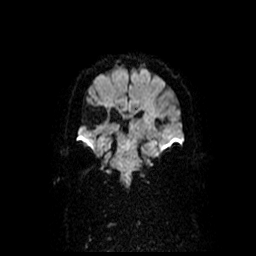
[im 51/72]
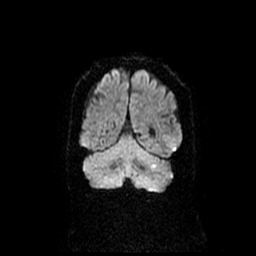
[im 61/72]
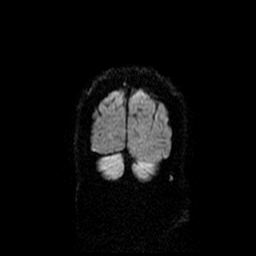
[im 72/72]
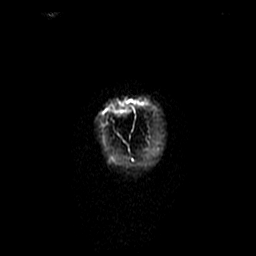

[Series 5: T1 · sagittal · 5.0mm · 0.47mm/px · 2 of 23 slices shown]
[im 1/23]
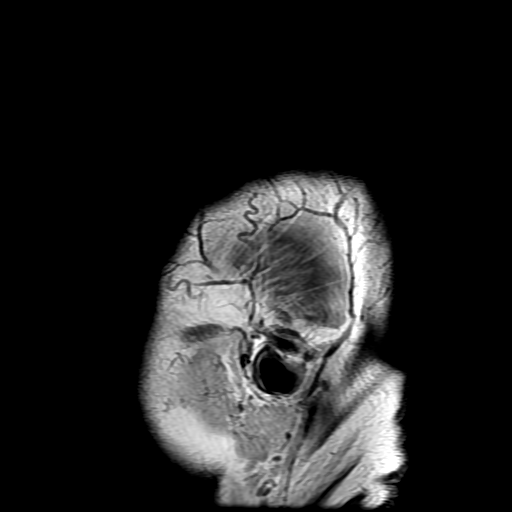
[im 12/23]
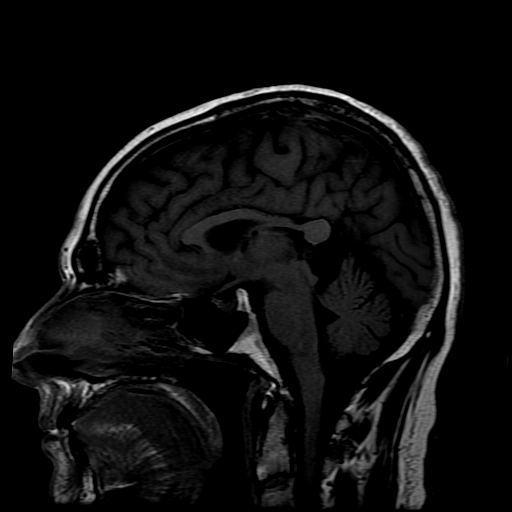

[Series 6: T2 · axial · 5.0mm · 0.43mm/px · z∈[-99,+48]mm · 3 of 26 slices shown (1 of 2)]
[im 1/26]
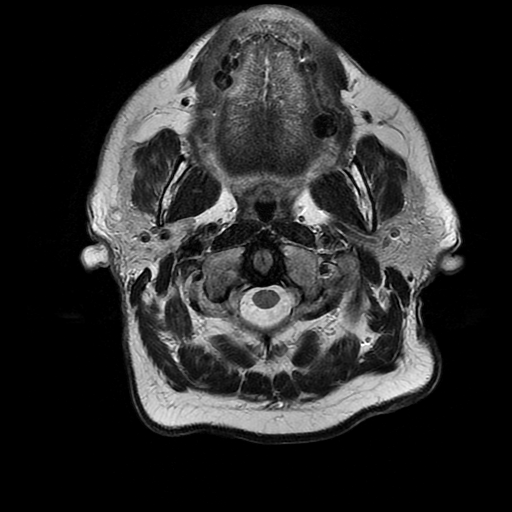
[im 13/26]
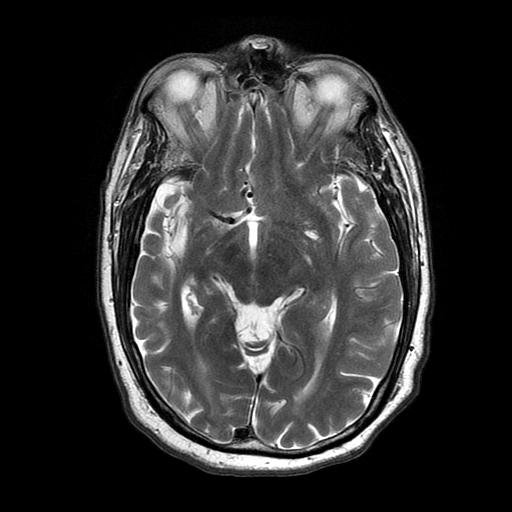
[im 26/26]
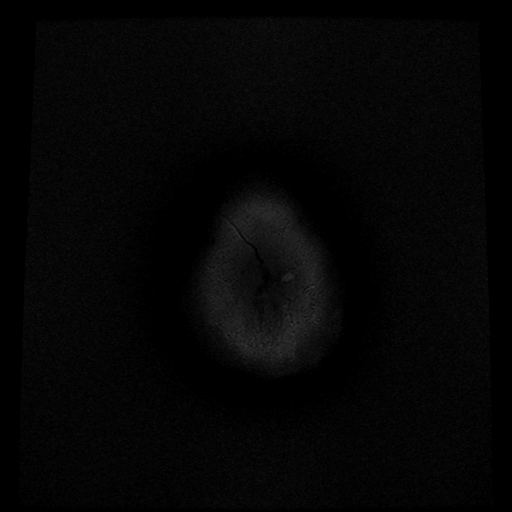

[Series 10: T2 · coronal · 5.0mm · 0.43mm/px · 3 of 30 slices shown (2 of 2)]
[im 1/30]
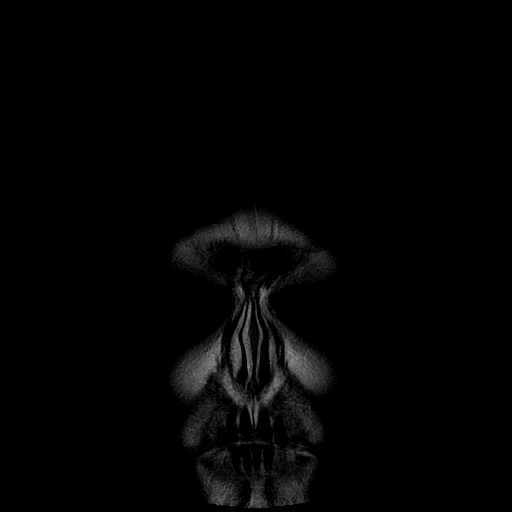
[im 15/30]
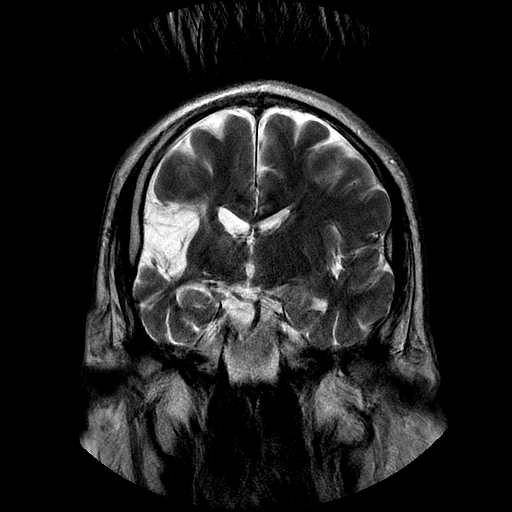
[im 30/30]
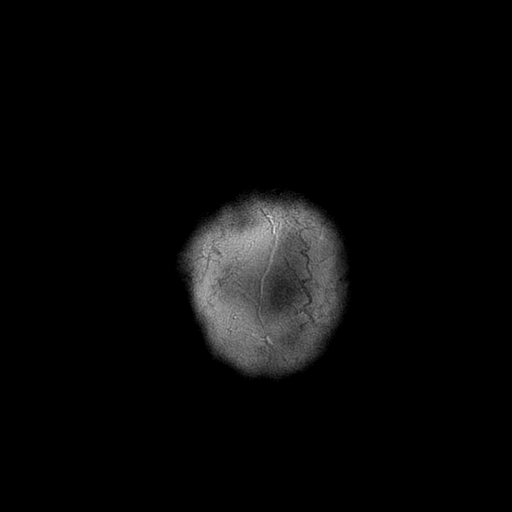

[Series 11: FLAIR · axial · 5.0mm · 0.43mm/px · z∈[-99,+48]mm · 3 of 26 slices shown]
[im 1/26]
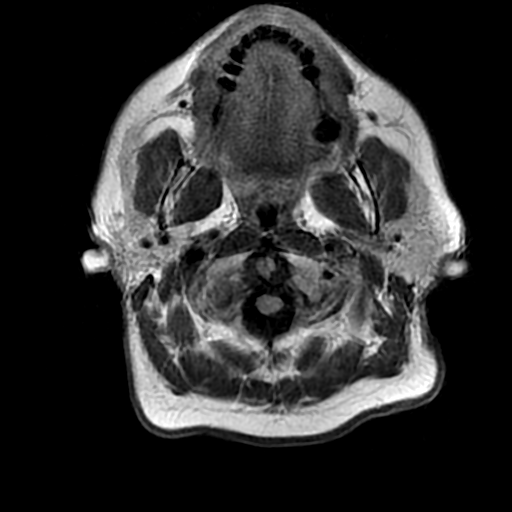
[im 13/26]
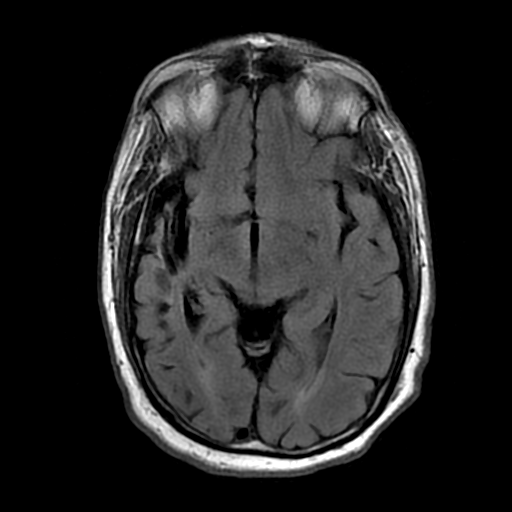
[im 26/26]
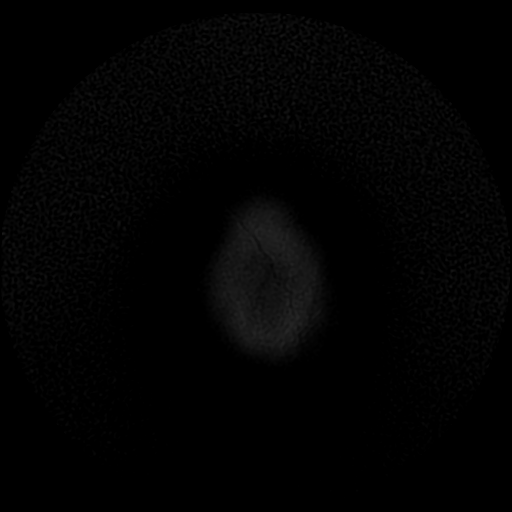

[Series 300: DWI · axial · 3.0mm · 1.09mm/px · z∈[-105,+45]mm · 6 of 52 slices shown (3 of 4)]
[im 1/52]
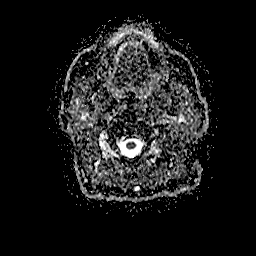
[im 11/52]
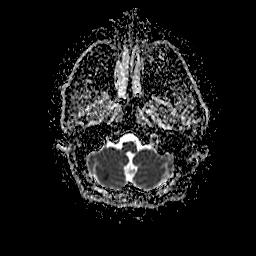
[im 21/52]
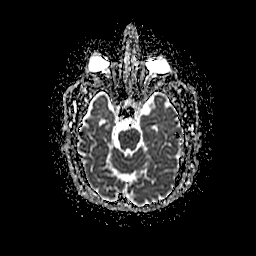
[im 31/52]
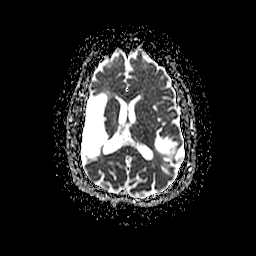
[im 41/52]
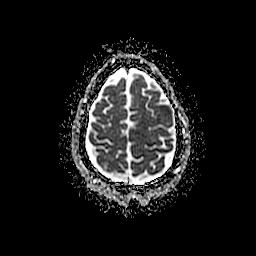
[im 52/52]
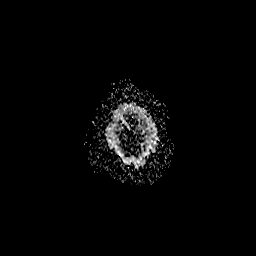

[Series 400: DWI · coronal · 5.0mm · 1.09mm/px · 4 of 36 slices shown (4 of 4)]
[im 1/36]
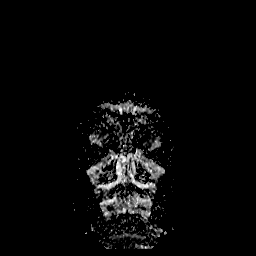
[im 12/36]
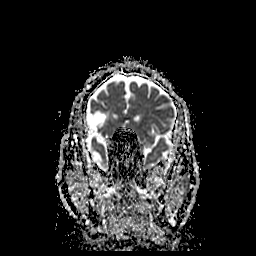
[im 24/36]
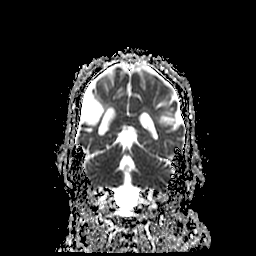
[im 36/36]
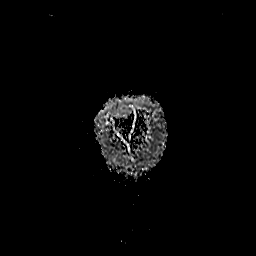

[38 of 48 positions shown; findings below may reference images not displayed]

FINDINGS: Brain: There is a small left frontal cortical/subcortical acute
infarct and bilateral punctate cerebellar acute infarcts. Old right
frontal infarct is unchanged. No acute or chronic hemorrhage. There
is multifocal hyperintense T2-weighted signal within the white
matter. Generalized volume loss without a clear lobar predilection.
The midline structures are normal. No focal abnormality to correlate
with a small hyperdensity seen on the earlier CT.

Vascular: Major flow voids are preserved.

Skull and upper cervical spine: Normal calvarium and skull base.
Visualized upper cervical spine and soft tissues are normal.

Sinuses/Orbits:No paranasal sinus fluid levels or advanced mucosal
thickening. No mastoid or middle ear effusion. Normal orbits.
IMPRESSION: 1. Small acute left frontal cortical/subcortical infarct and
bilateral punctate cerebellar acute infarcts. No hemorrhage or mass
effect.
2. Old right frontal infarct and findings of chronic small vessel
disease.

## 2023-12-07 IMAGING — CT CT HEAD W/O CM
4 series · 16 of 47 positions shown, 18 images · non-contrast
Comparison: CT head and CTA head and neck 02/10/2022

CLINICAL DATA: Subarachnoid hemorrhage.



[Series 3: head without · axial · non-contrast · 0.42mm/px · z∈[-104,+16]mm · 7 of 32 slices shown, 9 images]
[im 4/32  brain]
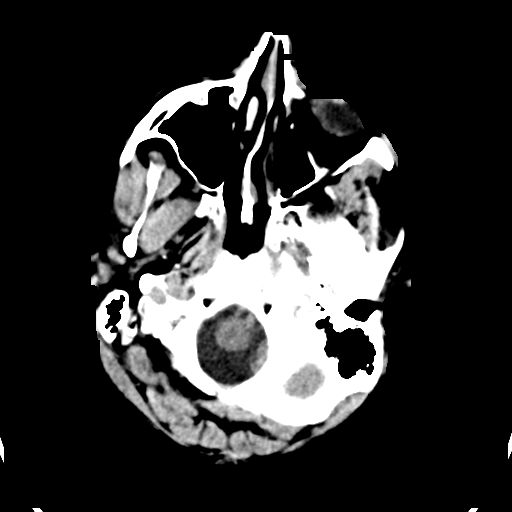
[im 4/32  bone]
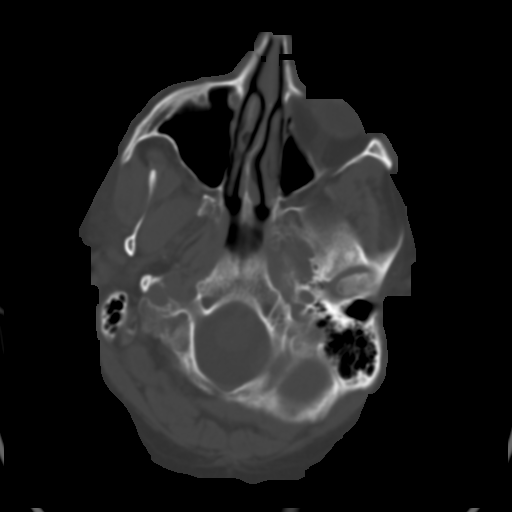
[im 8/32  brain]
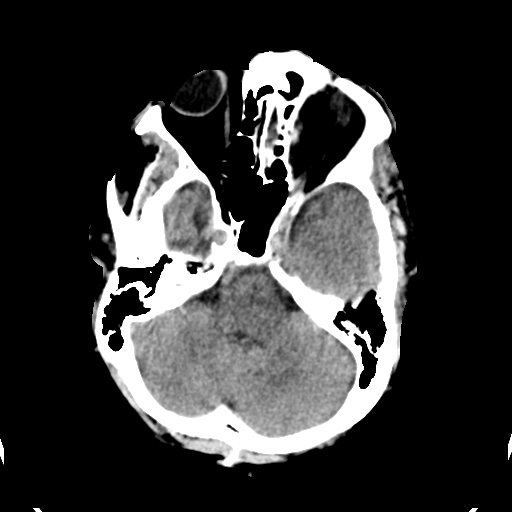
[im 12/32  brain]
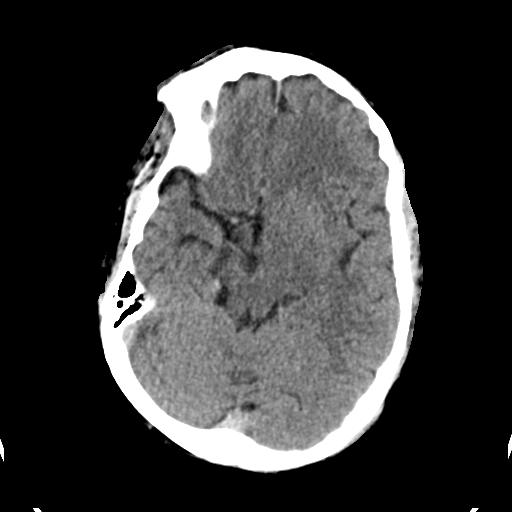
[im 16/32  brain]
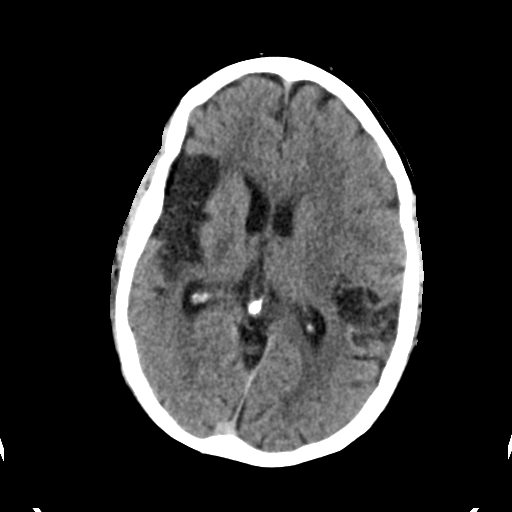
[im 20/32  brain]
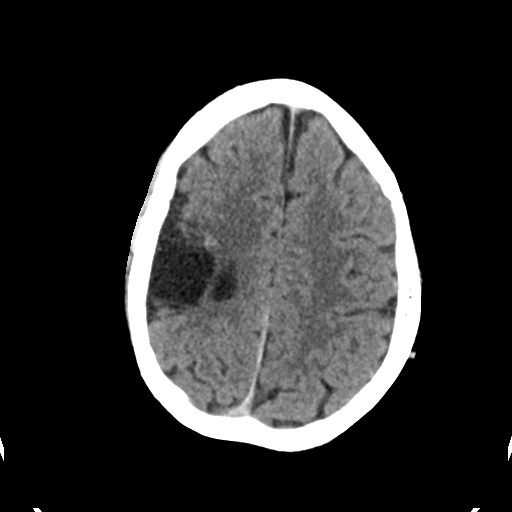
[im 20/32  bone]
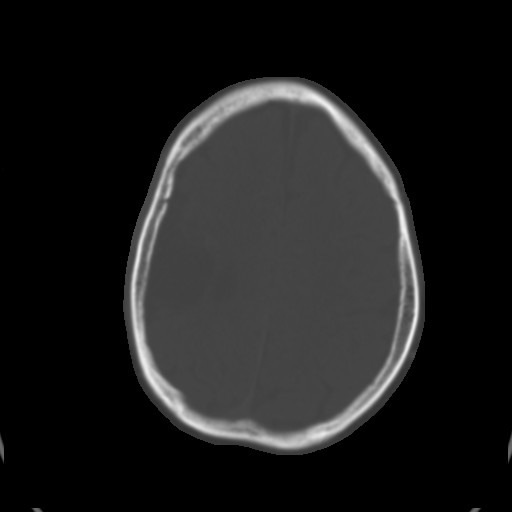
[im 24/32  brain]
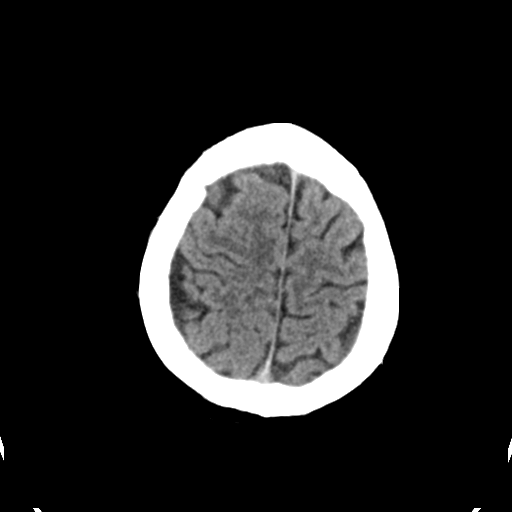
[im 28/32  brain]
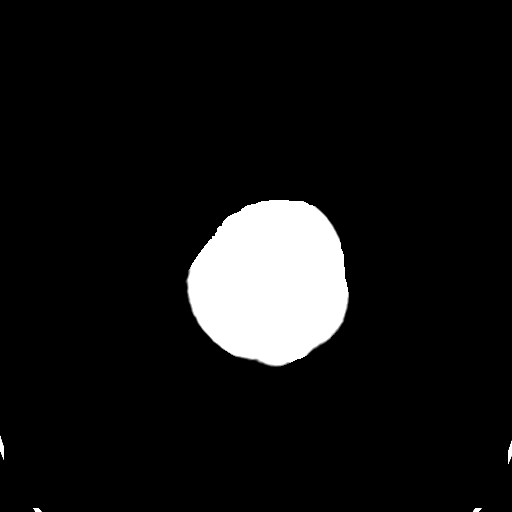

[Series 4: head bone · axial · 0.42mm/px · z∈[-104,-72]mm · 3 of 80 slices shown]
[im 8/80  bone]
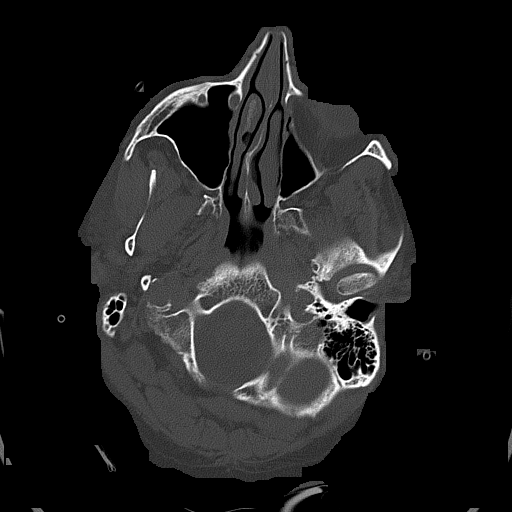
[im 16/80  bone]
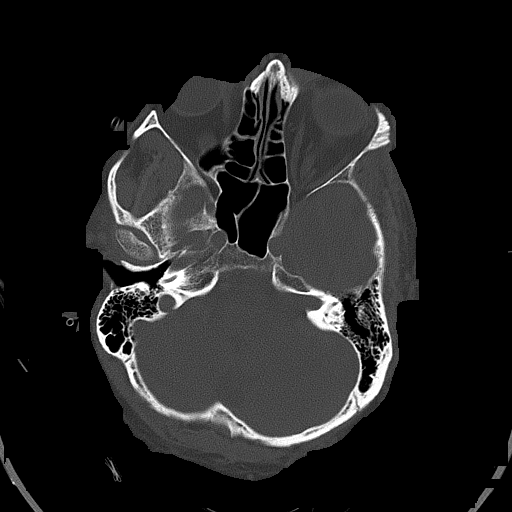
[im 24/80  bone]
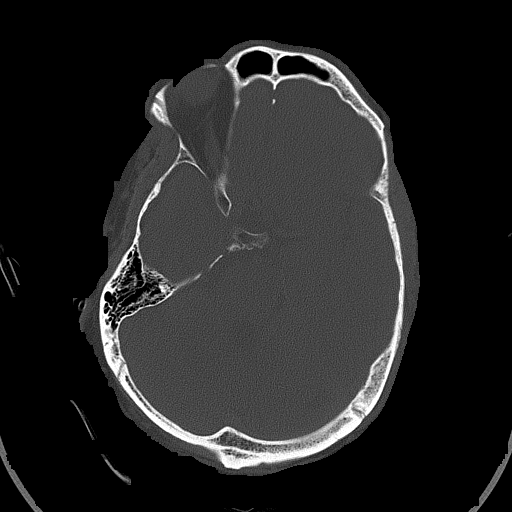

[Series 5: head without cor · coronal · non-contrast · 0.31mm/px · 3 of 65 slices shown]
[im 22/65  brain]
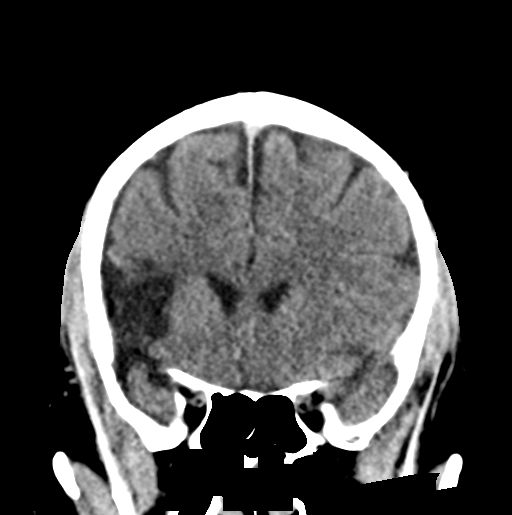
[im 29/65  brain]
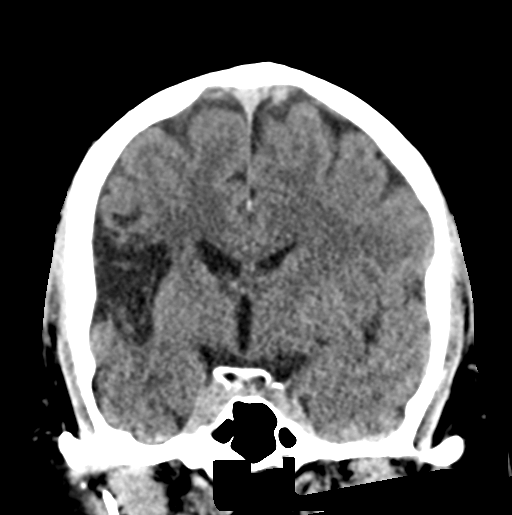
[im 36/65  brain]
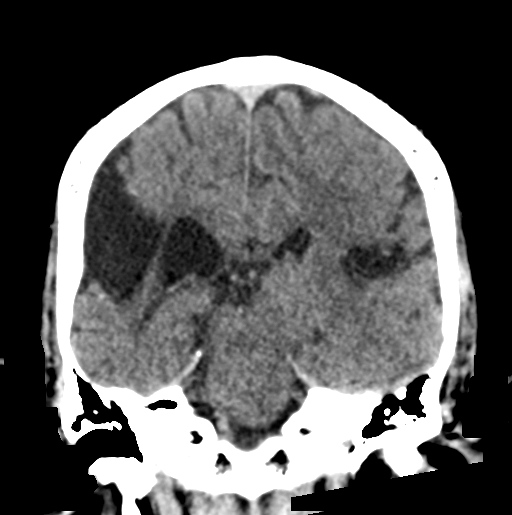

[Series 6: head without sag · sagittal · non-contrast · 0.31mm/px · 3 of 55 slices shown]
[im 22/55  brain]
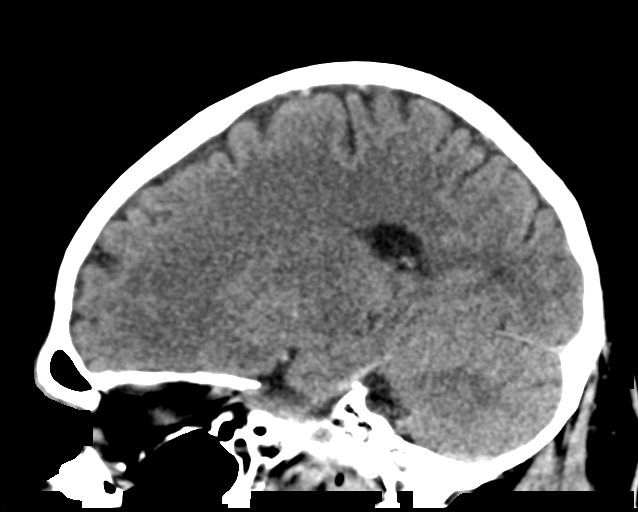
[im 28/55  brain]
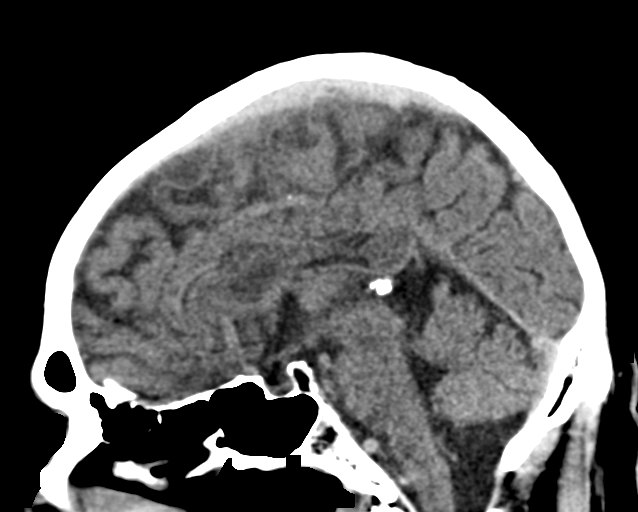
[im 33/55  brain]
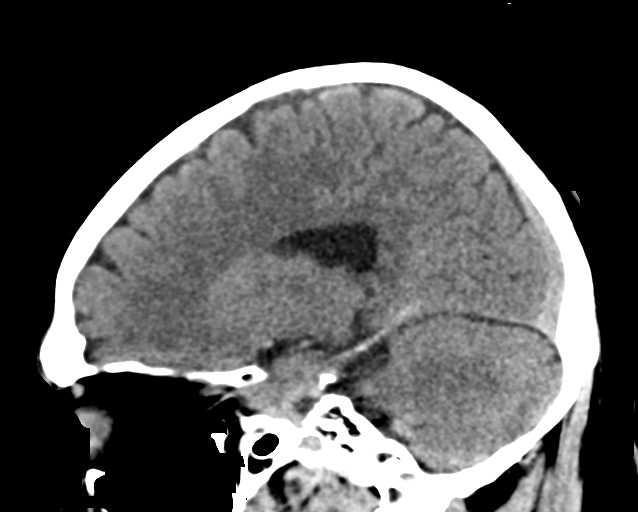

[16 of 47 positions shown; findings below may reference images not displayed]

FINDINGS: Brain: The right MCA infarct and encephalomalacia is again noted.
Focal hyperdensity along the superomedial aspect of the
encephalomalacia is stable. The area is more sharply defined on this
study. An additional linear hyperdensity is present along the
posterior superior aspect of the encephalomalacia, also sharply
defined.

No new hyperdensities are present.

No acute infarct is present. Basal ganglia are unchanged. Ex vacuo
dilation of the right lateral ventricle is stable. A remote infarct
in the posterior left operculum is stable. Left hemisphere is
otherwise unremarkable.

The brainstem and cerebellum are within normal limits.

Vascular: No hyperdense vessel or unexpected calcification.

Skull: Calvarium is intact. No focal lytic or blastic lesions are
present. No significant extracranial soft tissue lesion is present.

Sinuses/Orbits: The paranasal sinuses and mastoid air cells are
clear. The globes and orbits are within normal limits.
IMPRESSION: 1. Stable appearance of right MCA infarct and encephalomalacia.
2. Focal hyperdensity along the superomedial aspect of the
encephalomalacia is more sharply defined on this study. This likely
represents calcification related to the prior infarcts. No definite
blood products are seen.
3. Stable remote infarct of the posterior left operculum. Of note a
similar linear calcification is present.
4. Follow-up head CT in [DATE] hours could be used to confirm
stability of these hyperdensities.

## 2023-12-19 ENCOUNTER — Other Ambulatory Visit (HOSPITAL_COMMUNITY): Payer: Self-pay

## 2023-12-20 ENCOUNTER — Other Ambulatory Visit (HOSPITAL_COMMUNITY): Payer: Self-pay

## 2023-12-20 DIAGNOSIS — J029 Acute pharyngitis, unspecified: Secondary | ICD-10-CM | POA: Diagnosis not present

## 2023-12-20 DIAGNOSIS — N1831 Chronic kidney disease, stage 3a: Secondary | ICD-10-CM | POA: Diagnosis not present

## 2023-12-20 DIAGNOSIS — J069 Acute upper respiratory infection, unspecified: Secondary | ICD-10-CM | POA: Diagnosis not present

## 2023-12-20 DIAGNOSIS — E11620 Type 2 diabetes mellitus with diabetic dermatitis: Secondary | ICD-10-CM | POA: Diagnosis not present

## 2023-12-20 DIAGNOSIS — R21 Rash and other nonspecific skin eruption: Secondary | ICD-10-CM | POA: Diagnosis not present

## 2024-01-21 DIAGNOSIS — Z7901 Long term (current) use of anticoagulants: Secondary | ICD-10-CM | POA: Diagnosis not present

## 2024-01-21 DIAGNOSIS — I4891 Unspecified atrial fibrillation: Secondary | ICD-10-CM | POA: Diagnosis not present

## 2024-01-21 DIAGNOSIS — Z8673 Personal history of transient ischemic attack (TIA), and cerebral infarction without residual deficits: Secondary | ICD-10-CM | POA: Diagnosis not present

## 2024-01-21 DIAGNOSIS — Z86711 Personal history of pulmonary embolism: Secondary | ICD-10-CM | POA: Diagnosis not present

## 2024-01-22 DIAGNOSIS — L308 Other specified dermatitis: Secondary | ICD-10-CM | POA: Diagnosis not present

## 2024-01-23 ENCOUNTER — Telehealth: Payer: Self-pay | Admitting: Neurology

## 2024-01-23 NOTE — Telephone Encounter (Signed)
Clearance letter signed by Maralyn Sago NP in Dr Marlis Edelson absence. Faxed letter and had confirmation from fax received.

## 2024-01-23 NOTE — Telephone Encounter (Signed)
Surgical clearance was sent over for the patient to move forward with colonoscopy. Pt can hold eliquis 3-5 days prior to procedure. Will have NP sign on behalf of MD since MD is out and then fax for the patient.

## 2024-02-04 DIAGNOSIS — Z1211 Encounter for screening for malignant neoplasm of colon: Secondary | ICD-10-CM | POA: Diagnosis not present

## 2024-02-04 DIAGNOSIS — Z1212 Encounter for screening for malignant neoplasm of rectum: Secondary | ICD-10-CM | POA: Diagnosis not present

## 2024-03-18 ENCOUNTER — Other Ambulatory Visit (HOSPITAL_COMMUNITY): Payer: Self-pay

## 2024-03-25 DIAGNOSIS — I6930 Unspecified sequelae of cerebral infarction: Secondary | ICD-10-CM | POA: Diagnosis not present

## 2024-03-25 DIAGNOSIS — R609 Edema, unspecified: Secondary | ICD-10-CM | POA: Diagnosis not present

## 2024-03-25 DIAGNOSIS — E1169 Type 2 diabetes mellitus with other specified complication: Secondary | ICD-10-CM | POA: Diagnosis not present

## 2024-03-25 DIAGNOSIS — R569 Unspecified convulsions: Secondary | ICD-10-CM | POA: Diagnosis not present

## 2024-03-25 DIAGNOSIS — Z86711 Personal history of pulmonary embolism: Secondary | ICD-10-CM | POA: Diagnosis not present

## 2024-03-25 DIAGNOSIS — I10 Essential (primary) hypertension: Secondary | ICD-10-CM | POA: Diagnosis not present

## 2024-03-25 DIAGNOSIS — N183 Chronic kidney disease, stage 3 unspecified: Secondary | ICD-10-CM | POA: Diagnosis not present

## 2024-06-21 ENCOUNTER — Other Ambulatory Visit: Payer: Self-pay | Admitting: Neurology

## 2024-09-13 ENCOUNTER — Other Ambulatory Visit: Payer: Self-pay | Admitting: Neurology

## 2024-10-02 DIAGNOSIS — L309 Dermatitis, unspecified: Secondary | ICD-10-CM | POA: Diagnosis not present

## 2024-10-02 DIAGNOSIS — Z86711 Personal history of pulmonary embolism: Secondary | ICD-10-CM | POA: Diagnosis not present

## 2024-10-02 DIAGNOSIS — R945 Abnormal results of liver function studies: Secondary | ICD-10-CM | POA: Diagnosis not present

## 2024-10-02 DIAGNOSIS — N1831 Chronic kidney disease, stage 3a: Secondary | ICD-10-CM | POA: Diagnosis not present

## 2024-10-02 DIAGNOSIS — E1169 Type 2 diabetes mellitus with other specified complication: Secondary | ICD-10-CM | POA: Diagnosis not present

## 2024-10-02 DIAGNOSIS — Z0000 Encounter for general adult medical examination without abnormal findings: Secondary | ICD-10-CM | POA: Diagnosis not present

## 2024-10-02 DIAGNOSIS — I10 Essential (primary) hypertension: Secondary | ICD-10-CM | POA: Diagnosis not present

## 2024-10-02 DIAGNOSIS — L989 Disorder of the skin and subcutaneous tissue, unspecified: Secondary | ICD-10-CM | POA: Diagnosis not present

## 2024-10-02 DIAGNOSIS — E785 Hyperlipidemia, unspecified: Secondary | ICD-10-CM | POA: Diagnosis not present

## 2024-10-22 ENCOUNTER — Encounter: Payer: Self-pay | Admitting: Neurology

## 2024-10-22 ENCOUNTER — Ambulatory Visit: Payer: Medicare Other | Admitting: Neurology

## 2024-10-22 VITALS — BP 167/102 | HR 87 | Ht 69.0 in | Wt 170.8 lb

## 2024-10-22 DIAGNOSIS — Z8673 Personal history of transient ischemic attack (TIA), and cerebral infarction without residual deficits: Secondary | ICD-10-CM

## 2024-10-22 DIAGNOSIS — I69398 Other sequelae of cerebral infarction: Secondary | ICD-10-CM

## 2024-10-22 DIAGNOSIS — R252 Cramp and spasm: Secondary | ICD-10-CM | POA: Diagnosis not present

## 2024-10-22 DIAGNOSIS — Z8669 Personal history of other diseases of the nervous system and sense organs: Secondary | ICD-10-CM | POA: Diagnosis not present

## 2024-10-22 MED ORDER — LAMOTRIGINE 100 MG PO TABS: 100.0000 mg | ORAL_TABLET | Freq: Two times a day (BID) | ORAL | 3 refills | Status: AC

## 2024-10-22 NOTE — Progress Notes (Signed)
 Patient: Fernando Rhodes Date of Birth: 1956-10-15  Reason for Visit: stroke follow-up  History from: Patient, brother in law, Fernando Rhodes Primary Neurologist: Dr.Avyaan Summer      HISTORY OF PRESENT ILLNESS: Update 10/22/2024 : He returns for follow-up today accompanied by his brother-in-law following last visit a year ago.  He states he is doing well.  He has had no recurrent stroke or TIA symptoms.  He remains on Eliquis  which is tolerating well without significant bruising or bleeding.  States his blood pressure is quite well-controlled at home though he has whitecoat hypertension and today it is elevated in office at 167/102.  He remains on Zocor  which he is tolerating well without muscle aches and pains.  He had lab work done at last visit which had shown elevated LDL of 79 and triglycerides 282 and hemoglobin A1c 6.9.  He has since seen his primary care physician and has adjusted his medications.  He had some recent lab work done 2 weeks ago which apparently was satisfactory but I did not results to review today.  Did have carotid ultrasound on 11/16/2023 which showed no significant extracranial stenosis.  Patient has not had any further seizures since her original seizure 2 years ago.  He remains on Lamictal  100 mg twice daily which he is tolerating well without side effects.  He has had no new health issues.  He denies any new complaints today. Update 10/23/2023 he returns for follow-up after last visit with nurse practitioner 6 months ago.  [name].  Patient states he is doing quite well.  He has no respiratory deficit from his stroke.  [name].  Remains on Eliquis  which is tolerating well with minor bruising and no bleeding.  He remains on Lamictal  100 mg twice daily which is tolerating well without side effects.  He has had no recurrent seizures or seizure-like episodes.  States his blood pressure is in the very good control  and his sugars are doing well as well.  He is tolerating Zocor  well without muscle aches and pains.  He has not had any lipid panel in more than a year. He has no new complaints.  Prior visit 04/10/23 Fernando Fernando Born, NP )He is on Eliquis  5 mg twice daily. He was on Pradaxa  but his insurance won't pay for it. On Lamictal  100 mg BID, no seizures reported. Some question of if Eliquis  is causing mood swings in his wife's opinion. He is trying to get disability. He has never driven. He feels he is back to normal. He mows the grass, cleans the house. He will be 67 in September, hoping Pradaxa  will be covered. His wife felt his memory was better on Pradaxa . He doesn't understand things the way he used to. He gets frustrated with himself.   Update 10/10/2022 Dr. Rosemarie : He returns for follow-up after last visit 3 months ago.  [name].  He has transitioned from Eliquis  to Pradaxa  and seems to be tolerating it well without bleeding or bruising.  [employer] his insurance company has suggested changing to generic Pradaxa  when it becomes available in January.  Patient was also tolerated switching from Keppra  to Lamictal  100 twice daily which she is now tolerating well without side effects.  Family feels that his moodiness is a lot improved since stopping Keppra .  His blood pressure is under good control today is 125/85.  His sugars are also well controlled  and last A1c was 6.6.  Patient had MRI scan of the brain on 06/23/2022 which showed expected evolutionary changes in the left anterior frontal MCA branch infarct as well as old remote age large MCA and posterior left MCA branch.  EEG on 07/20/2022 was normal.   Update 06/15/22 SS: Here today for earlier visit. 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 They don't understand his limitations. The previous owners sold. [name] had issues before the  stroke. Since his stroke, the car wash implemented a computer system, he can't operate. Since stroke, changes in his behavior, he is more forgetful. Needs help managing medication, double dosed 1 time, gets agitated/irritating with any questioning. His left side has always been his weak side, question if feeling weaker, could be because not about work/sitting around more, hasn't worked since end of April. His work claimed he has slowed down. As a child, he had prolonged fever illness that left him with left sided deficits, brain injury. He didn't want to complete MOCA testing today.  Tearful about not being able to contribute financially.  He remains on Keppra , Pradaxa .  Update 03/14/22 SS; Fernando Rhodes here today for follow-up, 02/09/22 woke from sleep because of leg cramp, had witnessed seizure was full body shaking, snoring respirations. When EMS arrived, left-sided facial droop and weakness.  CT head showed small SAH, likely etiology of seizure, given IV Keppra .  Left-sided weakness chronic since childhood, but left-sided facial droop was new.  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  Here today with brother in law, Fernando Rhodes. Still has chronic left sided weakness, facial droop resolved. Is back to baseline. Lives with wife, he never had drivers license. Remains on Keppra  500 mg twice daily. Tolerating well. No seizures. 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 Not taking BP medication currently. Has seen PCP. Denies headaches. Doing well, back at baseline.   -MRI showed small acute left frontal cortical/subcortical infarct and bilateral punctate cerebellar acute infarcts.  Bilateral MCA old infarcts.  -CTA head and neck unremarkable.   -Repeat CT head showed stable appearance of right MCA infarct and encephalomalacia.  Focal hyperdensity along the superomedial  aspect of the encephalomalacia likely represents calcification related to prior infarcts, there is no definite blood products seen -Routine EEG was normal -2D echo EF 60 to 65% -LDL 71 -A1c 5.7  HISTORY  Copied Dr. Jerri note 02/11/22: Mr. Callaghan Laverdure is a 68 y.o. 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 He presented with left facial droop and left sided weakness especially in left hand. Wife noted generalized shaking and snoring like breathing, likely a seizure. He takes Eliquis  for history of blood clot and wife says they make sure he is compliant. Patient was found to have SAH and likely first time seizure, loaded with levetiracetam  1,000mg  then continued on maintenance 500 mg BID. Routine EEG wnl. Left sided weakness is chronic since childhood, confirmed by sister, but L facial droop is new.   REVIEW OF SYSTEMS: Out of a complete 14 system review of symptoms, the patient complains only of the following  symptoms, and all other reviewed systems are negative.  See HPI  ALLERGIES: No Known Allergies  HOME MEDICATIONS: Outpatient Medications Prior to Visit  Medication Sig Dispense Refill   acetaminophen  (TYLENOL ) 500 MG tablet Take 1,000 mg by mouth every 6 (six) hours as needed for headache (pain).     amLODipine  (NORVASC ) 10 MG tablet Take 1 tablet (10 mg total) by mouth daily. 90 tablet 3   apixaban  (ELIQUIS ) 5 MG TABS tablet Take 1 tablet by mouth twice daily 60 tablet 11   FERROUS SULFATE  PO Take 1 tablet by mouth daily. Unsure of strength     JARDIANCE 25 MG TABS tablet Take 25 mg by mouth daily.     lamoTRIgine  (LAMICTAL ) 100 MG tablet Take 1 tablet (100 mg total) by mouth 2 (two) times daily. 180 tablet 3   metFORMIN  (GLUCOPHAGE ) 1000 MG tablet Take 1 tablet (1,000 mg total) by mouth 2 (two) times daily with a meal. 180 tablet 3   Multiple Vitamins-Minerals (MULTIVITAMIN  GUMMIES ADULT PO) Take 2 tablets by mouth daily. Take 2 gummies daily     simvastatin  (ZOCOR ) 20 MG tablet Take 1 tablet (20 mg total) by mouth daily. 90 tablet 2   amLODipine  (NORVASC ) 10 MG tablet Take 1 tablet (10 mg total) by mouth daily. (Patient not taking: Reported on 10/22/2024) 270 tablet 0   lamoTRIgine  (LAMICTAL ) 100 MG tablet Take 1 tablet by mouth twice daily (Patient not taking: Reported on 10/22/2024) 180 tablet 3   metFORMIN  (GLUCOPHAGE -XR) 500 MG 24 hr tablet Take 2 tablets (1,000 mg total) by mouth 2 (two) times daily with a meal. (Patient not taking: Reported on 10/22/2024) 360 tablet 3   No facility-administered medications prior to visit.    PAST MEDICAL HISTORY: Past Medical History:  Diagnosis Date   Basal cell carcinoma of skin 03/14/2022   Diabetes mellitus without complication (HCC)    Hypertension    Renal insufficiency 06/01/2020    PAST SURGICAL HISTORY: Past Surgical History:  Procedure Laterality Date   ABDOMINAL SURGERY     IR US  GUIDE BX ASP/DRAIN  06/01/2020    FAMILY HISTORY: History reviewed. No pertinent family history.  SOCIAL HISTORY: Social History   Socioeconomic History   Marital status: Single    Spouse name: Not on file   Number of children: Not on file   Years of education: Not on file   Highest education level: Not on file  Occupational History   Not on file  Tobacco Use   Smoking status: Never   Smokeless tobacco: Never  Substance and Sexual Activity   Alcohol use: No   Drug use: No   Sexual activity: Not on file  Other Topics Concern   Not on file  Social History Narrative   Not on file   Social Drivers of Health   Financial Resource Strain: Not on file  Food Insecurity: Not on file  Transportation Needs: Not on file  Physical Activity: Not on file  Stress: Not on file  Social Connections: Unknown (04/25/2022)   Received from [facility]   Social Network    Social Network: Not on file  Intimate Partner  Violence: Unknown (03/17/2022)   Received from [facility]   HITS    Physically Hurt: Not on file    Insult or Talk Down To: Not on file    Threaten Physical Harm: Not on file    Scream or Curse: Not on file   PHYSICAL EXAM  Vitals:  10/22/24 1355  BP: (!) 167/102  Pulse: 87  SpO2: 96%  Weight: 170 lb 12.8 oz (77.5 kg)  Height: 5' 9 (1.753 m)   Body mass index is 25.22 kg/m.  Generalized: Well developed, in no acute distress  Neurological examination  Mentation: Alert oriented to time, place, diminished attention registration and recall.  Follows all commands, speech is slightly dysarthric, but is understandable.  Seems happy, upbeat. Cranial nerve II-XII: Mild asymmetry to left eye compared to right. Pupils were equal round reactive to light. Extraocular movements were full, visual field were full on confrontational test. Facial sensation and strength were normal. Head turning and shoulder shrug  were normal and symmetric. Smile appears very slight droop on left. Motor: Mild weakness to the left arm and leg 4/5, atrophy of left arm noted, flexion of fingers.  Increased tone with mild spasticity in the left arm and left leg. Sensory: Decreased sensation to left face, arm, leg Coordination: Cerebellar testing reveals good finger-nose-finger and heel-to-shin bilaterally.  Gait and station: Gait is slightly wide-based, slightly more limp on the left that noted before, but is independent Reflexes: Deep tendon reflexes are symmetric but increased on the left  DIAGNOSTIC DATA (LABS, IMAGING, TESTING) - I reviewed patient records, labs, notes, testing and imaging myself where available.  Lab Results  Component Value Date   WBC 7.9 02/10/2022   HGB 13.9 02/10/2022   HCT 41.0 02/10/2022   MCV 95.7 02/10/2022   PLT 200 02/10/2022      Component Value Date/Time   NA 139 02/11/2022 0430   K 3.3 (L) 02/11/2022 0430   CL 105 02/11/2022 0430   CO2 24 02/11/2022 0430   GLUCOSE 109  (H) 02/11/2022 0430   BUN 11 02/11/2022 0430   CREATININE 1.03 02/11/2022 0430   CALCIUM  8.3 (L) 02/11/2022 0430   PROT 6.4 (L) 02/10/2022 0139   ALBUMIN 3.6 02/10/2022 0139   AST 24 02/10/2022 0139   ALT 15 02/10/2022 0139   ALKPHOS 78 02/10/2022 0139   BILITOT 0.6 02/10/2022 0139   GFRNONAA >60 02/11/2022 0430   GFRAA >60 06/04/2020 0422   Lab Results  Component Value Date   CHOL 174 10/23/2023   HDL 49 10/23/2023   LDLCALC 79 10/23/2023   TRIG 282 (H) 10/23/2023   CHOLHDL 3.6 10/23/2023   Lab Results  Component Value Date   HGBA1C 6.9 (H) 10/23/2023   No results found for: VITAMINB12 No results found for: TSH  ASSESSMENT AND PLAN 68 y.o. year old male   1. 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 Seizure, single seizure 02/10/22, at high risk for recurrent seizure with history of stroke, TBI 3.  History of prolonged febrile illness as a child with subsequent traumatic brain injury, chronic left-sided weakness, intellectual disability 4.  History of DVT, PE  -I had a long d/w patient  and his brother in law about his remote stroke, paroxysmal atrial fibrillation,risk for recurrent stroke/TIAs, personally independently reviewed imaging studies and stroke evaluation results and answered questions.Continue Eliquis  (apixaban ) daily  for secondary stroke prevention and maintain strict control of hypertension with blood pressure goal below 130/90, diabetes with hemoglobin A1c goal below 6.5% and lipids with LDL cholesterol goal below 70 mg/dL. I also advised the patient to eat a healthy diet with plenty of whole grains, cereals, fruits and vegetables, exercise regularly and maintain ideal body weight . Continue Lamictal   100 mg twice daily for seizure prophylaxis.Followup in the future with my NP  in 1 year or call earlier if needed.     I personally spent a total of 35 minutes in the care of the patient today including getting/reviewing separately obtained  history, performing a medically appropriate exam/evaluation, counseling and educating, placing orders, referring and communicating with other health care professionals, documenting clinical information in the EHR, independently interpreting results, and coordinating care.        Eather Popp, MD11/11/2024, 2:12 PM [facility] [address] [place], [place] [address] [phone]

## 2024-10-22 NOTE — Patient Instructions (Signed)
 I had a long d/w patient  and his brother in law about his remote stroke, paroxysmal atrial fibrillation,risk for recurrent stroke/TIAs, personally independently reviewed imaging studies and stroke evaluation results and answered questions.Continue Eliquis  (apixaban ) daily  for secondary stroke prevention and maintain strict control of hypertension with blood pressure goal below 130/90, diabetes with hemoglobin A1c goal below 6.5% and lipids with LDL cholesterol goal below 70 mg/dL. I also advised the patient to eat a healthy diet with plenty of whole grains, cereals, fruits and vegetables, exercise regularly and maintain ideal body weight . Continue Lamictal   100 mg twice daily for seizure prophylaxis.Followup in the future with my NP in 1 year or call earlier if needed.

## 2024-10-29 ENCOUNTER — Other Ambulatory Visit: Payer: Self-pay | Admitting: Neurology

## 2024-10-29 NOTE — Telephone Encounter (Signed)
 Last seen on 10/22/24 No 1 year follow up scheduled
